# Patient Record
Sex: Female | Born: 1995 | Race: Black or African American | Hispanic: No | Marital: Single | Smoking: Former smoker
Health system: Southern US, Community
[De-identification: ages and names within clinical notes are randomized; demographics above are authoritative.]

## PROBLEM LIST (undated history)

## (undated) ENCOUNTER — Inpatient Hospital Stay (HOSPITAL_COMMUNITY): Payer: Self-pay

## (undated) DIAGNOSIS — F32A Depression, unspecified: Secondary | ICD-10-CM

## (undated) DIAGNOSIS — R87629 Unspecified abnormal cytological findings in specimens from vagina: Secondary | ICD-10-CM

## (undated) DIAGNOSIS — K5090 Crohn's disease, unspecified, without complications: Secondary | ICD-10-CM

## (undated) DIAGNOSIS — M0800 Unspecified juvenile rheumatoid arthritis of unspecified site: Secondary | ICD-10-CM

## (undated) DIAGNOSIS — O039 Complete or unspecified spontaneous abortion without complication: Secondary | ICD-10-CM

## (undated) DIAGNOSIS — K649 Unspecified hemorrhoids: Secondary | ICD-10-CM

## (undated) DIAGNOSIS — F419 Anxiety disorder, unspecified: Secondary | ICD-10-CM

## (undated) DIAGNOSIS — F329 Major depressive disorder, single episode, unspecified: Secondary | ICD-10-CM

## (undated) DIAGNOSIS — B999 Unspecified infectious disease: Secondary | ICD-10-CM

## (undated) HISTORY — DX: Depression, unspecified: F32.A

## (undated) HISTORY — DX: Anxiety disorder, unspecified: F41.9

## (undated) HISTORY — DX: Unspecified hemorrhoids: K64.9

## (undated) HISTORY — PX: TONSILLECTOMY: SUR1361

## (undated) HISTORY — DX: Major depressive disorder, single episode, unspecified: F32.9

## (undated) HISTORY — DX: Unspecified juvenile rheumatoid arthritis of unspecified site: M08.00

## (undated) HISTORY — DX: Complete or unspecified spontaneous abortion without complication: O03.9

---
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
## 2015-06-03 ENCOUNTER — Ambulatory Visit (INDEPENDENT_AMBULATORY_CARE_PROVIDER_SITE_OTHER): Payer: BC Managed Care – PPO | Admitting: Family Medicine

## 2015-06-03 ENCOUNTER — Encounter: Payer: Self-pay | Admitting: Family Medicine

## 2015-06-03 VITALS — BP 120/76 | HR 74 | Temp 98.5°F | Resp 16 | Ht 67.25 in | Wt 218.0 lb

## 2015-06-03 DIAGNOSIS — Z3201 Encounter for pregnancy test, result positive: Secondary | ICD-10-CM

## 2015-06-03 DIAGNOSIS — G4489 Other headache syndrome: Secondary | ICD-10-CM

## 2015-06-03 DIAGNOSIS — K2970 Gastritis, unspecified, without bleeding: Secondary | ICD-10-CM | POA: Diagnosis not present

## 2015-06-03 LAB — PREGNANCY, URINE: Preg Test, Ur: POSITIVE

## 2015-06-03 MED ORDER — PROMETHAZINE HCL 12.5 MG PO TABS: 12.5000 mg | ORAL_TABLET | Freq: Three times a day (TID) | ORAL | Status: DC | PRN

## 2015-06-03 MED ORDER — NORGESTIMATE-ETH ESTRADIOL 0.25-35 MG-MCG PO TABS: 1.0000 | ORAL_TABLET | Freq: Every day | ORAL | Status: DC

## 2015-06-03 MED ORDER — ESOMEPRAZOLE MAGNESIUM 20 MG PO CPDR: 20.0000 mg | DELAYED_RELEASE_CAPSULE | Freq: Every day | ORAL | Status: DC

## 2015-06-03 MED ORDER — PROMETHAZINE HCL 25 MG/ML IJ SOLN
25.0000 mg | Freq: Once | INTRAMUSCULAR | Status: AC
  Administered 2015-06-03: 25 mg via INTRAMUSCULAR

## 2015-06-03 NOTE — Patient Instructions (Addendum)
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

## 2015-06-03 NOTE — Progress Notes (Signed)
Patient ID: Deanna [name], female   DOB: [birth year], 19 y.o.   MRN: [id number]      Subjective:    Patient ID: Deanna [name], female    DOB: May 18, 1996, 19 y.o.   MRN: [id number]  Patient presents for Illness  she here with epigastric discomfort and nausea vomiting this light she is mostly vomiting stomach acid for the past 3-4 days. She denies any fever. Has had problems with acid reflux in the past. She does have a history of Crohn's disease but has not had any change in her weight no change in her bowels she denies any dysuria. She is no longer taking her Crohn's medication or her juvenile arthritis medications. She did take one tablet of ibuprofen a week ago but for some knee pain but is not taking any anti-inflammatory since then. She had a headache one day but this was after she was vomiting.  LMP- August - early   At the end of the visit patient and mother asked if she could be put on birth control she states that she is not sexually active   Review Of Systems:  GEN- denies fatigue, fever, weight loss,weakness, recent illness HEENT- denies eye drainage, change in vision, nasal discharge, CVS- denies chest pain, palpitations RESP- denies SOB, cough, wheeze ABD- + N/V, change in stools, +abd pain GU- denies dysuria, hematuria, dribbling, incontinence MSK- denies joint pain, muscle aches, injury Neuro- denies headache, dizziness, syncope, seizure activity       Objective:    BP 120/76 mmHg  Pulse 74  Temp(Src) 98.5 F (36.9 C) (Oral)  Resp 16  Ht 5' 7.25" (1.708 m)  Wt 218 lb (98.884 kg)  BMI 33.90 kg/m2  LMP 04/29/2015 (Approximate) GEN- NAD, alert and oriented x3 HEENT- PERRL, EOMI, non injected sclera, pink conjunctiva, MMM, oropharynx clear Neck- Supple, no LAD CVS- RRR, no murmur RESP-CTAB ABD-NABS,soft,mild TTP epigastric region, ND, no CVA tenderness  EXT- No edema Pulses- Radial,  2+        Assessment & Plan:      Problem List Items Addressed  This Visit    None    Visit Diagnoses    Gastritis    -  Primary    Initially concerned for gastritis, she has had acid reflux on and off in the past, given nexium but then pregnancy test returned positive, therefore advised not to take this      Relevant Medications    promethazine (PHENERGAN) injection 25 mg (Completed)    Other Relevant Orders    CBC with Differential/Platelet    Comprehensive metabolic panel    Other headache syndrome        Positive pregnancy test        After visit, pt able to leave urine sample, positive pregnancy test, obtain US can not give accurate LMP, mother advised not to give phenergan or Sprintec OCP,     Relevant Orders    Pregnancy, urine (Completed)       Note: This dictation was prepared with Dragon dictation along with smaller phrase technology. Any transcriptional errors that result from this process are unintentional.

## 2015-06-04 ENCOUNTER — Telehealth: Payer: Self-pay | Admitting: *Deleted

## 2015-06-04 ENCOUNTER — Other Ambulatory Visit: Payer: Self-pay | Admitting: *Deleted

## 2015-06-04 LAB — COMPREHENSIVE METABOLIC PANEL
ALT: 4 U/L — ABNORMAL LOW (ref 5–32)
AST: 10 U/L — ABNORMAL LOW (ref 12–32)
Albumin: 4 g/dL (ref 3.6–5.1)
Alkaline Phosphatase: 61 U/L (ref 47–176)
BUN: 9 mg/dL (ref 7–20)
CHLORIDE: 102 mmol/L (ref 98–110)
CO2: 23 mmol/L (ref 20–31)
CREATININE: 0.59 mg/dL (ref 0.50–1.00)
Calcium: 9.6 mg/dL (ref 8.9–10.4)
Glucose, Bld: 83 mg/dL (ref 70–99)
Potassium: 4.1 mmol/L (ref 3.8–5.1)
SODIUM: 136 mmol/L (ref 135–146)
TOTAL PROTEIN: 7.4 g/dL (ref 6.3–8.2)
Total Bilirubin: 0.4 mg/dL (ref 0.2–1.1)

## 2015-06-04 LAB — CBC WITH DIFFERENTIAL/PLATELET
BASOS PCT: 0 % (ref 0–1)
Basophils Absolute: 0 10*3/uL (ref 0.0–0.1)
EOS ABS: 0.1 10*3/uL (ref 0.0–0.7)
Eosinophils Relative: 1 % (ref 0–5)
HCT: 40.4 % (ref 36.0–46.0)
Hemoglobin: 13 g/dL (ref 12.0–15.0)
Lymphocytes Relative: 29 % (ref 12–46)
Lymphs Abs: 2 10*3/uL (ref 0.7–4.0)
MCH: 27.7 pg (ref 26.0–34.0)
MCHC: 32.2 g/dL (ref 30.0–36.0)
MCV: 86 fL (ref 78.0–100.0)
MPV: 10 fL (ref 8.6–12.4)
Monocytes Absolute: 0.7 10*3/uL (ref 0.1–1.0)
Monocytes Relative: 10 % (ref 3–12)
NEUTROS PCT: 60 % (ref 43–77)
Neutro Abs: 4.2 10*3/uL (ref 1.7–7.7)
PLATELETS: 328 10*3/uL (ref 150–400)
RBC: 4.7 MIL/uL (ref 3.87–5.11)
RDW: 13.7 % (ref 11.5–15.5)
WBC: 7 10*3/uL (ref 4.0–10.5)

## 2015-06-04 MED ORDER — CONCEPT DHA 53.5-38-1 MG PO CAPS: 1.0000 | ORAL_CAPSULE | Freq: Every day | ORAL | Status: DC

## 2015-06-04 NOTE — Telephone Encounter (Signed)
Pt has appt scheduled for Friday Sept 16 at [facility] [place] to pt for appt

## 2015-06-05 NOTE — Telephone Encounter (Signed)
LMTRC

## 2015-06-06 NOTE — Telephone Encounter (Signed)
Called both numbers listed lmtrc on both

## 2015-06-07 ENCOUNTER — Other Ambulatory Visit: Payer: Self-pay | Admitting: Family Medicine

## 2015-06-07 ENCOUNTER — Ambulatory Visit (HOSPITAL_COMMUNITY)
Admission: RE | Admit: 2015-06-07 | Discharge: 2015-06-07 | Disposition: A | Discharge status: Home / Self Care | Payer: BC Managed Care – PPO | Source: Ambulatory Visit | Attending: Family Medicine | Admitting: Family Medicine

## 2015-06-07 ENCOUNTER — Ambulatory Visit (HOSPITAL_COMMUNITY): Payer: BC Managed Care – PPO

## 2015-06-07 DIAGNOSIS — Z3A01 Less than 8 weeks gestation of pregnancy: Secondary | ICD-10-CM | POA: Diagnosis not present

## 2015-06-07 DIAGNOSIS — O30041 Twin pregnancy, dichorionic/diamniotic, first trimester: Secondary | ICD-10-CM | POA: Diagnosis present

## 2015-06-07 DIAGNOSIS — Z3201 Encounter for pregnancy test, result positive: Secondary | ICD-10-CM

## 2015-06-07 DIAGNOSIS — O208 Other hemorrhage in early pregnancy: Secondary | ICD-10-CM | POA: Insufficient documentation

## 2015-07-17 ENCOUNTER — Encounter: Payer: Self-pay | Admitting: Family Medicine

## 2015-07-17 ENCOUNTER — Other Ambulatory Visit: Payer: Self-pay | Admitting: Family Medicine

## 2015-07-17 ENCOUNTER — Ambulatory Visit (INDEPENDENT_AMBULATORY_CARE_PROVIDER_SITE_OTHER): Payer: BC Managed Care – PPO | Admitting: Family Medicine

## 2015-07-17 VITALS — BP 132/78 | HR 80 | Temp 98.7°F | Resp 14 | Ht 67.0 in | Wt 224.0 lb

## 2015-07-17 DIAGNOSIS — Z0000 Encounter for general adult medical examination without abnormal findings: Secondary | ICD-10-CM | POA: Diagnosis not present

## 2015-07-17 DIAGNOSIS — Z23 Encounter for immunization: Secondary | ICD-10-CM | POA: Diagnosis not present

## 2015-07-17 DIAGNOSIS — Z124 Encounter for screening for malignant neoplasm of cervix: Secondary | ICD-10-CM

## 2015-07-17 DIAGNOSIS — Z30011 Encounter for initial prescription of contraceptive pills: Secondary | ICD-10-CM

## 2015-07-17 DIAGNOSIS — Z113 Encounter for screening for infections with a predominantly sexual mode of transmission: Secondary | ICD-10-CM | POA: Diagnosis not present

## 2015-07-17 LAB — PREGNANCY, URINE: PREG TEST UR: NEGATIVE

## 2015-07-17 LAB — WET PREP FOR TRICH, YEAST, CLUE
TRICH WET PREP: NONE SEEN
Yeast Wet Prep HPF POC: NONE SEEN

## 2015-07-17 MED ORDER — NORGESTIMATE-ETH ESTRADIOL 0.25-35 MG-MCG PO TABS: 1.0000 | ORAL_TABLET | Freq: Every day | ORAL | Status: DC

## 2015-07-17 NOTE — Progress Notes (Signed)
Patient ID: Deanna [name], female   DOB: 1996/08/04, 19 y.o.   MRN: [id number]   Subjective:    Patient ID: Deanna [name], female    DOB: Oct 20, 1995, 19 y.o.   MRN: [id number]  Patient presents for CPE  patient here for complete physical exam. Her last visit she was found to be pregnant with twins. She had an abortion at a local clinic. She was given oral medication for this procedure. She did not have her follow-up examination she missed this. After she bled from the abortion treatment she did have a menstrual cycle which came on October 9. He would like to start birth control she has been sexually active in the past month with different partners. She also requests STD check. She has been using condoms.  She is currently finishing up her associates degree and will transfer to [employer] next fall. Her immunizations were reviewed she is due for hepatitis A her second HPV and flu shot.  No illicit drug use.No ETOH    Review Of Systems:  GEN- denies fatigue, fever, weight loss,weakness, recent illness HEENT- denies eye drainage, change in vision, nasal discharge, CVS- denies chest pain, palpitations RESP- denies SOB, cough, wheeze ABD- denies N/V, change in stools, abd pain GU- denies dysuria, hematuria, dribbling, incontinence MSK- denies joint pain, muscle aches, injury Neuro- denies headache, dizziness, syncope, seizure activity       Objective:    BP 132/78 mmHg  Pulse 80  Temp(Src) 98.7 F (37.1 C) (Oral)  Resp 14  Ht 5' 7"  (1.702 m)  Wt 224 lb (101.606 kg)  BMI 35.08 kg/m2 GEN- NAD, alert and oriented x3 HEENT- PERRL, EOMI, non injected sclera, pink conjunctiva, MMM, oropharynx clear Neck- Supple, no thyromegaly CVS- RRR, no murmur RESP-CTAB ABD-NABS,soft,NT,ND GU- normal external genitalia, vaginal mucosa pink and moist, cervix visualized no growth, no blood form os, minimal thin clear discharge, no CMT, no ovarian masses, uterus normal size EXT- No  edema Pulses- Radial, DP- 2+  Upreg- NEGATIVE      Assessment & Plan:      Problem List Items Addressed This Visit    None    Visit Diagnoses    Routine general medical examination at a health care facility    -  Primary    CPE done, PAP Smear done, s/p abortion, cultures taken, will start OCP , HPV #2, Hep A #2 given Discussed high risk sexual behavior, use of condoms    Relevant Medications    norgestimate-ethinyl estradiol (SPRINTEC 28) 0.25-35 MG-MCG tablet    Other Relevant Orders    GC/Chlamydia Probe Amp    WET PREP FOR TRICH, YEAST, CLUE (Completed)    PAP, ThinPrep ASCUS Rflx HPV Rflx Type    HIV antibody (with reflex)    RPR    Pregnancy, urine (Completed)    Flu Vaccine QUAD 36+ mos PF IM (Fluarix & Fluzone Quad PF) (Completed)    Hepatitis A vaccine pediatric / adolescent 2 dose IM (Completed)    HPV 9-valent vaccine,Recombinat (Gardasil 9) (Completed)    Cervical cancer screening        Relevant Medications    norgestimate-ethinyl estradiol (SPRINTEC 28) 0.25-35 MG-MCG tablet    Other Relevant Orders    GC/Chlamydia Probe Amp    WET PREP FOR TRICH, YEAST, CLUE (Completed)    PAP, ThinPrep ASCUS Rflx HPV Rflx Type    HIV antibody (with reflex)    RPR    Pregnancy, urine (Completed)  Flu Vaccine QUAD 36+ mos PF IM (Fluarix & Fluzone Quad PF) (Completed)    Hepatitis A vaccine pediatric / adolescent 2 dose IM (Completed)    HPV 9-valent vaccine,Recombinat (Gardasil 9) (Completed)    Screen for STD (sexually transmitted disease)        Relevant Medications    norgestimate-ethinyl estradiol (SPRINTEC 28) 0.25-35 MG-MCG tablet    Other Relevant Orders    GC/Chlamydia Probe Amp    WET PREP FOR TRICH, YEAST, CLUE (Completed)    PAP, ThinPrep ASCUS Rflx HPV Rflx Type    HIV antibody (with reflex)    RPR    Pregnancy, urine (Completed)    Flu Vaccine QUAD 36+ mos PF IM (Fluarix & Fluzone Quad PF) (Completed)    Hepatitis A vaccine pediatric / adolescent 2  dose IM (Completed)    HPV 9-valent vaccine,Recombinat (Gardasil 9) (Completed)    Encounter for initial prescription of contraceptive pills        Relevant Medications    norgestimate-ethinyl estradiol (SPRINTEC 28) 0.25-35 MG-MCG tablet    Other Relevant Orders    GC/Chlamydia Probe Amp    WET PREP FOR TRICH, YEAST, CLUE (Completed)    PAP, ThinPrep ASCUS Rflx HPV Rflx Type    HIV antibody (with reflex)    RPR    Pregnancy, urine (Completed)    Flu Vaccine QUAD 36+ mos PF IM (Fluarix & Fluzone Quad PF) (Completed)    Hepatitis A vaccine pediatric / adolescent 2 dose IM (Completed)    HPV 9-valent vaccine,Recombinat (Gardasil 9) (Completed)    Need for prophylactic vaccination and inoculation against influenza        Relevant Medications    norgestimate-ethinyl estradiol (SPRINTEC 28) 0.25-35 MG-MCG tablet    Other Relevant Orders    GC/Chlamydia Probe Amp    WET PREP FOR TRICH, YEAST, CLUE (Completed)    PAP, ThinPrep ASCUS Rflx HPV Rflx Type    HIV antibody (with reflex)    RPR    Pregnancy, urine (Completed)    Flu Vaccine QUAD 36+ mos PF IM (Fluarix & Fluzone Quad PF) (Completed)    Hepatitis A vaccine pediatric / adolescent 2 dose IM (Completed)    HPV 9-valent vaccine,Recombinat (Gardasil 9) (Completed)    Need for prophylactic vaccination and inoculation against viral hepatitis        Relevant Medications    norgestimate-ethinyl estradiol (SPRINTEC 28) 0.25-35 MG-MCG tablet    Other Relevant Orders    GC/Chlamydia Probe Amp    WET PREP FOR TRICH, YEAST, CLUE (Completed)    PAP, ThinPrep ASCUS Rflx HPV Rflx Type    HIV antibody (with reflex)    RPR    Pregnancy, urine (Completed)    Flu Vaccine QUAD 36+ mos PF IM (Fluarix & Fluzone Quad PF) (Completed)    Hepatitis A vaccine pediatric / adolescent 2 dose IM (Completed)    HPV 9-valent vaccine,Recombinat (Gardasil 9) (Completed)    Need for prophylactic vaccination and inoculation against single disease        Relevant  Medications    norgestimate-ethinyl estradiol (SPRINTEC 28) 0.25-35 MG-MCG tablet    Other Relevant Orders    GC/Chlamydia Probe Amp    WET PREP FOR TRICH, YEAST, CLUE (Completed)    PAP, ThinPrep ASCUS Rflx HPV Rflx Type    HIV antibody (with reflex)    RPR    Pregnancy, urine (Completed)    Flu Vaccine QUAD 36+ mos PF IM (Fluarix & Fluzone Quad PF) (Completed)  Hepatitis A vaccine pediatric / adolescent 2 dose IM (Completed)    HPV 9-valent vaccine,Recombinat (Gardasil 9) (Completed)       Note: This dictation was prepared with Dragon dictation along with smaller phrase technology. Any transcriptional errors that result from this process are unintentional.

## 2015-07-17 NOTE — Patient Instructions (Signed)
We will call with lab results Start birth control Hepatitis shot, HPV and flu shot given F/U as needed or in 1 year

## 2015-07-18 LAB — GC/CHLAMYDIA PROBE AMP
CT PROBE, AMP APTIMA: NEGATIVE
GC PROBE AMP APTIMA: NEGATIVE

## 2015-07-18 LAB — HIV ANTIBODY (ROUTINE TESTING W REFLEX): HIV: NONREACTIVE

## 2015-07-18 LAB — RPR

## 2015-07-19 LAB — PAP THINPREP ASCUS RFLX HPV RFLX TYPE

## 2015-07-24 LAB — HUMAN PAPILLOMAVIRUS, HIGH RISK: HPV DNA High Risk: DETECTED — AB

## 2016-01-08 ENCOUNTER — Ambulatory Visit
Admission: RE | Admit: 2016-01-08 | Discharge: 2016-01-08 | Disposition: A | Discharge status: Home / Self Care | Payer: BC Managed Care – PPO | Source: Ambulatory Visit | Attending: Family Medicine | Admitting: Family Medicine

## 2016-01-08 ENCOUNTER — Encounter: Payer: Self-pay | Admitting: Family Medicine

## 2016-01-08 ENCOUNTER — Ambulatory Visit (INDEPENDENT_AMBULATORY_CARE_PROVIDER_SITE_OTHER): Payer: BC Managed Care – PPO | Admitting: Family Medicine

## 2016-01-08 VITALS — BP 118/62 | HR 78 | Temp 99.3°F | Resp 14 | Ht 67.0 in | Wt 224.0 lb

## 2016-01-08 DIAGNOSIS — M25461 Effusion, right knee: Secondary | ICD-10-CM | POA: Diagnosis not present

## 2016-01-08 DIAGNOSIS — M25561 Pain in right knee: Secondary | ICD-10-CM | POA: Diagnosis not present

## 2016-01-08 MED ORDER — DICLOFENAC SODIUM 75 MG PO TBEC: 75.0000 mg | DELAYED_RELEASE_TABLET | Freq: Two times a day (BID) | ORAL | Status: DC

## 2016-01-08 NOTE — Progress Notes (Signed)
Patient ID: AIREANNA LUELLEN, female   DOB: [birth year], 20 y.o.   MRN: [id number]   Subjective:    Patient ID: Florentina Addison, female    DOB: Sep 13, 1996, 20 y.o.   MRN: [id number]  Patient presents for R Knee Pain   RIght knee pain x 1 day, working at [employer] , does a lot of standing and walking, no injury Noticed swelling when she got off work Now pain at back of knee And on the top of her knee. He does recall having some knee pain a few weeks ago but at that time did not have any specific injury either. She denies any fever no recent symptoms. Took TYlenol  for pain.      Review Of Systems:  GEN- denies fatigue, fever, weight loss,weakness, recent illness HEENT- denies eye drainage, change in vision, nasal discharge, CVS- denies chest pain, palpitations RESP- denies SOB, cough, wheeze ABD- denies N/V, change in stools, abd pain GU- denies dysuria, hematuria, dribbling, incontinence MSK- + joint pain, muscle aches, injury Neuro- denies headache, dizziness, syncope, seizure activity       Objective:    BP 118/62 mmHg  Pulse 78  Temp(Src) 99.3 F (37.4 C) (Oral)  Resp 14  Ht 5' 7"  (1.702 m)  Wt 224 lb (101.606 kg)  BMI 35.08 kg/m2 GEN- NAD, alert and oriented x3 MSK- Right knee- + effusion, no erythenma, no warmth, TTP Ant knee and inferior patella, NT in popliteal region, neg lachmans, ligaments in tact, pain with flexion of knee, unable to completely extend.  Ankle FROM no effusion Left knee- normal inspection, FROM         Assessment & Plan:      Problem List Items Addressed This Visit    None    Visit Diagnoses    Knee pain, acute, right    -  Primary    knee pain , no specific injury, xray shows effusion otherwise normal joint. Treat with diclofenac once a day, as she has history of inflammatory bowel disorder.ICE and elevate  If swelling does not go down send to Ortho ? Sprain of knee    Relevant Orders    DG Knee Complete 4 Views Right (Completed)     Knee effusion, right        Relevant Orders    DG Knee Complete 4 Views Right (Completed)       Note: This dictation was prepared with Dragon dictation along with smaller phrase technology. Any transcriptional errors that result from this process are unintentional.

## 2016-01-08 NOTE — Patient Instructions (Addendum)
Ice knee and elevate  You can wear a brace with knee cut out  Take diclofenac twice a day with food for 2 weeks Get xray done  [address]  [facility]) F/U pending results

## 2016-01-20 ENCOUNTER — Ambulatory Visit: Payer: Self-pay | Admitting: Family Medicine

## 2016-01-22 ENCOUNTER — Encounter: Payer: Self-pay | Admitting: Family Medicine

## 2016-01-22 ENCOUNTER — Ambulatory Visit (INDEPENDENT_AMBULATORY_CARE_PROVIDER_SITE_OTHER): Payer: BC Managed Care – PPO | Admitting: Family Medicine

## 2016-01-22 VITALS — BP 128/68 | HR 72 | Temp 99.1°F | Resp 18 | Ht 67.0 in | Wt 228.0 lb

## 2016-01-22 DIAGNOSIS — Z309 Encounter for contraceptive management, unspecified: Secondary | ICD-10-CM | POA: Insufficient documentation

## 2016-01-22 DIAGNOSIS — Z3041 Encounter for surveillance of contraceptive pills: Secondary | ICD-10-CM

## 2016-01-22 DIAGNOSIS — Z3200 Encounter for pregnancy test, result unknown: Secondary | ICD-10-CM | POA: Diagnosis not present

## 2016-01-22 NOTE — Patient Instructions (Signed)
Try Pamprin or Midol  Continue birth control F/U as needed

## 2016-01-22 NOTE — Progress Notes (Signed)
Patient ID: LORELEE [name], female   DOB: 07/08/1996, 20 y.o.   MRN: [id number]    Subjective:    Patient ID: Deanna [name], female    DOB: 19-Feb-1996, 20 y.o.   MRN: [id number]  Patient presents for Screening Pregnancy Test Patient here for pregnancy test. She is currently on oral contraceptives  she missed some days of her birth control and has been sexually active. Her cycle did come on 4/31, initially it was light now heavy,s he also has cramping and she doesn't usually have cramping.  Note she did have a positive pregnancy test back in September 2016 with twin gestation she had an abortion at that time. Denies vaginal discharge, UTI symptoms    Review Of Systems:  GEN- denies fatigue, fever, weight loss,weakness, recent illness HEENT- denies eye drainage, change in vision, nasal discharge, CVS- denies chest pain, palpitations RESP- denies SOB, cough, wheeze ABD- denies N/V, change in stools, abd pain GU- denies dysuria, hematuria, dribbling, incontinence MSK- denies joint pain, muscle aches, injury Neuro- denies headache, dizziness, syncope, seizure activity       Objective:    BP 128/68 mmHg  Pulse 72  Temp(Src) 99.1 F (37.3 C) (Oral)  Resp 18  Ht 5' 7"  (1.702 m)  Wt 228 lb (103.42 kg)  BMI 35.70 kg/m2  LMP 01/19/2016 GEN- NAD, alert and oriented x3 ABD-NABS,soft,NT,ND         Assessment & Plan:      Problem List Items Addressed This Visit    Contraceptive management    Other Visit Diagnoses    Encounter for pregnancy test    -  Primary    Upreg neg, pt on menses, discussed proper use of BP, she can double up on missed days. Okay to use Pamprin as needed    Relevant Orders    Pregnancy, urine       Note: This dictation was prepared with Dragon dictation along with smaller phrase technology. Any transcriptional errors that result from this process are unintentional.

## 2016-03-23 ENCOUNTER — Ambulatory Visit: Payer: BC Managed Care – PPO | Admitting: Family Medicine

## 2016-03-25 ENCOUNTER — Encounter: Payer: Self-pay | Admitting: Family Medicine

## 2016-03-25 ENCOUNTER — Ambulatory Visit (INDEPENDENT_AMBULATORY_CARE_PROVIDER_SITE_OTHER): Payer: BC Managed Care – PPO | Admitting: Family Medicine

## 2016-03-25 VITALS — BP 130/72 | HR 88 | Temp 98.1°F | Resp 14 | Ht 67.0 in | Wt 226.0 lb

## 2016-03-25 DIAGNOSIS — Z3201 Encounter for pregnancy test, result positive: Secondary | ICD-10-CM

## 2016-03-25 LAB — PREGNANCY, URINE: Preg Test, Ur: POSITIVE — AB

## 2016-03-25 MED ORDER — CONCEPT DHA 53.5-38-1 MG PO CAPS: ORAL_CAPSULE | ORAL | Status: DC

## 2016-03-25 NOTE — Patient Instructions (Signed)
Referral to OB/GYN  B6 for nausea Plenty of water  Avoid- Spicey, avoid fried foods, no hotdogs, avoid deli meats, only whitefish , cooked foods only  F/U as needed

## 2016-03-25 NOTE — Progress Notes (Signed)
Patient ID: Deanna [name], female   DOB: [birth year], 20 y.o.   MRN: [id number]   Subjective:    Patient ID: Deanna [name], female    DOB: 28-Jul-1996, 20 y.o.   MRN: [id number]  Patient presents for Pregnancy Test and GI Issues  Patient here for positive pregnancy test. She has history of Crohn's disease which she's not had any difficulties with the past 3 years. At her last visit we had started her on birth control but she did not take it. She states that this pregnancy was planned between her and her boyfriend she is 61 he is 49. She's been taking over-the-counter prenatal vitamin she has been very nauseous and had a few episodes of emesis. Her last menstrual period was around May 20 or 21st she did have some spotting at the end of June but it was not a true period   Review Of Systems:  GEN- denies fatigue, fever, weight loss,weakness, recent illness HEENT- denies eye drainage, change in vision, nasal discharge, CVS- denies chest pain, palpitations RESP- denies SOB, cough, wheeze ABD-+N/V, change in stools, abd pain GU- denies dysuria, hematuria, dribbling, incontinence MSK- denies joint pain, muscle aches, injury Neuro- denies headache, dizziness, syncope, seizure activity       Objective:    BP 130/72 mmHg  Pulse 88  Temp(Src) 98.1 F (36.7 C) (Oral)  Resp 14  Ht 5' 7"  (1.702 m)  Wt 226 lb (102.513 kg)  BMI 35.39 kg/m2 GEN- NAD, alert and oriented x3 HEENT- PERRL, EOMI, non injected sclera, pink conjunctiva, MMM, oropharynx clear Neck- Supple, no thyromegaly CVS- RRR, no murmur RESP-CTAB EXT-trace edema Pulses- Radial  2+        Assessment & Plan:      Problem List Items Addressed This Visit    None    Visit Diagnoses    Positive pregnancy test    -  Primary    confrimed pregnancy, discussed PNV start Concept, refer to OB, B6 for nausea, water Around 6-[redacted] weeks pregnant    Relevant Orders    Pregnancy, urine (Completed)       Note: This dictation  was prepared with Dragon dictation along with smaller phrase technology. Any transcriptional errors that result from this process are unintentional.

## 2016-03-27 LAB — PREGNANCY, URINE: Preg Test, Ur: NEGATIVE

## 2016-05-05 ENCOUNTER — Encounter: Payer: Self-pay | Admitting: Medical

## 2016-05-05 ENCOUNTER — Ambulatory Visit (INDEPENDENT_AMBULATORY_CARE_PROVIDER_SITE_OTHER): Payer: BC Managed Care – PPO | Admitting: Medical

## 2016-05-05 VITALS — BP 113/75 | HR 112 | Wt 217.2 lb

## 2016-05-05 DIAGNOSIS — Z3491 Encounter for supervision of normal pregnancy, unspecified, first trimester: Secondary | ICD-10-CM

## 2016-05-05 DIAGNOSIS — Z3490 Encounter for supervision of normal pregnancy, unspecified, unspecified trimester: Secondary | ICD-10-CM | POA: Insufficient documentation

## 2016-05-05 DIAGNOSIS — O219 Vomiting of pregnancy, unspecified: Secondary | ICD-10-CM

## 2016-05-05 DIAGNOSIS — Z3481 Encounter for supervision of other normal pregnancy, first trimester: Secondary | ICD-10-CM

## 2016-05-05 LAB — POCT URINALYSIS DIP (DEVICE)
GLUCOSE, UA: NEGATIVE mg/dL
HGB URINE DIPSTICK: NEGATIVE
Nitrite: NEGATIVE
PROTEIN: 30 mg/dL — AB
SPECIFIC GRAVITY, URINE: 1.025 (ref 1.005–1.030)
UROBILINOGEN UA: 1 mg/dL (ref 0.0–1.0)
pH: 6 (ref 5.0–8.0)

## 2016-05-05 MED ORDER — DOXYLAMINE-PYRIDOXINE 10-10 MG PO TBEC: DELAYED_RELEASE_TABLET | ORAL | 1 refills | Status: DC

## 2016-05-05 NOTE — Progress Notes (Signed)
Subjective:  Deanna [name] is a 20 y.o. G2P0010 at 81w2dbeing seen today for initial prenatal visit.  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  Patient reports no complaints.   . Vag. Bleeding: None.  Movement: Absent. Denies leaking of fluid.   The following portions of the patient's history were reviewed and updated as appropriate: allergies, current medications, past family history, past medical history, past social history, past surgical history and problem list. Problem list updated.  Objective:   Vitals:   05/05/16 1439  BP: 113/75  Pulse: (!) 112  Weight: 217 lb 3.2 oz (98.5 kg)    Fetal Status: Fetal Heart Rate (bpm): 153   Movement: Absent     General:  Alert, oriented and cooperative. Patient is in no acute distress.  Skin: Skin is warm and dry. No rash noted.   Cardiovascular: Normal heart rate noted  Respiratory: Normal respiratory effort, no problems with respiration noted  Abdomen: Soft, gravid, appropriate for gestational age. Pain/Pressure: Absent     Pelvic:  Cervical exam deferred        Extremities: Normal range of motion.  Edema: None  Mental Status: Normal mood and affect. Normal behavior. Normal judgment and thought content.   Urinalysis: Urine Protein: 1+ Urine Glucose: Negative  Assessment and Plan:  Pregnancy: G2P0010 at 165w2d1. Supervision of normal pregnancy in first trimester - Culture, OB Urine - Prenatal Profile - Hemoglobinopathy Evaluation - [place] Fetal Nuchal Translucency; scheduled  2. Nausea and vomiting in pregnancy prior to [redacted] weeks gestation - Rx for Diclegis sent to patient's pharmacy  3. Crohn's disease - Patient denies symptoms of flare at this time, not currently on any medication for management, will have her  discuss with MD at next visit  4. JRA - patient states intermittent mild joint pain, denies any current JRA specific medications, will have her discuss this with MD at next visit as well  first trimester warning signs/ symptoms and general obstetric precautions including but not limited to vaginal bleeding, contractions, leaking of fluid and fetal movement were reviewed in detail with the patient. Please refer to After Visit Summary for other counseling recommendations.  Return in about 4 weeks (around 06/02/2016) for HROB with MD.   JuLuvenia ReddenPA-C

## 2016-05-05 NOTE — Patient Instructions (Signed)

## 2016-05-06 LAB — PRENATAL PROFILE (SOLSTAS)
Antibody Screen: NEGATIVE
BASOS ABS: 0 {cells}/uL (ref 0–200)
Basophils Relative: 0 %
EOS ABS: 65 {cells}/uL (ref 15–500)
EOS PCT: 1 %
HCT: 39.2 % (ref 35.0–45.0)
HEMOGLOBIN: 12.7 g/dL (ref 11.7–15.5)
HEP B S AG: NEGATIVE
HIV: NONREACTIVE
LYMPHS ABS: 1885 {cells}/uL (ref 850–3900)
Lymphocytes Relative: 29 %
MCH: 27.9 pg (ref 27.0–33.0)
MCHC: 32.4 g/dL (ref 32.0–36.0)
MCV: 86 fL (ref 80.0–100.0)
MONO ABS: 585 {cells}/uL (ref 200–950)
MPV: 10.7 fL (ref 7.5–12.5)
Monocytes Relative: 9 %
NEUTROS ABS: 3965 {cells}/uL (ref 1500–7800)
Neutrophils Relative %: 61 %
PLATELETS: 287 10*3/uL (ref 140–400)
RBC: 4.56 MIL/uL (ref 3.80–5.10)
RDW: 14.4 % (ref 11.0–15.0)
RUBELLA: 1.59 {index} — AB (ref <0.90)
Rh Type: POSITIVE
WBC: 6.5 10*3/uL (ref 3.8–10.8)

## 2016-05-07 LAB — HEMOGLOBINOPATHY EVALUATION
HEMATOCRIT: 39.2 % (ref 35.0–45.0)
HEMOGLOBIN: 12.7 g/dL (ref 11.7–15.5)
HGB A: 96.5 % (ref >96.0)
Hgb A2 Quant: 2.5 % (ref 1.8–3.5)
Hgb F Quant: <1 % (ref <2.0)
MCH: 27.9 pg (ref 27.0–33.0)
MCV: 86 fL (ref 80.0–100.0)
RBC: 4.56 MIL/uL (ref 3.80–5.10)
RDW: 14.4 % (ref 11.0–15.0)

## 2016-05-12 ENCOUNTER — Encounter (HOSPITAL_COMMUNITY): Payer: Self-pay | Admitting: Medical

## 2016-05-14 ENCOUNTER — Telehealth: Payer: Self-pay | Admitting: *Deleted

## 2016-05-14 NOTE — Telephone Encounter (Signed)
Received call from patient.   Reports that she thinks that she has UTI and would like ABTx to be called to pharmacy.   Call placed to patient to advise that UA will be required for treatment, and since she is currently pregnant, she should go to OB since they are more specialized with what ABTx will be safe for her pregnancy.   Call placed to patient. [name].

## 2016-05-15 NOTE — Telephone Encounter (Signed)
Multiple calls placed to patient with no answer and no return call.   Message to be closed.  

## 2016-05-18 ENCOUNTER — Emergency Department (HOSPITAL_COMMUNITY)
Admission: EM | Admit: 2016-05-18 | Discharge: 2016-05-19 | Disposition: A | Discharge status: Home / Self Care | Payer: BC Managed Care – PPO | Attending: Emergency Medicine | Admitting: Emergency Medicine

## 2016-05-18 ENCOUNTER — Encounter (HOSPITAL_COMMUNITY): Payer: Self-pay | Admitting: Emergency Medicine

## 2016-05-18 DIAGNOSIS — O2342 Unspecified infection of urinary tract in pregnancy, second trimester: Secondary | ICD-10-CM | POA: Insufficient documentation

## 2016-05-18 DIAGNOSIS — N898 Other specified noninflammatory disorders of vagina: Secondary | ICD-10-CM | POA: Insufficient documentation

## 2016-05-18 DIAGNOSIS — O4412 Placenta previa with hemorrhage, second trimester: Secondary | ICD-10-CM | POA: Insufficient documentation

## 2016-05-18 DIAGNOSIS — O208 Other hemorrhage in early pregnancy: Secondary | ICD-10-CM | POA: Diagnosis not present

## 2016-05-18 DIAGNOSIS — Z3A14 14 weeks gestation of pregnancy: Secondary | ICD-10-CM | POA: Insufficient documentation

## 2016-05-18 DIAGNOSIS — N920 Excessive and frequent menstruation with regular cycle: Secondary | ICD-10-CM

## 2016-05-18 DIAGNOSIS — O4402 Placenta previa specified as without hemorrhage, second trimester: Secondary | ICD-10-CM

## 2016-05-18 LAB — URINE MICROSCOPIC-ADD ON

## 2016-05-18 LAB — URINALYSIS, ROUTINE W REFLEX MICROSCOPIC
GLUCOSE, UA: NEGATIVE mg/dL
Ketones, ur: 15 mg/dL — AB
Nitrite: POSITIVE — AB
PH: 6 (ref 5.0–8.0)
PROTEIN: 30 mg/dL — AB
Specific Gravity, Urine: 1.026 (ref 1.005–1.030)

## 2016-05-18 LAB — HCG, QUANTITATIVE, PREGNANCY: HCG, BETA CHAIN, QUANT, S: 47690 m[IU]/mL — AB (ref <5)

## 2016-05-18 MED ORDER — CEFTRIAXONE SODIUM 1 G IJ SOLR
1.0000 g | Freq: Once | INTRAMUSCULAR | Status: AC
  Administered 2016-05-19: 1 g via INTRAVENOUS
  Filled 2016-05-18: qty 10

## 2016-05-18 MED ORDER — SODIUM CHLORIDE 0.9 % IV BOLUS (SEPSIS)
1000.0000 mL | Freq: Once | INTRAVENOUS | Status: AC
Start: 2016-05-19 — End: 2016-05-19
  Administered 2016-05-19: 1000 mL via INTRAVENOUS

## 2016-05-18 NOTE — ED Provider Notes (Signed)
[facility] DEPT Provider Note   CSN: [id number] Arrival date & time: 05/18/16  1728    History   Chief Complaint Chief Complaint  Patient presents with  . Recurrent UTI  . Vaginal Bleeding    HPI Deanna [name] is a 20 y.o. female.  20 year old female presents to the emergency department for evaluation of dysuria. She reports that symptoms began last week. She was drinking cranberry juice often which improved her symptoms. She reports noticing onset of dysuria again today. This has been accompanied by urinary frequency and urgency. She reports some burning discomfort in her suprapubic abdomen. She has taken Tylenol for symptoms with mild improvement. She also reports noticing some vaginal spotting today. This was concerning to the patient because she is pregnant. She believes that she is approximately [redacted] weeks pregnant. She is followed by [facility] outpatient clinic. Patient denies any fever, nausea, vomiting. No history of abdominal surgeries.     Past Medical History:  Diagnosis Date  . 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 Inflammatory bowel disease    chrohn's disease    Patient Active Problem List   Diagnosis Date Noted  . Supervision of normal pregnancy in first trimester 05/05/2016  . Keloid 09/12/2013  . Acne vulgaris 09/12/2013  . 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 Inflammatory bowel disease     History reviewed. No pertinent surgical history.  OB History    Gravida Para Term Preterm AB Living   2 0 0 0 1 0   SAB TAB Ectopic Multiple Live Births   0 1 0 0 0       Home Medications    Prior to Admission medications   Medication Sig Start Date End Date Taking? Authorizing Provider  cephALEXin (KEFLEX) 500 MG capsule Take 1 capsule (500 mg total) by mouth 2 (two) times daily. 05/19/16   Antonietta Breach, PA-C  Doxylamine-Pyridoxine (DICLEGIS) 10-10 MG TBEC Take 2 tabs q hs, if symptoms persist after 3 days add 1 tab q am, if symptoms persist after  3 days add 1 tab q pm 05/05/16   Luvenia Redden, PA-C  Prenat-FeFum-FePo-FA-Omega 3 (CONCEPT DHA) 53.5-38-1 MG CAPS tAKE 1 DAILY 03/25/16   [name], MD  Prenatal Vit-Fe Fumarate-FA (MULTIVITAMIN-PRENATAL) 27-0.8 MG TABS tablet Take 1 tablet by mouth daily at 12 noon.    Historical Provider, MD    Family History Family History  Problem Relation Age of Onset  . Diabetes Maternal Grandmother     Social History Social History  Substance Use Topics  . Smoking status: Never Smoker  . Smokeless tobacco: Never Used  . Alcohol use No     Allergies   Review of patient's allergies indicates no known allergies.   Review of Systems Review of Systems  Constitutional: Negative for fever.  Gastrointestinal: Positive for abdominal pain. Negative for nausea and vomiting.  Genitourinary: Positive for dysuria, frequency, urgency and vaginal bleeding.  Ten systems reviewed and are negative for acute change, except as noted in the HPI.     Physical Exam Updated Vital Signs BP 122/64   Pulse 89   Temp 98.7 F (37.1 C)   Resp 20   Ht 5' 7"  (1.702 m)   Wt 98.4 kg Comment: patient states she is 4 months pregnant  LMP 02/16/2016 Comment: irregular  SpO2 100%   BMI 33.99 kg/m   Physical Exam  Constitutional: She is oriented to person, place, and time. She appears well-developed and well-nourished. No distress.  Nontoxic  appearing and in no distress.  HENT:  Head: Normocephalic and atraumatic.  Eyes: Conjunctivae and EOM are normal. No scleral icterus.  Neck: Normal range of motion.  Cardiovascular: Regular rhythm and intact distal pulses.   Tachycardic  Pulmonary/Chest: Effort normal. No respiratory distress.  Respirations even and unlabored  Abdominal: Soft. She exhibits no distension. There is tenderness. There is no rebound and no guarding.  Soft abdomen with mild suprapubic TTP. No masses. No peritoneal signs.  Genitourinary: There is no rash, tenderness or lesion on the  right labia. There is no rash, tenderness or lesion on the left labia. Uterus is not tender. Cervix exhibits no motion tenderness and no friability. Right adnexum displays tenderness (mild). Left adnexum displays no tenderness. Vaginal discharge (moderate white, thin) found.  Musculoskeletal: Normal range of motion.  Neurological: She is alert and oriented to person, place, and time.  Skin: Skin is warm and dry. No rash noted. She is not diaphoretic. No erythema. No pallor.  Psychiatric: She has a normal mood and affect. Her behavior is normal.  Nursing note and vitals reviewed.    ED Treatments / Results  Labs (all labs ordered are listed, but only abnormal results are displayed) Labs Reviewed  WET PREP, GENITAL - Abnormal; Notable for the following:       Result Value   Clue Cells Wet Prep HPF POC PRESENT (*)    WBC, Wet Prep HPF POC MODERATE (*)    All other components within normal limits  HCG, QUANTITATIVE, PREGNANCY - Abnormal; Notable for the following:    hCG, Beta Chain, Quant, S 47,690 (*)    All other components within normal limits  URINALYSIS, ROUTINE W REFLEX MICROSCOPIC (NOT AT [facility]) - Abnormal; Notable for the following:    Color, Urine AMBER (*)    APPearance TURBID (*)    Hgb urine dipstick LARGE (*)    Bilirubin Urine SMALL (*)    Ketones, ur 15 (*)    Protein, ur 30 (*)    Nitrite POSITIVE (*)    Leukocytes, UA LARGE (*)    All other components within normal limits  URINE MICROSCOPIC-ADD ON - Abnormal; Notable for the following:    Squamous Epithelial / LPF 6-30 (*)    Bacteria, UA MANY (*)    Crystals CA OXALATE CRYSTALS (*)    All other components within normal limits  URINE CULTURE  ABO/RH  GC/CHLAMYDIA PROBE AMP ([place]) NOT AT [facility]    EKG  EKG Interpretation None       Radiology US Ob Limited  Result Date: 05/19/2016 CLINICAL DATA:  Spotting for 2 days. EXAM: LIMITED OBSTETRIC ULTRASOUND FINDINGS: Number of Fetuses:  1 Heart Rate:  150  bpm Movement:  Yes Presentation: Variable Placental Location: Anterior Previa: Complete previa, with blood around the inferior margin of the placenta. Amniotic Fluid (Subjective):  Within normal limits. BPD:  2.65cm 14w  5d MATERNAL FINDINGS: Cervix:  Appears closed. Uterus/Adnexae:  No abnormality visualized. IMPRESSION: The anterior placenta extends across the internal cervical os, consistent with complete previa. There is hemorrhage at the lower edge of the placenta. These results were called by telephone at the time of interpretation on 05/19/2016 at 12:53 am to the ED physician. This exam is performed on an emergent basis and does not comprehensively evaluate fetal size, dating, or anatomy; follow-up complete OB US should be considered if further fetal assessment is warranted. Electronically Signed   By: Andreas Newport M.D.   On: 05/19/2016 00:54  Procedures Procedures (including critical care time)  Medications Ordered in ED Medications  sodium chloride 0.9 % bolus 1,000 mL (1,000 mLs Intravenous New Bag/Given 05/19/16 0009)  cefTRIAXone (ROCEPHIN) 1 g in dextrose 5 % 50 mL IVPB (1 g Intravenous New Bag/Given 05/19/16 0009)     Initial Impression / Assessment and Plan / ED Course  I have reviewed the triage vital signs and the nursing notes.  Pertinent labs & imaging results that were available during my care of the patient were reviewed by me and considered in my medical decision making (see chart for details).  Clinical Course    20 year old pregnant female presents to the emergency department today for symptoms consistent with a urinary tract infection. Urinalysis today suggestive of UTI. Patient with mild tachycardia which has improved with IV fluids. Patient also given an initial dose of Rocephin. She is afebrile and normotensive. No back pain, nausea, vomiting to raise concern for pyelonephritis.  Patient also concerned about bleeding in the setting of pregnancy. She has no blood  noted on GU exam today. Ultrasound imaging was obtained which shows findings consistent with placenta previa. Fetus is approximately [redacted]w[redacted]d heart rate 150bpm.   Given that pregnancy is of a nonviable gestation, I do not believe emergent consultation to OB/GYN is indicated. It is also likely for placenta previa to resolve spontaneously later in pregnancy. Patient does have follow-up with her OB/GYN later today. I have discussed these findings with the patient and recommended that she discuss them with her obstetrician. She verbalizes understanding of results and instructions for pelvic rest. Patient discharged in satisfactory condition with no unaddressed concerns.   Final Clinical Impressions(s) / ED Diagnoses   Final diagnoses:  UTI in pregnancy, second trimester  Placenta previa antepartum in second trimester    Vitals:   05/18/16 2100 05/18/16 2345 05/19/16 0000 05/19/16 0015  BP: 118/78 133/77 124/74 122/64  Pulse: 83 109 97 89  Resp: 18 (!) 30 20 20   Temp: 98.7 F (37.1 C)     TempSrc:      SpO2: 100% 100% 100% 100%  Weight:      Height:        New Prescriptions New Prescriptions   CEPHALEXIN (KEFLEX) 500 MG CAPSULE    Take 1 capsule (500 mg total) by mouth 2 (two) times daily.     KAntonietta Breach PA-C 05/19/16 0[place] MD 05/19/16 [phone]

## 2016-05-18 NOTE — ED Triage Notes (Signed)
Pt had a uti and states it went away and came back. Pt also states shes spotting. Pt is 14-[redacted] weeks pregnant, pt has not had ultrasound to determine age. Pt has not had antibiotics for UTI

## 2016-05-19 ENCOUNTER — Ambulatory Visit (HOSPITAL_COMMUNITY)
Admission: RE | Admit: 2016-05-19 | Discharge: 2016-05-19 | Disposition: A | Discharge status: Home / Self Care | Payer: BC Managed Care – PPO | Source: Ambulatory Visit | Attending: Medical | Admitting: Medical

## 2016-05-19 ENCOUNTER — Encounter (HOSPITAL_COMMUNITY): Payer: Self-pay

## 2016-05-19 ENCOUNTER — Emergency Department (HOSPITAL_COMMUNITY): Payer: BC Managed Care – PPO

## 2016-05-19 ENCOUNTER — Other Ambulatory Visit: Payer: Self-pay | Admitting: Medical

## 2016-05-19 DIAGNOSIS — Z3491 Encounter for supervision of normal pregnancy, unspecified, first trimester: Secondary | ICD-10-CM

## 2016-05-19 DIAGNOSIS — Z3A14 14 weeks gestation of pregnancy: Secondary | ICD-10-CM

## 2016-05-19 DIAGNOSIS — Z36 Encounter for antenatal screening of mother: Secondary | ICD-10-CM

## 2016-05-19 DIAGNOSIS — Z3682 Encounter for antenatal screening for nuchal translucency: Secondary | ICD-10-CM

## 2016-05-19 LAB — GC/CHLAMYDIA PROBE AMP (~~LOC~~) NOT AT ARMC
CHLAMYDIA, DNA PROBE: NEGATIVE
Neisseria Gonorrhea: NEGATIVE

## 2016-05-19 LAB — WET PREP, GENITAL
Sperm: NONE SEEN
Trich, Wet Prep: NONE SEEN
Yeast Wet Prep HPF POC: NONE SEEN

## 2016-05-19 LAB — ABO/RH: ABO/RH(D): O POS

## 2016-05-19 MED ORDER — CEPHALEXIN 500 MG PO CAPS: 500.0000 mg | ORAL_CAPSULE | Freq: Two times a day (BID) | ORAL | 0 refills | Status: DC

## 2016-05-19 NOTE — Discharge Instructions (Signed)
Your ultrasound today showed that you have findings consistent with placenta previa. This can be a normal finding in early pregnancy and will often resolve when your pregnancy progresses. You have been placed on pelvic rest for this reason. Avoid strenuous activity and heavy lifting. Do not engage in sexual intercourse. Discussed these findings further with your OB/GYN in your appointment today. Take Keflex as prescribed for urinary tract infection. Follow-up with [facility] for any new or concerning symptoms.

## 2016-05-19 NOTE — ED Notes (Signed)
Patient transported to Ultrasound 

## 2016-05-23 LAB — URINE CULTURE: Culture: >=100000 — AB

## 2016-05-28 ENCOUNTER — Other Ambulatory Visit (HOSPITAL_COMMUNITY): Payer: Self-pay

## 2016-06-11 ENCOUNTER — Ambulatory Visit (INDEPENDENT_AMBULATORY_CARE_PROVIDER_SITE_OTHER): Payer: BC Managed Care – PPO | Admitting: Obstetrics & Gynecology

## 2016-06-11 VITALS — BP 125/73 | HR 103 | Wt 224.0 lb

## 2016-06-11 DIAGNOSIS — Z23 Encounter for immunization: Secondary | ICD-10-CM

## 2016-06-11 DIAGNOSIS — O26892 Other specified pregnancy related conditions, second trimester: Secondary | ICD-10-CM

## 2016-06-11 DIAGNOSIS — Z3492 Encounter for supervision of normal pregnancy, unspecified, second trimester: Secondary | ICD-10-CM

## 2016-06-11 DIAGNOSIS — N898 Other specified noninflammatory disorders of vagina: Secondary | ICD-10-CM

## 2016-06-11 DIAGNOSIS — Z1389 Encounter for screening for other disorder: Secondary | ICD-10-CM

## 2016-06-11 LAB — POCT URINALYSIS DIP (DEVICE)
Bilirubin Urine: NEGATIVE
Glucose, UA: NEGATIVE mg/dL
KETONES UR: NEGATIVE mg/dL
Nitrite: NEGATIVE
PH: 5.5 (ref 5.0–8.0)
PROTEIN: NEGATIVE mg/dL
UROBILINOGEN UA: 0.2 mg/dL (ref 0.0–1.0)

## 2016-06-11 NOTE — Progress Notes (Signed)
   PRENATAL VISIT NOTE  Subjective:  Deanna [name] is a 20 y.o. G2P0010 at 87w0dbeing seen today for ongoing prenatal care.  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  Patient reports vaginal irritation for one week   . Vag. Bleeding: None.  Movement: Present. Denies leaking of fluid.   The following portions of the patient's history were reviewed and updated as appropriate: allergies, current medications, past family history, past medical history, past social history, past surgical history and problem list. Problem list updated.  Objective:   Vitals:   06/11/16 1251  BP: 125/73  Pulse: (!) 103  Weight: 224 lb (101.6 kg)    Fetal Status: Fetal Heart Rate (bpm): 148   Movement: Present     General:  Alert, oriented and cooperative. Patient is in no acute distress.  Skin: Skin is warm and dry. No rash noted.   Cardiovascular: Normal heart rate noted  Respiratory: Normal respiratory effort, no problems with respiration noted  Abdomen: Soft, gravid, appropriate for gestational age. Pain/Pressure: Absent     Pelvic:  Cervical exam deferred   White discharge seen, wet prep sample obtained.  Extremities: Normal range of motion.  Edema: None  Mental Status: Normal mood and affect. Normal behavior. Normal judgment and thought content.   Urinalysis: Urine Protein: Negative Urine Glucose: Negative  Assessment and Plan:  Pregnancy: G2P0010 at 161w0d1. Vaginal discharge during pregnancy in second trimester - Wet prep, genital, will follow up results and manage accordingly.  2. Needs flu shot - Flu Vaccine QUAD 36+ mos IM (Fluarix, Quad PF)  3. Encounter for routine screening for malformation using ultrasonics - [place]FM OB COMP + 14 WK; Future  4. Supervision of normal pregnancy, second  trimester Normal first screen. - Alpha fetoprotein, maternal  No other complaints or concerns.  Routine obstetric precautions reviewed.  Please refer to After Visit Summary for other counseling recommendations.  Return in about 4 weeks (around 07/09/2016) for OB Visit.  UgOsborne OmanMD

## 2016-06-11 NOTE — Progress Notes (Signed)
Thinks she has a yeast infection, recent abx use.  Pt reports her breast on the left is leaking fluid.

## 2016-06-12 LAB — ALPHA FETOPROTEIN, MATERNAL
AFP: 78.2 ng/mL
Curr Gest Age: 17 weeks
MOM FOR AFP: 2.36
OPEN SPINA BIFIDA: NEGATIVE

## 2016-06-12 LAB — WET PREP, GENITAL: Trich, Wet Prep: NONE SEEN

## 2016-06-12 MED ORDER — METRONIDAZOLE 500 MG PO TABS: 500.0000 mg | ORAL_TABLET | Freq: Two times a day (BID) | ORAL | 2 refills | Status: DC

## 2016-06-12 MED ORDER — TERCONAZOLE 0.4 % VA CREA: 1.0000 | TOPICAL_CREAM | Freq: Every day | VAGINAL | 2 refills | Status: DC

## 2016-06-12 NOTE — Addendum Note (Signed)
Addended by: Verita Schneiders A on: 06/12/2016 10:00 AM   Modules accepted: Orders

## 2016-06-26 ENCOUNTER — Ambulatory Visit (HOSPITAL_COMMUNITY)
Admission: RE | Admit: 2016-06-26 | Discharge: 2016-06-26 | Disposition: A | Discharge status: Home / Self Care | Payer: BC Managed Care – PPO | Source: Ambulatory Visit | Attending: Obstetrics & Gynecology | Admitting: Obstetrics & Gynecology

## 2016-06-26 DIAGNOSIS — Z3A19 19 weeks gestation of pregnancy: Secondary | ICD-10-CM | POA: Diagnosis not present

## 2016-06-26 DIAGNOSIS — Z1389 Encounter for screening for other disorder: Secondary | ICD-10-CM

## 2016-06-26 DIAGNOSIS — Z363 Encounter for antenatal screening for malformations: Secondary | ICD-10-CM | POA: Diagnosis present

## 2016-07-09 ENCOUNTER — Ambulatory Visit (INDEPENDENT_AMBULATORY_CARE_PROVIDER_SITE_OTHER): Payer: BC Managed Care – PPO | Admitting: Family Medicine

## 2016-07-09 VITALS — BP 129/81 | HR 97 | Wt 231.0 lb

## 2016-07-09 DIAGNOSIS — Z3402 Encounter for supervision of normal first pregnancy, second trimester: Secondary | ICD-10-CM

## 2016-07-09 DIAGNOSIS — M0800 Unspecified juvenile rheumatoid arthritis of unspecified site: Secondary | ICD-10-CM

## 2016-07-09 DIAGNOSIS — K529 Noninfective gastroenteritis and colitis, unspecified: Secondary | ICD-10-CM

## 2016-07-09 NOTE — Patient Instructions (Signed)
Breastfeeding Deciding to breastfeed is one of the best choices you can make for you and your baby. A change in hormones during pregnancy causes your breast tissue to grow and increases the number and size of your milk ducts. These hormones also allow proteins, sugars, and fats from your blood supply to make breast milk in your milk-producing glands. Hormones prevent breast milk from being released before your baby is born as well as prompt milk flow after birth. Once breastfeeding has begun, thoughts of your baby, as well as his or her sucking or crying, can stimulate the release of milk from your milk-producing glands.  BENEFITS OF BREASTFEEDING For Your Baby  Your first milk (colostrum) helps your baby's digestive system function better.  There are antibodies in your milk that help your baby fight off infections.  Your baby has a lower incidence of asthma, allergies, and sudden infant death syndrome.  The nutrients in breast milk are better for your baby than infant formulas and are designed uniquely for your baby's needs.  Breast milk improves your baby's brain development.  Your baby is less likely to develop other conditions, such as childhood obesity, asthma, or type 2 diabetes mellitus. For You  Breastfeeding helps to create a very special bond between you and your baby.  Breastfeeding is convenient. Breast milk is always available at the correct temperature and costs nothing.  Breastfeeding helps to burn calories and helps you lose the weight gained during pregnancy.  Breastfeeding makes your uterus contract to its prepregnancy size faster and slows bleeding (lochia) after you give birth.   Breastfeeding helps to lower your risk of developing type 2 diabetes mellitus, osteoporosis, and breast or ovarian cancer later in life. SIGNS THAT YOUR BABY IS HUNGRY Early Signs of Hunger  Increased alertness or activity.  Stretching.  Movement of the head from side to  side.  Movement of the head and opening of the mouth when the corner of the mouth or cheek is stroked (rooting).  Increased sucking sounds, smacking lips, cooing, sighing, or squeaking.  Hand-to-mouth movements.  Increased sucking of fingers or hands. Late Signs of Hunger  Fussing.  Intermittent crying. Extreme Signs of Hunger Signs of extreme hunger will require calming and consoling before your baby will be able to breastfeed successfully. Do not wait for the following signs of extreme hunger to occur before you initiate breastfeeding:  Restlessness.  A loud, strong cry.  Screaming. BREASTFEEDING BASICS Breastfeeding Initiation  Find a comfortable place to sit or lie down, with your neck and back well supported.  Place a pillow or rolled up blanket under your baby to bring him or her to the level of your breast (if you are seated). Nursing pillows are specially designed to help support your arms and your baby while you breastfeed.  Make sure that your baby's abdomen is facing your abdomen.  Gently massage your breast. With your fingertips, massage from your chest wall toward your nipple in a circular motion. This encourages milk flow. You may need to continue this action during the feeding if your milk flows slowly.  Support your breast with 4 fingers underneath and your thumb above your nipple. Make sure your fingers are well away from your nipple and your baby's mouth.  Stroke your baby's lips gently with your finger or nipple.  When your baby's mouth is open wide enough, quickly bring your baby to your breast, placing your entire nipple and as much of the colored area around your nipple (  areola) as possible into your baby's mouth.  More areola should be visible above your baby's upper lip than below the lower lip.  Your baby's tongue should be between his or her lower gum and your breast.  Ensure that your baby's mouth is correctly positioned around your nipple  (latched). Your baby's lips should create a seal on your breast and be turned out (everted).  It is common for your baby to suck about 2-3 minutes in order to start the flow of breast milk. Latching Teaching your baby how to latch on to your breast properly is very important. An improper latch can cause nipple pain and decreased milk supply for you and poor weight gain in your baby. Also, if your baby is not latched onto your nipple properly, he or she may swallow some air during feeding. This can make your baby fussy. Burping your baby when you switch breasts during the feeding can help to get rid of the air. However, teaching your baby to latch on properly is still the best way to prevent fussiness from swallowing air while breastfeeding. Signs that your baby has successfully latched on to your nipple:  Silent tugging or silent sucking, without causing you pain.  Swallowing heard between every 3-4 sucks.  Muscle movement above and in front of his or her ears while sucking. Signs that your baby has not successfully latched on to nipple:  Sucking sounds or smacking sounds from your baby while breastfeeding.  Nipple pain. If you think your baby has not latched on correctly, slip your finger into the corner of your baby's mouth to break the suction and place it between your baby's gums. Attempt breastfeeding initiation again. Signs of Successful Breastfeeding Signs from your baby:  A gradual decrease in the number of sucks or complete cessation of sucking.  Falling asleep.  Relaxation of his or her body.  Retention of a small amount of milk in his or her mouth.  Letting go of your breast by himself or herself. Signs from you:  Breasts that have increased in firmness, weight, and size 1-3 hours after feeding.  Breasts that are softer immediately after breastfeeding.  Increased milk volume, as well as a change in milk consistency and color by the fifth day of breastfeeding.  Nipples  that are not sore, cracked, or bleeding. Signs That Your [employer] is Getting Enough Milk  Wetting at least 3 diapers in a 24-hour period. The urine should be clear and pale yellow by age 13 days.  At least 3 stools in a 24-hour period by age 13 days. The stool should be soft and yellow.  At least 3 stools in a 24-hour period by age 56 days. The stool should be seedy and yellow.  No loss of weight greater than 10% of birth weight during the first 74 days of age.  Average weight gain of 4-7 ounces (113-198 g) per week after age 2 days.  Consistent daily weight gain by age 132 days, without weight loss after the age of 2 weeks. After a feeding, your baby may spit up a small amount. This is common. BREASTFEEDING FREQUENCY AND DURATION Frequent feeding will help you make more milk and can prevent sore nipples and breast engorgement. Breastfeed when you feel the need to reduce the fullness of your breasts or when your baby shows signs of hunger. This is called "breastfeeding on demand." Avoid introducing a pacifier to your baby while you are working to establish breastfeeding (the first 4-6 weeks  after your baby is born). After this time you may choose to use a pacifier. Research has shown that pacifier use during the first year of a baby's life decreases the risk of sudden infant death syndrome (SIDS). Allow your baby to feed on each breast as long as he or she wants. Breastfeed until your baby is finished feeding. When your baby unlatches or falls asleep while feeding from the first breast, offer the second breast. Because newborns are often sleepy in the first few weeks of life, you may need to awaken your baby to get him or her to feed. Breastfeeding times will vary from baby to baby. However, the following rules can serve as a guide to help you ensure that your baby is properly fed:  Newborns (babies 55 weeks of age or younger) may breastfeed every 1-3 hours.  Newborns should not go longer than 3 hours  during the day or 5 hours during the night without breastfeeding.  You should breastfeed your baby a minimum of 8 times in a 24-hour period until you begin to introduce solid foods to your baby at around 43 months of age. BREAST MILK PUMPING Pumping and storing breast milk allows you to ensure that your baby is exclusively fed your breast milk, even at times when you are unable to breastfeed. This is especially important if you are going back to work while you are still breastfeeding or when you are not able to be present during feedings. Your lactation consultant can give you guidelines on how long it is safe to store breast milk. A breast pump is a machine that allows you to pump milk from your breast into a sterile bottle. The pumped breast milk can then be stored in a refrigerator or freezer. Some breast pumps are operated by hand, while others use electricity. Ask your lactation consultant which type will work best for you. Breast pumps can be purchased, but some hospitals and breastfeeding support groups lease breast pumps on a monthly basis. A lactation consultant can teach you how to hand express breast milk, if you prefer not to use a pump. CARING FOR YOUR BREASTS WHILE YOU BREASTFEED Nipples can become dry, cracked, and sore while breastfeeding. The following recommendations can help keep your breasts moisturized and healthy:  Avoid using soap on your nipples.  Wear a supportive bra. Although not required, special nursing bras and tank tops are designed to allow access to your breasts for breastfeeding without taking off your entire bra or top. Avoid wearing underwire-style bras or extremely tight bras.  Air dry your nipples for 3-64mnutes after each feeding.  Use only cotton bra pads to absorb leaked breast milk. Leaking of breast milk between feedings is normal.  Use lanolin on your nipples after breastfeeding. Lanolin helps to maintain your skin's normal moisture barrier. If you use  pure lanolin, you do not need to wash it off before feeding your baby again. Pure lanolin is not toxic to your baby. You may also hand express a few drops of breast milk and gently massage that milk into your nipples and allow the milk to air dry. In the first few weeks after giving birth, some women experience extremely full breasts (engorgement). Engorgement can make your breasts feel heavy, warm, and tender to the touch. Engorgement peaks within 3-5 days after you give birth. The following recommendations can help ease engorgement:  Completely empty your breasts while breastfeeding or pumping. You may want to start by applying warm, moist heat (in  the shower or with warm water-soaked hand towels) just before feeding or pumping. This increases circulation and helps the milk flow. If your baby does not completely empty your breasts while breastfeeding, pump any extra milk after he or she is finished.  Wear a snug bra (nursing or regular) or tank top for 1-2 days to signal your body to slightly decrease milk production.  Apply ice packs to your breasts, unless this is too uncomfortable for you.  Make sure that your baby is latched on and positioned properly while breastfeeding. If engorgement persists after 48 hours of following these recommendations, contact your health care provider or a Science writer. OVERALL HEALTH CARE RECOMMENDATIONS WHILE BREASTFEEDING  Eat healthy foods. Alternate between meals and snacks, eating 3 of each per day. Because what you eat affects your breast milk, some of the foods may make your baby more irritable than usual. Avoid eating these foods if you are sure that they are negatively affecting your baby.  Drink milk, fruit juice, and water to satisfy your thirst (about 10 glasses a day).  Rest often, relax, and continue to take your prenatal vitamins to prevent fatigue, stress, and anemia.  Continue breast self-awareness checks.  Avoid chewing and smoking  tobacco. Chemicals from cigarettes that pass into breast milk and exposure to secondhand smoke may harm your baby.  Avoid alcohol and drug use, including marijuana. Some medicines that may be harmful to your baby can pass through breast milk. It is important to ask your health care provider before taking any medicine, including all over-the-counter and prescription medicine as well as vitamin and herbal supplements. It is possible to become pregnant while breastfeeding. If birth control is desired, ask your health care provider about options that will be safe for your baby. SEEK MEDICAL CARE IF:  You feel like you want to stop breastfeeding or have become frustrated with breastfeeding.  You have painful breasts or nipples.  Your nipples are cracked or bleeding.  Your breasts are red, tender, or warm.  You have a swollen area on either breast.  You have a fever or chills.  You have nausea or vomiting.  You have drainage other than breast milk from your nipples.  Your breasts do not become full before feedings by the fifth day after you give birth.  You feel sad and depressed.  Your baby is too sleepy to eat well.  Your baby is having trouble sleeping.   Your baby is wetting less than 3 diapers in a 24-hour period.  Your baby has less than 3 stools in a 24-hour period.  Your baby's skin or the white part of his or her eyes becomes yellow.   Your baby is not gaining weight by 25 days of age. SEEK IMMEDIATE MEDICAL CARE IF:  Your baby is overly tired (lethargic) and does not want to wake up and feed.  Your baby develops an unexplained fever.   This information is not intended to replace advice given to you by your health care provider. Make sure you discuss any questions you have with your health care provider.   Document Released: 09/07/2005 Document Revised: 05/29/2015 Document Reviewed: 03/01/2013 Elsevier Interactive Patient Education 2016 [place] of Pregnancy The second trimester is from week 13 through week 28, months 4 through 6. The second trimester is often a time when you feel your best. Your body has also adjusted to being pregnant, and you begin to feel better physically. Usually, morning sickness has lessened  or quit completely, you may have more energy, and you may have an increase in appetite. The second trimester is also a time when the fetus is growing rapidly. At the end of the sixth month, the fetus is about 9 inches long and weighs about 1 pounds. You will likely begin to feel the baby move (quickening) between 18 and 20 weeks of the pregnancy. BODY CHANGES Your body goes through many changes during pregnancy. The changes vary from woman to woman.   Your weight will continue to increase. You will notice your lower abdomen bulging out.  You may begin to get stretch marks on your hips, abdomen, and breasts.  You may develop headaches that can be relieved by medicines approved by your health care provider.  You may urinate more often because the fetus is pressing on your bladder.  You may develop or continue to have heartburn as a result of your pregnancy.  You may develop constipation because certain hormones are causing the muscles that push waste through your intestines to slow down.  You may develop hemorrhoids or swollen, bulging veins (varicose veins).  You may have back pain because of the weight gain and pregnancy hormones relaxing your joints between the bones in your pelvis and as a result of a shift in weight and the muscles that support your balance.  Your breasts will continue to grow and be tender.  Your gums may bleed and may be sensitive to brushing and flossing.  Dark spots or blotches (chloasma, mask of pregnancy) may develop on your face. This will likely fade after the baby is born.  A dark line from your belly button to the pubic area (linea nigra) may appear. This will likely fade after  the baby is born.  You may have changes in your hair. These can include thickening of your hair, rapid growth, and changes in texture. Some women also have hair loss during or after pregnancy, or hair that feels dry or thin. Your hair will most likely return to normal after your baby is born. WHAT TO EXPECT AT YOUR PRENATAL VISITS During a routine prenatal visit:  You will be weighed to make sure you and the fetus are growing normally.  Your blood pressure will be taken.  Your abdomen will be measured to track your baby's growth.  The fetal heartbeat will be listened to.  Any test results from the previous visit will be discussed. Your health care provider may ask you:  How you are feeling.  If you are feeling the baby move.  If you have had any abnormal symptoms, such as leaking fluid, bleeding, severe headaches, or abdominal cramping.  If you are using any tobacco products, including cigarettes, chewing tobacco, and electronic cigarettes.  If you have any questions. Other tests that may be performed during your second trimester include:  Blood tests that check for:  Low iron levels (anemia).  Gestational diabetes (between 24 and 28 weeks).  Rh antibodies.  Urine tests to check for infections, diabetes, or protein in the urine.  An ultrasound to confirm the proper growth and development of the baby.  An amniocentesis to check for possible genetic problems.  Fetal screens for spina bifida and Down syndrome.  HIV (human immunodeficiency virus) testing. Routine prenatal testing includes screening for HIV, unless you choose not to have this test. HOME CARE INSTRUCTIONS   Avoid all smoking, herbs, alcohol, and unprescribed drugs. These chemicals affect the formation and growth of the baby.  Do not  use any tobacco products, including cigarettes, chewing tobacco, and electronic cigarettes. If you need help quitting, ask your health care provider. You may receive counseling  support and other resources to help you quit.  Follow your health care provider's instructions regarding medicine use. There are medicines that are either safe or unsafe to take during pregnancy.  Exercise only as directed by your health care provider. Experiencing uterine cramps is a good sign to stop exercising.  Continue to eat regular, healthy meals.  Wear a good support bra for breast tenderness.  Do not use hot tubs, steam rooms, or saunas.  Wear your seat belt at all times when driving.  Avoid raw meat, uncooked cheese, cat litter boxes, and soil used by cats. These carry germs that can cause birth defects in the baby.  Take your prenatal vitamins.  Take 1500-2000 mg of calcium daily starting at the 20th week of pregnancy until you deliver your baby.  Try taking a stool softener (if your health care provider approves) if you develop constipation. Eat more high-fiber foods, such as fresh vegetables or fruit and whole grains. Drink plenty of fluids to keep your urine clear or pale yellow.  Take warm sitz baths to soothe any pain or discomfort caused by hemorrhoids. Use hemorrhoid cream if your health care provider approves.  If you develop varicose veins, wear support hose. Elevate your feet for 15 minutes, 3-4 times a day. Limit salt in your diet.  Avoid heavy lifting, wear low heel shoes, and practice good posture.  Rest with your legs elevated if you have leg cramps or low back pain.  Visit your dentist if you have not gone yet during your pregnancy. Use a soft toothbrush to brush your teeth and be gentle when you floss.  A sexual relationship may be continued unless your health care provider directs you otherwise.  Continue to go to all your prenatal visits as directed by your health care provider. SEEK MEDICAL CARE IF:   You have dizziness.  You have mild pelvic cramps, pelvic pressure, or nagging pain in the abdominal area.  You have persistent nausea, vomiting, or  diarrhea.  You have a bad smelling vaginal discharge.  You have pain with urination. SEEK IMMEDIATE MEDICAL CARE IF:   You have a fever.  You are leaking fluid from your vagina.  You have spotting or bleeding from your vagina.  You have severe abdominal cramping or pain.  You have rapid weight gain or loss.  You have shortness of breath with chest pain.  You notice sudden or extreme swelling of your face, hands, ankles, feet, or legs.  You have not felt your baby move in over an hour.  You have severe headaches that do not go away with medicine.  You have vision changes.   This information is not intended to replace advice given to you by your health care provider. Make sure you discuss any questions you have with your health care provider.   Document Released: 09/01/2001 Document Revised: 09/28/2014 Document Reviewed: 11/08/2012 Elsevier Interactive Patient Education [employer].

## 2016-07-09 NOTE — Assessment & Plan Note (Signed)
Has Crohn's--no meds and no issues

## 2016-07-09 NOTE — Progress Notes (Signed)
   PRENATAL VISIT NOTE  Subjective:  Deanna [name] is a 20 y.o. G2P0010 at 48w0dbeing seen today for ongoing prenatal care.  She is currently monitored for the following issues for this low-risk pregnancy and has Arthritis, 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; Inflammatory bowel disease; Keloid; Acne vulgaris; and Supervision of normal pregnancy on her problem list.  Patient reports no complaints.   .  .  Movement: Present. Denies leaking of fluid.   The following portions of the patient's history were reviewed and updated as appropriate: allergies, current medications, past family history, past medical history, past social history, past surgical history and problem list. Problem list updated.  Objective:   Vitals:   07/09/16 1256  BP: 129/81  Pulse: 97  Weight: 231 lb (104.8 kg)    Fetal Status: Fetal Heart Rate (bpm): 137 Fundal Height: 22 cm Movement: Present     General:  Alert, oriented and cooperative. Patient is in no acute distress.  Skin: Skin is warm and dry. No rash noted.   Cardiovascular: Normal heart rate noted  Respiratory: Normal respiratory effort, no problems with respiration noted  Abdomen: Soft, gravid, appropriate for gestational age. Pain/Pressure: Absent     Pelvic:  Cervical exam deferred        Extremities: Normal range of motion.  Edema: None  Mental Status: Normal mood and affect. Normal behavior. Normal judgment and thought content.   Assessment and Plan:  Pregnancy: G2P0010 at 215w0d1. Encounter for supervision of normal first pregnancy in second trimester Continue routine prenatal care. F/u u/s to complete anatomy   - [place] FOLLOW [place]; Future  General obstetric precautions including but not limited to vaginal bleeding, contractions, leaking of fluid and fetal movement were reviewed in detail with the patient. Please refer to After Visit Summary for other counseling recommendations.  [facility] 4 [facility] (on 08/06/2016[facility]

## 2016-07-13 ENCOUNTER — Inpatient Hospital Stay (HOSPITAL_COMMUNITY)
Admission: AD | Admit: 2016-07-13 | Discharge: 2016-07-13 | Disposition: A | Discharge status: Home / Self Care | Payer: BC Managed Care – PPO | Source: Ambulatory Visit | Attending: Family Medicine | Admitting: Family Medicine

## 2016-07-13 DIAGNOSIS — R109 Unspecified abdominal pain: Secondary | ICD-10-CM | POA: Diagnosis not present

## 2016-07-13 DIAGNOSIS — O26892 Other specified pregnancy related conditions, second trimester: Secondary | ICD-10-CM | POA: Diagnosis not present

## 2016-07-13 DIAGNOSIS — Z3A21 21 weeks gestation of pregnancy: Secondary | ICD-10-CM | POA: Insufficient documentation

## 2016-07-13 DIAGNOSIS — M545 Low back pain: Secondary | ICD-10-CM | POA: Diagnosis present

## 2016-07-13 DIAGNOSIS — O9989 Other specified diseases and conditions complicating pregnancy, childbirth and the puerperium: Secondary | ICD-10-CM

## 2016-07-13 DIAGNOSIS — M549 Dorsalgia, unspecified: Secondary | ICD-10-CM

## 2016-07-13 LAB — BASIC METABOLIC PANEL
Anion gap: 6 (ref 5–15)
BUN: 7 mg/dL (ref 6–20)
CO2: 24 mmol/L (ref 22–32)
CREATININE: 0.59 mg/dL (ref 0.44–1.00)
Calcium: 8.9 mg/dL (ref 8.9–10.3)
Chloride: 105 mmol/L (ref 101–111)
GFR calc Af Amer: >60 mL/min (ref >60)
Glucose, Bld: 80 mg/dL (ref 65–99)
Potassium: 4.4 mmol/L (ref 3.5–5.1)
SODIUM: 135 mmol/L (ref 135–145)

## 2016-07-13 LAB — URINALYSIS, ROUTINE W REFLEX MICROSCOPIC
BILIRUBIN URINE: NEGATIVE
Glucose, UA: NEGATIVE mg/dL
HGB URINE DIPSTICK: NEGATIVE
Ketones, ur: NEGATIVE mg/dL
Leukocytes, UA: NEGATIVE
Nitrite: NEGATIVE
PH: 7 (ref 5.0–8.0)
Protein, ur: NEGATIVE mg/dL
SPECIFIC GRAVITY, URINE: 1.02 (ref 1.005–1.030)

## 2016-07-13 LAB — CBC WITH DIFFERENTIAL/PLATELET
Basophils Absolute: 0 10*3/uL (ref 0.0–0.1)
Basophils Relative: 0 %
EOS ABS: 0.1 10*3/uL (ref 0.0–0.7)
EOS PCT: 1 %
HCT: 34 % — ABNORMAL LOW (ref 36.0–46.0)
Hemoglobin: 11.6 g/dL — ABNORMAL LOW (ref 12.0–15.0)
LYMPHS ABS: 2.1 10*3/uL (ref 0.7–4.0)
Lymphocytes Relative: 26 %
MCH: 29.2 pg (ref 26.0–34.0)
MCHC: 34.1 g/dL (ref 30.0–36.0)
MCV: 85.6 fL (ref 78.0–100.0)
MONOS PCT: 4 %
Monocytes Absolute: 0.3 10*3/uL (ref 0.1–1.0)
Neutro Abs: 5.4 10*3/uL (ref 1.7–7.7)
Neutrophils Relative %: 69 %
PLATELETS: 225 10*3/uL (ref 150–400)
RBC: 3.97 MIL/uL (ref 3.87–5.11)
RDW: 14.8 % (ref 11.5–15.5)
WBC: 7.9 10*3/uL (ref 4.0–10.5)

## 2016-07-13 MED ORDER — CYCLOBENZAPRINE HCL 10 MG PO TABS: 10.0000 mg | ORAL_TABLET | Freq: Three times a day (TID) | ORAL | 2 refills | Status: DC | PRN

## 2016-07-13 MED ORDER — DICYCLOMINE HCL 10 MG PO CAPS
10.0000 mg | ORAL_CAPSULE | Freq: Once | ORAL | Status: AC
  Administered 2016-07-13: 10 mg via ORAL
  Filled 2016-07-13: qty 1

## 2016-07-13 NOTE — Discharge Instructions (Signed)

## 2016-07-13 NOTE — MAU Note (Signed)
Sharp pains in low back, pain in butt, started Sat night.  At first thought it was from constipation.  Having cramping in lower abd down into legs, like a muscle spasm.  Thought she should get checked.

## 2016-07-13 NOTE — MAU Provider Note (Signed)
Chief Complaint:  Back Pain   First Provider Initiated Contact with Patient 07/13/16 1508     HPI: Deanna [name] is a 20 y.o. G2P0010 at 44w4dho presents to maternity admissions reporting Back pain since Saturday.  Starts in midback and goes down to buttocks with pelvic cramping and thighs aching.  Nothing makes it better or worse, except eating seems to make it hurt more..Bonne Doloresmiralax for possible constipation with no relief.  Tylenol did not help. She reports good fetal movement, denies LOF, vaginal bleeding, vaginal itching/burning, urinary symptoms, h/a, dizziness, n/v, diarrhea, constipation or fever/chills.   Back Pain  This is a new problem. The current episode started in the past 7 days. The problem occurs constantly. The problem has been waxing and waning since onset. The pain is present in the lumbar spine, sacro-iliac and gluteal (Starts mid-back, radiates to buttock and makes legs ache). The quality of the pain is described as aching. The pain is moderate. The pain is the same all the time. Stiffness is present all day. Associated symptoms include abdominal pain (pelvic cramping), leg pain and pelvic pain. Pertinent negatives include no dysuria, fever, numbness, paresis, paresthesias, tingling or weakness. Risk factors include pregnancy. She has tried analgesics for the symptoms. The treatment provided no relief.   RN Note: Sharp pains in low back, pain in butt, started Sat night.  At first thought it was from constipation.  Having cramping in lower abd down into legs, like a muscle spasm.  Thought she should get checked  Past Medical History: Past Medical History:  Diagnosis Date  . Arthritis, 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[place]   . Inflammatory bowel disease    chrohn's disease    Past obstetric history: OB History  Gravida Para Term Preterm AB Living  2 0 0 0 1 0  SAB TAB Ectopic Multiple Live Births  0 1 0 0 0    # Outcome Date GA Lbr Len/2nd Weight Sex Delivery Anes PTL Lv   2 Current           1 TAB 04/2015              Past Surgical History: No past surgical history on file.  Family History: Family History  Problem Relation Age of Onset  . Diabetes Maternal Grandmother     Social History: Social History  Substance Use Topics  . Smoking status: Never Smoker  . Smokeless tobacco: Never Used  . Alcohol use No    Allergies: No Known Allergies  Meds:  Prescriptions Prior to Admission  Medication Sig Dispense Refill Last Dose  . acetaminophen (TYLENOL) 500 MG tablet Take 1,000 mg by mouth every 6 (six) hours as needed for mild pain, moderate pain or headache.   07/13/2016 at 0800  . Prenat-FeFum-FePo-FA-Omega 3 (CONCEPT DHA) 53.5-38-1 MG CAPS Take 1 capsule by mouth daily.   07/13/2016 at Unknown time    I have reviewed patient's Past Medical Hx, Surgical Hx, Family Hx, Social Hx, medications and allergies.   ROS:  Review of Systems  Constitutional: Negative for fever.  Gastrointestinal: Positive for abdominal pain (pelvic cramping).  Genitourinary: Positive for pelvic pain. Negative for dysuria.  Musculoskeletal: Positive for back pain.  Neurological: Negative for tingling, weakness, numbness and paresthesias.   Other systems negative  Physical Exam  Patient Vitals for the past 24 hrs:  BP Temp Temp src Pulse Resp SpO2 Height Weight  07/13/16 1446 128/62 98 F (36.7 C) Oral 107 20 100 % 5' 7"  (1.702  m) 234 lb (106.1 kg)  07/13/16 1432 124/66 98.1 F (36.7 C) - 93 20 - - 231 lb 6.4 oz (105 kg)   Constitutional: Well-developed, well-nourished female in no acute distress.  Cardiovascular: normal rate and rhythm Respiratory: normal effort, clear to auscultation bilaterally GI: Abd soft, non-tender, gravid appropriate for gestational age.   No rebound or guarding.       Back nontender.  No muscle tenderness.  MS: Extremities nontender, no edema, normal ROM Neurologic: Alert and oriented x 4.  GU: Neg CVAT.  PELVIC EXAM: Cervix closed  and long   Fetal heart rate 148 No contractions per [employer]: Results for orders placed or performed during the hospital encounter of 07/13/16 (from the past 72 hour(s))  Urinalysis, Routine w reflex microscopic (not at [facility])     Status: Abnormal   Collection Time: 07/13/16  2:35 PM  Result Value Ref Range   Color, Urine YELLOW YELLOW   APPearance HAZY (A) CLEAR   Specific Gravity, Urine 1.020 1.005 - 1.030   pH 7.0 5.0 - 8.0   Glucose, UA NEGATIVE NEGATIVE mg/dL   Hgb urine dipstick NEGATIVE NEGATIVE   Bilirubin Urine NEGATIVE NEGATIVE   Ketones, ur NEGATIVE NEGATIVE mg/dL   Protein, ur NEGATIVE NEGATIVE mg/dL   Nitrite NEGATIVE NEGATIVE   Leukocytes, UA NEGATIVE NEGATIVE    Comment: MICROSCOPIC NOT DONE ON URINES WITH NEGATIVE PROTEIN, BLOOD, LEUKOCYTES, NITRITE, OR GLUCOSE <1000 mg/dL.  CBC with Differential/Platelet     Status: Abnormal   Collection Time: 07/13/16  3:50 PM  Result Value Ref Range   WBC 7.9 4.0 - 10.5 K/uL   RBC 3.97 3.87 - 5.11 MIL/uL   Hemoglobin 11.6 (L) 12.0 - 15.0 g/dL   HCT 34.0 (L) 36.0 - 46.0 %   MCV 85.6 78.0 - 100.0 fL   MCH 29.2 26.0 - 34.0 pg   MCHC 34.1 30.0 - 36.0 g/dL   RDW 14.8 11.5 - 15.5 %   Platelets 225 150 - 400 K/uL   Neutrophils Relative % 69 %   Neutro Abs 5.4 1.7 - 7.7 K/uL   Lymphocytes Relative 26 %   Lymphs Abs 2.1 0.7 - 4.0 K/uL   Monocytes Relative 4 %   Monocytes Absolute 0.3 0.1 - 1.0 K/uL   Eosinophils Relative 1 %   Eosinophils Absolute 0.1 0.0 - 0.7 K/uL   Basophils Relative 0 %   Basophils Absolute 0.0 0.0 - 0.1 K/uL  Basic metabolic panel     Status: None   Collection Time: 07/13/16  3:50 PM  Result Value Ref Range   Sodium 135 135 - 145 mmol/L   Potassium 4.4 3.5 - 5.1 mmol/L   Chloride 105 101 - 111 mmol/L   CO2 24 22 - 32 mmol/L   Glucose, Bld 80 65 - 99 mg/dL   BUN 7 6 - 20 mg/dL   Creatinine, Ser 0.59 0.44 - 1.00 mg/dL   Calcium 8.9 8.9 - 10.3 mg/dL   GFR calc non Af Amer >60 >60 mL/min   GFR calc  Af Amer >60 >60 mL/min    Comment: (NOTE) The eGFR has been calculated using the CKD EPI equation. This calculation has not been validated in all clinical situations. eGFR's persistently <60 mL/min signify possible Chronic Kidney Disease.    Anion gap 6 5 - 15    Imaging:  [place] Mfm Ob Comp + 14 Wk  Result Date: 06/26/2016 OBSTETRICAL ULTRASOUND: This exam was performed within a [facility] Ultrasound Department. The OB US report was generated in the AS system, and faxed to the ordering physician.  This report is available in the [employer]. See the AS Obstetric US report via the Image Link.   MAU Course/MDM: I have ordered labs and reviewed results. UA is negative. Will check CBC and BMET  Consult Dr Nehemiah Settle with presentation, exam findings and test results.  He thinks it may be a Crohns exacerbation. Treatments in MAU included Bentyl  >>.  No change in pain  Will send home with precautions to return if worse or new symptoms Will Rx Flexeril in case this is back spasm Advised needs followup with GI re: Crohns evaluation  Assessment: SIUP at 67w4dBack and abdominal pain No evidence of acute abdominal process/infection No evidence of back spasm, but will treat as such, given other etiologies ruled out Urine clear  Plan: Discharge home Rx Flexeril for back spasms, advised not to drive while taking Preterm Labor precautions and fetal kick counts Follow up in Office for prenatal visits and recheck of status  Encouraged to return here or to other Urgent Care/ED if she develops worsening of symptoms, increase in pain, fever, or other concerning symptoms.   Pt stable at time of discharge.  MHansel FeinsteinCNM, MSN Certified Nurse-Midwife 07/13/2016 3:16 PM

## 2016-07-29 ENCOUNTER — Ambulatory Visit (HOSPITAL_COMMUNITY)
Admission: RE | Admit: 2016-07-29 | Discharge: 2016-07-29 | Disposition: A | Discharge status: Home / Self Care | Payer: BC Managed Care – PPO | Source: Ambulatory Visit | Attending: Family Medicine | Admitting: Family Medicine

## 2016-07-29 DIAGNOSIS — O99212 Obesity complicating pregnancy, second trimester: Secondary | ICD-10-CM | POA: Insufficient documentation

## 2016-07-29 DIAGNOSIS — Z3402 Encounter for supervision of normal first pregnancy, second trimester: Secondary | ICD-10-CM

## 2016-07-29 DIAGNOSIS — Z362 Encounter for other antenatal screening follow-up: Secondary | ICD-10-CM | POA: Diagnosis present

## 2016-07-29 DIAGNOSIS — Z3A33 33 weeks gestation of pregnancy: Secondary | ICD-10-CM | POA: Insufficient documentation

## 2016-08-06 ENCOUNTER — Encounter: Payer: Self-pay | Admitting: Obstetrics and Gynecology

## 2016-08-06 ENCOUNTER — Encounter: Payer: BC Managed Care – PPO | Admitting: Family Medicine

## 2016-08-06 ENCOUNTER — Ambulatory Visit (INDEPENDENT_AMBULATORY_CARE_PROVIDER_SITE_OTHER): Payer: BC Managed Care – PPO | Admitting: Obstetrics and Gynecology

## 2016-08-06 VITALS — BP 93/74 | HR 89 | Wt 235.3 lb

## 2016-08-06 DIAGNOSIS — M0800 Unspecified juvenile rheumatoid arthritis of unspecified site: Secondary | ICD-10-CM

## 2016-08-06 DIAGNOSIS — Z3402 Encounter for supervision of normal first pregnancy, second trimester: Secondary | ICD-10-CM

## 2016-08-06 MED ORDER — ZOLPIDEM TARTRATE 10 MG PO TABS: 10.0000 mg | ORAL_TABLET | Freq: Every evening | ORAL | 3 refills | Status: DC | PRN

## 2016-08-06 NOTE — Progress Notes (Signed)
   PRENATAL VISIT NOTE  Subjective:  Deanna [name] is a 20 y.o. G2P0010 at 20w0dbeing seen today for ongoing prenatal care.  She is currently monitored for the following issues for this low-risk pregnancy and has Arthritis, 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[place]; Inflammatory bowel disease; Keloid; Acne vulgaris; and Supervision of normal pregnancy on her problem list.  Patient reports some insomnia and joint pain which is still manageable a this point.  Contractions: Not present. Vag. Bleeding: None.  Movement: Present. Denies leaking of fluid.   The following portions of the patient's history were reviewed and updated as appropriate: allergies, current medications, past family history, past medical history, past social history, past surgical history and problem list. Problem list updated.  Objective:   Vitals:   08/06/16 1254  BP: 93/74  Pulse: 89  Weight: 235 lb 4.8 oz (106.7 kg)    Fetal Status: Fetal Heart Rate (bpm): 131   Movement: Present     General:  Alert, oriented and cooperative. Patient is in no acute distress.  Skin: Skin is warm and dry. No rash noted.   Cardiovascular: Normal heart rate noted  Respiratory: Normal respiratory effort, no problems with respiration noted  Abdomen: Soft, gravid, appropriate for gestational age. Pain/Pressure: Present     Pelvic:  Cervical exam deferred        Extremities: Normal range of motion.  Edema: Trace  Mental Status: Normal mood and affect. Normal behavior. Normal judgment and thought content.   Assessment and Plan:  Pregnancy: G2P0010 at 206w0d1. Encounter for supervision of normal first pregnancy in second trimester Advised patient to follow up with rheumatologist. She does not recall which medication she was prescribed and was told to bring it at her next appointment Rx ambien provided - third trimester labs, tdap and 2 hr glucola at next visit. Informed patient to come in fasting  2. Arthritis, 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HCSlidellFollow  up with rheumatologist  Preterm labor symptoms and general obstetric precautions including but not limited to vaginal bleeding, contractions, leaking of fluid and fetal movement were reviewed in detail with the patient. Please refer to After Visit Summary for other counseling recommendations.  Return in about 3 weeks (around 08/27/2016) for ROB and 2 hr glucola.   PeMora BellmanMD

## 2016-08-27 ENCOUNTER — Encounter: Payer: BC Managed Care – PPO | Admitting: Obstetrics & Gynecology

## 2016-09-01 ENCOUNTER — Encounter: Payer: Self-pay | Admitting: Advanced Practice Midwife

## 2016-09-01 ENCOUNTER — Ambulatory Visit (INDEPENDENT_AMBULATORY_CARE_PROVIDER_SITE_OTHER): Payer: BC Managed Care – PPO | Admitting: Advanced Practice Midwife

## 2016-09-01 VITALS — BP 128/77 | HR 102 | Wt 244.5 lb

## 2016-09-01 DIAGNOSIS — M0880 Other juvenile arthritis, unspecified site: Secondary | ICD-10-CM

## 2016-09-01 DIAGNOSIS — Z23 Encounter for immunization: Secondary | ICD-10-CM

## 2016-09-01 DIAGNOSIS — Z3403 Encounter for supervision of normal first pregnancy, third trimester: Secondary | ICD-10-CM

## 2016-09-01 DIAGNOSIS — M0890 Juvenile arthritis, unspecified, unspecified site: Secondary | ICD-10-CM

## 2016-09-01 LAB — CBC
HEMATOCRIT: 36.5 % (ref 35.0–45.0)
Hemoglobin: 11.7 g/dL (ref 11.7–15.5)
MCH: 28.4 pg (ref 27.0–33.0)
MCHC: 32.1 g/dL (ref 32.0–36.0)
MCV: 88.6 fL (ref 80.0–100.0)
MPV: 10.4 fL (ref 7.5–12.5)
PLATELETS: 238 10*3/uL (ref 140–400)
RBC: 4.12 MIL/uL (ref 3.80–5.10)
RDW: 13.7 % (ref 11.0–15.0)
WBC: 8.5 10*3/uL (ref 3.8–10.8)

## 2016-09-01 MED ORDER — TETANUS-DIPHTH-ACELL PERTUSSIS 5-2.5-18.5 LF-MCG/0.5 IM SUSP
0.5000 mL | Freq: Once | INTRAMUSCULAR | Status: AC
  Administered 2016-09-01: 0.5 mL via INTRAMUSCULAR

## 2016-09-01 NOTE — Patient Instructions (Signed)

## 2016-09-01 NOTE — Progress Notes (Signed)
28 wk packet given  28 wk labs today Tdap vaccine given

## 2016-09-01 NOTE — Progress Notes (Signed)
   PRENATAL VISIT NOTE  Subjective:  Deanna [name] is a 20 y.o. G2P0010 at 13w5dbeing seen today for ongoing prenatal care.  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  Patient reports no complaints.  Contractions: Not present. Vag. Bleeding: None.  Movement: Present. Denies leaking of fluid.   The following portions of the patient's history were reviewed and updated as appropriate: allergies, current medications, past family history, past medical history, past social history, past surgical history and problem list. Problem list updated.  Objective:   Vitals:   09/01/16 0806  BP: 128/77  Pulse: (!) 102  Weight: 244 lb 8 oz (110.9 kg)    Fetal Status: Fetal Heart Rate (bpm): 137   Movement: Present     General:  Alert, oriented and cooperative. Patient is in no acute distress.  Skin: Skin is warm and dry. No rash noted.   Cardiovascular: Normal heart rate noted  Respiratory: Normal respiratory effort, no problems with respiration noted  Abdomen: Soft, gravid, appropriate for gestational age. Pain/Pressure: Present     Pelvic:  Cervical exam deferred        Extremities: Normal range of motion.  Edema: Trace  Mental Status: Normal mood and affect. Normal behavior. Normal judgment and thought content.   Assessment and Plan:  Pregnancy: G2P0010 at 257w5d1. Supervision of low-risk first pregnancy, third trimester       - RPR - HIV antibody (with reflex) - CBC - 2Hr GTT w/ 1 Hr Carpenter 75 g  Preterm labor symptoms and general obstetric precautions including but not limited to vaginal bleeding, contractions, leaking of fluid and fetal movement were reviewed in detail with the  patient. Please refer to After Visit Summary for other counseling recommendations.   RTC 2 weeks  MaSeabron SpatesCNM

## 2016-09-02 LAB — 2HR GTT W 1 HR, CARPENTER, 75 G
GLUCOSE, 1 HR, GEST: 82 mg/dL (ref <180)
Glucose, 2 Hr, Gest: 67 mg/dL (ref <153)
Glucose, Fasting, Gest: 71 mg/dL (ref 65–91)

## 2016-09-02 LAB — HIV ANTIBODY (ROUTINE TESTING W REFLEX): HIV 1&2 Ab, 4th Generation: NONREACTIVE

## 2016-09-02 LAB — RPR

## 2016-09-15 ENCOUNTER — Ambulatory Visit (INDEPENDENT_AMBULATORY_CARE_PROVIDER_SITE_OTHER): Payer: BC Managed Care – PPO | Admitting: Family Medicine

## 2016-09-15 ENCOUNTER — Ambulatory Visit (INDEPENDENT_AMBULATORY_CARE_PROVIDER_SITE_OTHER): Payer: Self-pay | Admitting: Clinical

## 2016-09-15 VITALS — BP 123/72 | HR 90 | Wt 246.0 lb

## 2016-09-15 DIAGNOSIS — F4323 Adjustment disorder with mixed anxiety and depressed mood: Secondary | ICD-10-CM

## 2016-09-15 DIAGNOSIS — M0890 Juvenile arthritis, unspecified, unspecified site: Secondary | ICD-10-CM

## 2016-09-15 DIAGNOSIS — M0880 Other juvenile arthritis, unspecified site: Secondary | ICD-10-CM

## 2016-09-15 DIAGNOSIS — O99343 Other mental disorders complicating pregnancy, third trimester: Secondary | ICD-10-CM

## 2016-09-15 DIAGNOSIS — Z3403 Encounter for supervision of normal first pregnancy, third trimester: Secondary | ICD-10-CM

## 2016-09-15 DIAGNOSIS — O99340 Other mental disorders complicating pregnancy, unspecified trimester: Secondary | ICD-10-CM

## 2016-09-15 DIAGNOSIS — F419 Anxiety disorder, unspecified: Secondary | ICD-10-CM

## 2016-09-15 DIAGNOSIS — K50813 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: Secondary | ICD-10-CM

## 2016-09-15 NOTE — Patient Instructions (Signed)

## 2016-09-15 NOTE — Progress Notes (Signed)
   Subjective:  Deanna [name] is a 20 y.o. G2P0010 at 68w5dbeing seen today for ongoing prenatal care.  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  Patient reports no complaints.  Contractions: Irritability. Vag. Bleeding: None.  Movement: Present. Denies leaking of fluid.   The following portions of the patient's history were reviewed and updated as appropriate: allergies, current medications, past family history, past medical history, past social history, past surgical history and problem list. Problem list updated.  Objective:   Vitals:   09/15/16 1644  BP: 123/72  Pulse: 90  Weight: 246 lb (111.6 kg)    Fetal Status: Fetal Heart Rate (bpm): 138   Movement: Present     General:  Alert, oriented and cooperative. Patient is in no acute distress.  Skin: Skin is warm and dry. No rash noted.   Cardiovascular: Normal heart rate noted  Respiratory: Normal respiratory effort, no problems with respiration noted  Abdomen: Soft, gravid, appropriate for gestational age. Pain/Pressure: Present     Pelvic:  Cervical exam deferred        Extremities: Normal range of motion.  Edema: Trace  Mental Status: Normal mood and affect. Normal behavior. Normal judgment and thought content.       Urinalysis    Component Value Date/Time   COLORURINE YELLOW 07/13/2016 1435   APPEARANCEUR HAZY (A) 07/13/2016 1435   LABSPEC 1.020 07/13/2016 1435   PHURINE 7.0 07/13/2016 1435   GLUCOSEU NEGATIVE 07/13/2016 1435   HGBUR NEGATIVE 07/13/2016 1435   BILIRUBINUR NEGATIVE 07/13/2016 1435   KETONESUR NEGATIVE 07/13/2016 1435   PROTEINUR NEGATIVE 07/13/2016 1435   UROBILINOGEN 0.2 06/11/2016 1336   NITRITE NEGATIVE 07/13/2016 1435   LEUKOCYTESUR NEGATIVE 07/13/2016 1435       Assessment and Plan:  Pregnancy: G2P0010 at 39w5d1. Encounter for supervision of normal first pregnancy in third trimester - 2 hr GTT NORMAL - Labs UTD  2. 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[name]) -stable  4. Anxiety disorder affecting pregnancy, antepartum - [facility].    Preterm labor symptoms and general obstetric precautions including but not limited to vaginal bleeding, contractions, leaking of fluid and fetal movement were reviewed in detail with the patient. Please refer to After Visit Summary for other counseling recommendations.  Return in about 2 weeks (around 09/29/2016) for Routine OB visit.   ElIsaias SakaiDO OB Fellow [facility]

## 2016-09-16 NOTE — BH Specialist Note (Signed)
Session Start time: 5:00   End Time: 5:25 Total Time:  25 minutes Type of Service: [place] Interpreter: No.   Interpreter Name & Language: n/a # [facility] Visits July 2017-June 2018: 1st  SUBJECTIVE: Deanna [name] is a 20 y.o. female  Pt. was referred by Dr Vanetta Shawl for:  anxiety and depression. Pt. reports the following symptoms/concerns: Pt states that she is experiencing an increase in feeling anxious and tired, and would like to cope without medication. Duration of problem:  Increase in over one month (anxiety for years) Severity: moderate Previous treatment: Medication in past  OBJECTIVE: Mood: Appropriate & Affect: Appropriate Risk of harm to self or others: No known risk of harm to self or others Assessments administered: PHQ9: 9/ GAD7: 12  LIFE CONTEXT:  Family & Social: Lives with husband  School/ Work: -- Self-Care: Some sleep difficulty Life changes: Current pregnancy What is important to pt/family (values): Healthy baby  GOALS ADDRESSED:  Reduce symptoms of anxiety and depression  INTERVENTIONS: Meditation: CALM relaxation breathing exercise   ASSESSMENT:  Pt currently experiencing Adjustment disorder with mixed anxious and depressed mood.  Pt may benefit from psychoeducation and brief therapeutic intervention regarding coping with anxious and depressed mood.   PLAN: 1. F/U with behavioral health clinician: One month  2. Behavioral Health meds: none 3. Behavioral recommendations:  -Practice CALM relaxation breathing exercise daily, with prenatal vitamin -Read educational material regarding coping with anxiety and depression -Use calming apps for additional self-care strategy 4. Referral: Brief Counseling/Psychotherapy and Psychoeducation 5. From scale of 1-10, how likely are you to follow plan: [place]:   Warm Hand Off Completed.        Depression screen [facility] 2/9  09/15/2016 08/06/2016 05/05/2016 07/17/2015  Decreased Interest 0 0 1 0  Down, Depressed, Hopeless 1 1 1  0  PHQ - 2 Score 1 1 2  0  Altered sleeping 2 3 2  0  Tired, decreased energy 3 2 2  0  Change in appetite 1 1 3  0  Feeling bad or failure about yourself  0 2 0 0  Trouble concentrating 2 0 0 0  Moving slowly or fidgety/restless 0 0 0 0  Suicidal thoughts 0 0 0 0  PHQ-9 Score 9 9 9  0   GAD 7 : Generalized Anxiety Score 09/15/2016 08/06/2016 05/05/2016  Nervous, Anxious, on Edge 2 2 2   Control/stop worrying 2 2 0  Worry too much - different things 2 2 1   Trouble relaxing 2 2 1   Restless 2 0 0  Easily annoyed or irritable 2 3 1   Afraid - awful might happen 0 0 2  Total GAD 7 Score 12 11 7

## 2016-09-19 ENCOUNTER — Inpatient Hospital Stay (HOSPITAL_COMMUNITY)
Admission: AD | Admit: 2016-09-19 | Discharge: 2016-09-19 | Disposition: A | Discharge status: Home / Self Care | Payer: BC Managed Care – PPO | Source: Ambulatory Visit | Attending: Obstetrics and Gynecology | Admitting: Obstetrics and Gynecology

## 2016-09-19 ENCOUNTER — Encounter (HOSPITAL_COMMUNITY): Payer: Self-pay | Admitting: *Deleted

## 2016-09-19 DIAGNOSIS — M7918 Myalgia, other site: Secondary | ICD-10-CM

## 2016-09-19 DIAGNOSIS — O26893 Other specified pregnancy related conditions, third trimester: Secondary | ICD-10-CM | POA: Diagnosis not present

## 2016-09-19 DIAGNOSIS — R1030 Lower abdominal pain, unspecified: Secondary | ICD-10-CM | POA: Diagnosis present

## 2016-09-19 DIAGNOSIS — Z3689 Encounter for other specified antenatal screening: Secondary | ICD-10-CM

## 2016-09-19 DIAGNOSIS — O9989 Other specified diseases and conditions complicating pregnancy, childbirth and the puerperium: Secondary | ICD-10-CM

## 2016-09-19 DIAGNOSIS — Z3A31 31 weeks gestation of pregnancy: Secondary | ICD-10-CM | POA: Diagnosis not present

## 2016-09-19 DIAGNOSIS — M791 Myalgia: Secondary | ICD-10-CM

## 2016-09-19 LAB — URINALYSIS, ROUTINE W REFLEX MICROSCOPIC
Bilirubin Urine: NEGATIVE
GLUCOSE, UA: NEGATIVE mg/dL
Hgb urine dipstick: NEGATIVE
KETONES UR: NEGATIVE mg/dL
Nitrite: NEGATIVE
PROTEIN: NEGATIVE mg/dL
Specific Gravity, Urine: 1.02 (ref 1.005–1.030)
pH: 6 (ref 5.0–8.0)

## 2016-09-19 NOTE — MAU Note (Addendum)
Pt vomited twice today at work and had some soreness throughout her abd. Then started having sharp pins in her LLQ and cramping. Not nauseated now,

## 2016-09-19 NOTE — MAU Provider Note (Signed)
History     CSN: [id number]  Arrival date and time: 09/19/16 1620   None     No chief complaint on file.  G2P0010 @31 .2 weeks here with lower abdominal pain. She reports having several episodes of emesis earlier this am then pain started after. She has had intermittent emesis throughout pregnancy. She denies sick contacts. No fevers. She describes the pain as bilateral cramping, intermittent and occurred while at work. She did not take anything for the pain but she did leave work, went home and lied down. Pain greatly improved after that but has not completely subsided. She reports good FM. No VB or LOF. She is unsure if having ctx.    OB History    Gravida Para Term Preterm AB Living   2 0 0 0 1 0   SAB TAB Ectopic Multiple Live Births   0 1 0 0 0      Past Medical History:  Diagnosis Date  . 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 Inflammatory bowel disease    chrohn's disease    Past Surgical History:  Procedure Laterality Date  . NO PAST SURGERIES      Family History  Problem Relation Age of Onset  . Diabetes Maternal Grandmother     Social History  Substance Use Topics  . Smoking status: Never Smoker  . Smokeless tobacco: Never Used  . Alcohol use No    Allergies: No Known Allergies  Prescriptions Prior to Admission  Medication Sig Dispense Refill Last Dose  . acetaminophen (TYLENOL) 500 MG tablet Take 1,000 mg by mouth every 6 (six) hours as needed for mild pain, moderate pain or headache.   Taking  . Prenat-FeFum-FePo-FA-Omega 3 (CONCEPT DHA) 53.5-38-1 MG CAPS Take 1 capsule by mouth daily.   Taking    Review of Systems  Constitutional: Negative.   Gastrointestinal: Positive for abdominal pain, nausea and vomiting. Negative for constipation and diarrhea.  Genitourinary: Negative.    Physical Exam   Blood pressure 123/70, pulse 116, temperature 98.2 F (36.8 C), resp. rate 18, last menstrual period 02/16/2016.  Physical Exam  Constitutional:  She is oriented to person, place, and time. She appears well-developed and well-nourished. No distress.  HENT:  Head: Normocephalic and atraumatic.  Neck: Normal range of motion.  Cardiovascular: Normal rate.   Respiratory: Effort normal.  GI: Soft. She exhibits no distension and no mass. There is no tenderness. There is no rebound and no guarding.  gravid  Genitourinary:  Genitourinary Comments: SVE: closed/thick  Musculoskeletal: Normal range of motion.  Neurological: She is alert and oriented to person, place, and time.  Skin: Skin is warm and dry.  Psychiatric: She has a normal mood and affect.   EFM: 135 bpm, mod variability, + accels, no decels Toco: none  Results for orders placed or performed during the hospital encounter of 09/19/16 (from the past 24 hour(s))  Urinalysis, Routine w reflex microscopic     Status: Abnormal   Collection Time: 09/19/16  4:56 PM  Result Value Ref Range   Color, Urine YELLOW YELLOW   APPearance HAZY (A) CLEAR   Specific Gravity, Urine 1.020 1.005 - 1.030   pH 6.0 5.0 - 8.0   Glucose, UA NEGATIVE NEGATIVE mg/dL   Hgb urine dipstick NEGATIVE NEGATIVE   Bilirubin Urine NEGATIVE NEGATIVE   Ketones, ur NEGATIVE NEGATIVE mg/dL   Protein, ur NEGATIVE NEGATIVE mg/dL   Nitrite NEGATIVE NEGATIVE   Leukocytes, UA SMALL (A) NEGATIVE   RBC / HPF  0-5 0 - 5 RBC/hpf   WBC, UA 6-30 0 - 5 WBC/hpf   Bacteria, UA RARE (A) NONE SEEN   Squamous Epithelial / LPF 6-30 (A) NONE SEEN   Mucous PRESENT     MAU Course  Procedures  MDM Labs ordered and reviewed. No evidence of acute abdominal or pelvic process. No evidence of UTI or PTL. Pain likely MSK d/t vomiting. Stable for discharge home.   Assessment and Plan   1. [redacted] weeks gestation of pregnancy   2. NST (non-stress test) reactive   3. Abdominal muscle pain    Discharge home Follow up as scheduled in office Rest Hydrate PTL precautions  Allergies as of 09/19/2016   No Known Allergies      Medication List    TAKE these medications   acetaminophen 500 MG tablet Commonly known as:  TYLENOL Take 1,000 mg by mouth every 6 (six) hours as needed for mild pain, moderate pain or headache.   CONCEPT DHA 53.5-38-1 MG Caps Take 1 capsule by mouth daily.      Julianne Handler, CNM 09/19/2016, 5:30 PM

## 2016-09-19 NOTE — Discharge Instructions (Signed)
Braxton Hicks Contractions Contractions of the uterus can occur throughout pregnancy. Contractions are not always a sign that you are in labor.  WHAT ARE BRAXTON HICKS CONTRACTIONS?  Contractions that occur before labor are called Braxton Hicks contractions, or false labor. Toward the end of pregnancy (32-34 weeks), these contractions can develop more often and may become more forceful. This is not true labor because these contractions do not result in opening (dilatation) and thinning of the cervix. They are sometimes difficult to tell apart from true labor because these contractions can be forceful and people have different pain tolerances. You should not feel embarrassed if you go to the hospital with false labor. Sometimes, the only way to tell if you are in true labor is for your health care provider to look for changes in the cervix. If there are no prenatal problems or other health problems associated with the pregnancy, it is completely safe to be sent home with false labor and await the onset of true labor. HOW CAN YOU TELL THE DIFFERENCE BETWEEN TRUE AND FALSE LABOR? False Labor   The contractions of false labor are usually shorter and not as hard as those of true labor.   The contractions are usually irregular.   The contractions are often felt in the front of the lower abdomen and in the groin.   The contractions may go away when you walk around or change positions while lying down.   The contractions get weaker and are shorter lasting as time goes on.   The contractions do not usually become progressively stronger, regular, and closer together as with true labor.  True Labor   Contractions in true labor last 30-70 seconds, become very regular, usually become more intense, and increase in frequency.   The contractions do not go away with walking.   The discomfort is usually felt in the top of the uterus and spreads to the lower abdomen and low back.   True labor can be  determined by your health care provider with an exam. This will show that the cervix is dilating and getting thinner.  WHAT TO REMEMBER  Keep up with your usual exercises and follow other instructions given by your health care provider.   Take medicines as directed by your health care provider.   Keep your regular prenatal appointments.   Eat and drink lightly if you think you are going into labor.   If Braxton Hicks contractions are making you uncomfortable:   Change your position from lying down or resting to walking, or from walking to resting.   Sit and rest in a tub of warm water.   Drink 2-3 glasses of water. Dehydration may cause these contractions.   Do slow and deep breathing several times an hour.  WHEN SHOULD I SEEK IMMEDIATE MEDICAL CARE? Seek immediate medical care if:  Your contractions become stronger, more regular, and closer together.   You have fluid leaking or gushing from your vagina.   You have a fever.   You pass blood-tinged mucus.   You have vaginal bleeding.   You have continuous abdominal pain.   You have low back pain that you never had before.   You feel your baby's head pushing down and causing pelvic pressure.   Your baby is not moving as much as it used to.  This information is not intended to replace advice given to you by your health care provider. Make sure you discuss any questions you have with your health care  provider. Document Released: 09/07/2005 Document Revised: 12/30/2015 Document Reviewed: 06/19/2013 Elsevier Interactive Patient Education  2017 [employer].

## 2016-09-21 LAB — CULTURE, OB URINE

## 2016-09-21 NOTE — L&D Delivery Note (Signed)
Delivery Note Pt labored precipitously and became complete at 0700; at 7:10 AM a viable female was delivered via Vaginal, Spontaneous Delivery (Presentation: ROA ).  APGAR: 8, 9; weight: pending .  Cord clamped and cut by FOB; hospital cord blood sample collected. Placenta status: spont ,intact .  Cord: 3 vessel  Anesthesia:  None Episiotomy: None Lacerations: None Est. Blood Loss (mL): 100  Mom to postpartum.  Baby to Couplet care / Skin to Skin.  Serita Grammes CNM 11/19/2016, 7:28 AM

## 2016-09-29 ENCOUNTER — Encounter: Payer: BC Managed Care – PPO | Admitting: Obstetrics and Gynecology

## 2016-10-19 ENCOUNTER — Encounter: Payer: Self-pay | Admitting: Obstetrics & Gynecology

## 2016-10-19 ENCOUNTER — Ambulatory Visit (INDEPENDENT_AMBULATORY_CARE_PROVIDER_SITE_OTHER): Payer: BC Managed Care – PPO | Admitting: Obstetrics & Gynecology

## 2016-10-19 VITALS — BP 127/87 | HR 103 | Wt 249.2 lb

## 2016-10-19 DIAGNOSIS — Z3403 Encounter for supervision of normal first pregnancy, third trimester: Secondary | ICD-10-CM

## 2016-10-19 DIAGNOSIS — K50813 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: Secondary | ICD-10-CM

## 2016-10-19 NOTE — Progress Notes (Signed)
   PRENATAL VISIT NOTE  Subjective:  Deanna [name] is a 21 y.o. G2P0010 at 57w4dbeing seen today for ongoing prenatal care.  She is currently monitored for the following issues for this high-risk pregnancy and has Keloid; Acne vulgaris; Supervision of normal pregnancy; 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[place]; 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[place]; and Anxiety disorder affecting pregnancy, antepartum on her problem list.  Patient reports no complaints.  Contractions: Irritability. Vag. Bleeding: None.  Movement: Present. Denies leaking of fluid.   The following portions of the patient's history were reviewed and updated as appropriate: allergies, current medications, past family history, past medical history, past social history, past surgical history and problem list. Problem list updated.  Objective:   Vitals:   10/19/16 1005  BP: 127/87  Pulse: (!) 103  Weight: 249 lb 3.2 oz (113 kg)    Fetal Status: Fetal Heart Rate (bpm): 140 Fundal Height: 35 cm Movement: Present     General:  Alert, oriented and cooperative. Patient is in no acute distress.  Skin: Skin is warm and dry. No rash noted.   Cardiovascular: Normal heart rate noted  Respiratory: Normal respiratory effort, no problems with respiration noted  Abdomen: Soft, gravid, appropriate for gestational age. Pain/Pressure: Present     Pelvic:  Cervical exam deferred        Extremities: Normal range of motion.  Edema: Trace  Mental Status: Normal mood and affect. Normal behavior. Normal judgment and thought content.   Assessment and Plan:  Pregnancy: G2P0010 at 339w4d1.  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  No diarrhea.  2.  Cultures next week.  Encouraged to take birthing class and tour.    There are no diagnoses linked to this encounter. Preterm labor symptoms and general obstetric precautions including but not limited to vaginal bleeding, contractions, leaking of fluid and fetal movement were  reviewed in detail with the patient. Please refer to After Visit Summary for other counseling recommendations.  Return in about 2 weeks (around 11/02/2016).   KeGuss BundeMD

## 2016-10-19 NOTE — Patient Instructions (Signed)
Vaginal Delivery Vaginal delivery means that you will give birth by pushing your baby out of your birth canal (vagina). A team of health care providers will help you before, during, and after vaginal delivery. Birth experiences are unique for every woman and every pregnancy, and birth experiences vary depending on where you choose to give birth. What should I do to prepare for my baby's birth? Before your baby is born, it is important to talk with your health care provider about:  Your labor and delivery preferences. These may include:  Medicines that you may be given.  How you will manage your pain. This might include non-medical pain relief techniques or injectable pain relief such as epidural analgesia.  How you and your baby will be monitored during labor and delivery.  Who may be in the labor and delivery room with you.  Your feelings about surgical delivery of your baby (cesarean delivery, or C-section) if this becomes necessary.  Your feelings about receiving donated blood through an IV tube (blood transfusion) if this becomes necessary.  Whether you are able:  To take pictures or videos of the birth.  To eat during labor and delivery.  To move around, walk, or change positions during labor and delivery.  What to expect after your baby is born, such as:  Whether delayed umbilical cord clamping and cutting is offered.  Who will care for your baby right after birth.  Medicines or tests that may be recommended for your baby.  Whether breastfeeding is supported in your hospital or birth center.  How long you will be in the hospital or birth center.  How any medical conditions you have may affect your baby or your labor and delivery experience. To prepare for your baby's birth, you should also:  Attend all of your health care visits before delivery (prenatal visits) as recommended by your health care provider. This is important.  Prepare your home for your baby's  arrival. Make sure that you have:  Diapers.  Baby clothing.  Feeding equipment.  Safe sleeping arrangements for you and your baby.  Install a car seat in your vehicle. Have your car seat checked by a certified car seat installer to make sure that it is installed safely.  Think about who will help you with your new baby at home for at least the first several weeks after delivery. What can I expect when I arrive at the birth center or hospital? Once you are in labor and have been admitted into the hospital or birth center, your health care provider may:  Review your pregnancy history and any concerns you have.  Insert an IV tube into one of your veins. This is used to give you fluids and medicines.  Check your blood pressure, pulse, temperature, and heart rate (vital signs).  Check whether your bag of water (amniotic sac) has broken (ruptured).  Talk with you about your birth plan and discuss pain control options. Monitoring Your health care provider may monitor your contractions (uterine monitoring) and your baby's heart rate (fetal monitoring). You may need to be monitored:  Often, but not continuously (intermittently).  All the time or for long periods at a time (continuously). Continuous monitoring may be needed if:  You are taking certain medicines, such as medicine to relieve pain or make your contractions stronger.  You have pregnancy or labor complications. Monitoring may be done by:  Placing a special stethoscope or a handheld monitoring device on your abdomen to check your baby's heartbeat,  and feeling your abdomen for contractions. This method of monitoring does not continuously record your baby's heartbeat or your contractions.  Placing monitors on your abdomen (external monitors) to record your baby's heartbeat and the frequency and length of contractions. You may not have to wear external monitors all the time.  Placing monitors inside of your uterus (internal  monitors) to record your baby's heartbeat and the frequency, length, and strength of your contractions.  Your health care provider may use internal monitors if he or she needs more information about the strength of your contractions or your baby's heart rate.  Internal monitors are put in place by passing a thin, flexible wire through your vagina and into your uterus. Depending on the type of monitor, it may remain in your uterus or on your baby's head until birth.  Your health care provider will discuss the benefits and risks of internal monitoring with you and will ask for your permission before inserting the monitors.  Telemetry. This is a type of continuous monitoring that can be done with external or internal monitors. Instead of having to stay in bed, you are able to move around during telemetry. Ask your health care provider if telemetry is an option for you. Physical exam Your health care provider may perform a physical exam. This may include:  Checking whether your baby is positioned:  With the head toward your vagina (head-down). This is most common.  With the head toward the top of your uterus (head-up or breech). If your baby is in a breech position, your health care provider may try to turn your baby to a head-down position so you can deliver vaginally. If it does not seem that your baby can be born vaginally, your provider may recommend surgery to deliver your baby. In rare cases, you may be able to deliver vaginally if your baby is head-up (breech delivery).  Lying sideways (transverse). Babies that are lying sideways cannot be delivered vaginally.  Checking your cervix to determine:  Whether it is thinning out (effacing).  Whether it is opening up (dilating).  How low your baby has moved into your birth canal. What are the three stages of labor and delivery?   Normal labor and delivery is divided into the following three stages: Stage 1  Stage 1 is the longest stage of  labor, and it can last for hours or days. Stage 1 includes:  Early labor. This is when contractions may be irregular, or regular and mild. Generally, early labor contractions are more than 10 minutes apart.  Active labor. This is when contractions get longer, more regular, more frequent, and more intense.  The transition phase. This is when contractions happen very close together, are very intense, and may last longer than during any other part of labor.  Contractions generally feel mild, infrequent, and irregular at first. They get stronger, more frequent (about every 2-3 minutes), and more regular as you progress from early labor through active labor and transition.  Many women progress through stage 1 naturally, but you may need help to continue making progress. If this happens, your health care provider may talk with you about:  Rupturing your amniotic sac if it has not ruptured yet.  Giving you medicine to help make your contractions stronger and more frequent.  Stage 1 ends when your cervix is completely dilated to 4 inches (10 cm) and completely effaced. This happens at the end of the transition phase. Stage 2  Once your cervix is completely effaced  and dilated to 4 inches (10 cm), you may start to feel an urge to push. It is common for the body to naturally take a rest before feeling the urge to push, especially if you received an epidural or certain other pain medicines. This rest period may last for up to 1-2 hours, depending on your unique labor experience.  During stage 2, contractions are generally less painful, because pushing helps relieve contraction pain. Instead of contraction pain, you may feel stretching and burning pain, especially when the widest part of your baby's head passes through the vaginal opening (crowning).  Your health care provider will closely monitor your pushing progress and your baby's progress through the vagina during stage 2.  Your health care  provider may massage the area of skin between your vaginal opening and anus (perineum) or apply warm compresses to your perineum. This helps it stretch as the baby's head starts to crown, which can help prevent perineal tearing.  In some cases, an incision may be made in your perineum (episiotomy) to allow the baby to pass through the vaginal opening. An episiotomy helps to make the opening of the vagina larger to allow more room for the baby to fit through.  It is very important to breathe and focus so your health care provider can control the delivery of your baby's head. Your health care provider may have you decrease the intensity of your pushing, to help prevent perineal tearing.  After delivery of your baby's head, the shoulders and the rest of the body generally deliver very quickly and without difficulty.  Once your baby is delivered, the umbilical cord may be cut right away, or this may be delayed for 1-2 minutes, depending on your baby's health. This may vary among health care providers, hospitals, and birth centers.  If you and your baby are healthy enough, your baby may be placed on your chest or abdomen to help maintain the baby's temperature and to help you bond with each other. Some mothers and babies start breastfeeding at this time. Your health care team will dry your baby and help keep your baby warm during this time.  Your baby may need immediate care if he or she:  Showed signs of distress during labor.  Has a medical condition.  Was born too early (prematurely).  Had a bowel movement before birth (meconium).  Shows signs of difficulty transitioning from being inside the uterus to being outside of the uterus. If you are planning to breastfeed, your health care team will help you begin a feeding. Stage 3  The third stage of labor starts immediately after the birth of your baby and ends after you deliver the placenta. The placenta is an organ that develops during pregnancy  to provide oxygen and nutrients to your baby in the womb.  Delivering the placenta may require some pushing, and you may have mild contractions. Breastfeeding can stimulate contractions to help you deliver the placenta.  After the placenta is delivered, your uterus should tighten (contract) and become firm. This helps to stop bleeding in your uterus. To help your uterus contract and to control bleeding, your health care provider may:  Give you medicine by injection, through an IV tube, by mouth, or through your rectum (rectally).  Massage your abdomen or perform a vaginal exam to remove any blood clots that are left in your uterus.  Empty your bladder by placing a thin, flexible tube (catheter) into your bladder.  Encourage you to breastfeed your baby.  After labor is over, you and your baby will be monitored closely to ensure that you are both healthy until you are ready to go home. Your health care team will teach you how to care for yourself and your baby. This information is not intended to replace advice given to you by your health care provider. Make sure you discuss any questions you have with your health care provider. Document Released: 06/16/2008 Document Revised: 03/27/2016 Document Reviewed: 09/22/2015 Elsevier Interactive Patient Education  2017 [employer] of the uterus can occur throughout pregnancy. Contractions are not always a sign that you are in labor.  WHAT ARE BRAXTON HICKS CONTRACTIONS?  Contractions that occur before labor are called Braxton Hicks contractions, or false labor. Toward the end of pregnancy (32-34 weeks), these contractions can develop more often and may become more forceful. This is not true labor because these contractions do not result in opening (dilatation) and thinning of the cervix. They are sometimes difficult to tell apart from true labor because these contractions can be forceful and people have different  pain tolerances. You should not feel embarrassed if you go to the hospital with false labor. Sometimes, the only way to tell if you are in true labor is for your health care provider to look for changes in the cervix. If there are no prenatal problems or other health problems associated with the pregnancy, it is completely safe to be sent home with false labor and await the onset of true labor. HOW CAN YOU TELL THE DIFFERENCE BETWEEN TRUE AND FALSE LABOR? False Labor   The contractions of false labor are usually shorter and not as hard as those of true labor.   The contractions are usually irregular.   The contractions are often felt in the front of the lower abdomen and in the groin.   The contractions may go away when you walk around or change positions while lying down.   The contractions get weaker and are shorter lasting as time goes on.   The contractions do not usually become progressively stronger, regular, and closer together as with true labor.  True Labor   Contractions in true labor last 30-70 seconds, become very regular, usually become more intense, and increase in frequency.   The contractions do not go away with walking.   The discomfort is usually felt in the top of the uterus and spreads to the lower abdomen and low back.   True labor can be determined by your health care provider with an exam. This will show that the cervix is dilating and getting thinner.  WHAT TO REMEMBER  Keep up with your usual exercises and follow other instructions given by your health care provider.   Take medicines as directed by your health care provider.   Keep your regular prenatal appointments.   Eat and drink lightly if you think you are going into labor.   If Braxton Hicks contractions are making you uncomfortable:   Change your position from lying down or resting to walking, or from walking to resting.   Sit and rest in a tub of warm water.   Drink 2-3  glasses of water. Dehydration may cause these contractions.   Do slow and deep breathing several times an hour.  WHEN SHOULD I SEEK IMMEDIATE MEDICAL CARE? Seek immediate medical care if:  Your contractions become stronger, more regular, and closer together.   You have fluid leaking or gushing from your vagina.   You have  a fever.   You pass blood-tinged mucus.   You have vaginal bleeding.   You have continuous abdominal pain.   You have low back pain that you never had before.   You feel your baby's head pushing down and causing pelvic pressure.   Your baby is not moving as much as it used to.  This information is not intended to replace advice given to you by your health care provider. Make sure you discuss any questions you have with your health care provider. Document Released: 09/07/2005 Document Revised: 12/30/2015 Document Reviewed: 06/19/2013 Elsevier Interactive Patient Education  2017 [employer].

## 2016-10-21 ENCOUNTER — Encounter (HOSPITAL_COMMUNITY): Payer: Self-pay | Admitting: *Deleted

## 2016-10-21 ENCOUNTER — Inpatient Hospital Stay (HOSPITAL_COMMUNITY)
Admission: AD | Admit: 2016-10-21 | Discharge: 2016-10-21 | Disposition: A | Discharge status: Home / Self Care | Payer: BC Managed Care – PPO | Source: Ambulatory Visit | Attending: Obstetrics & Gynecology | Admitting: Obstetrics & Gynecology

## 2016-10-21 DIAGNOSIS — O219 Vomiting of pregnancy, unspecified: Secondary | ICD-10-CM

## 2016-10-21 DIAGNOSIS — O99513 Diseases of the respiratory system complicating pregnancy, third trimester: Secondary | ICD-10-CM | POA: Insufficient documentation

## 2016-10-21 DIAGNOSIS — J209 Acute bronchitis, unspecified: Secondary | ICD-10-CM | POA: Insufficient documentation

## 2016-10-21 DIAGNOSIS — Z3A35 35 weeks gestation of pregnancy: Secondary | ICD-10-CM | POA: Diagnosis not present

## 2016-10-21 DIAGNOSIS — O210 Mild hyperemesis gravidarum: Secondary | ICD-10-CM | POA: Diagnosis not present

## 2016-10-21 DIAGNOSIS — R05 Cough: Secondary | ICD-10-CM | POA: Diagnosis present

## 2016-10-21 HISTORY — DX: Crohn's disease, unspecified, without complications: K50.90

## 2016-10-21 LAB — URINALYSIS, ROUTINE W REFLEX MICROSCOPIC
BILIRUBIN URINE: NEGATIVE
GLUCOSE, UA: NEGATIVE mg/dL
Hgb urine dipstick: NEGATIVE
KETONES UR: NEGATIVE mg/dL
Nitrite: NEGATIVE
PH: 6 (ref 5.0–8.0)
PROTEIN: NEGATIVE mg/dL
Specific Gravity, Urine: 1.024 (ref 1.005–1.030)

## 2016-10-21 LAB — RAPID STREP SCREEN (MED CTR MEBANE ONLY): STREPTOCOCCUS, GROUP A SCREEN (DIRECT): NEGATIVE

## 2016-10-21 LAB — INFLUENZA PANEL BY PCR (TYPE A & B)
Influenza A By PCR: NEGATIVE
Influenza B By PCR: NEGATIVE

## 2016-10-21 MED ORDER — PROMETHAZINE HCL 25 MG PO TABS: 25.0000 mg | ORAL_TABLET | Freq: Four times a day (QID) | ORAL | 0 refills | Status: DC | PRN

## 2016-10-21 MED ORDER — BENZONATATE 100 MG PO CAPS
200.0000 mg | ORAL_CAPSULE | Freq: Once | ORAL | Status: AC
  Administered 2016-10-21: 200 mg via ORAL
  Filled 2016-10-21: qty 2

## 2016-10-21 MED ORDER — ALBUTEROL SULFATE (2.5 MG/3ML) 0.083% IN NEBU: 2.5000 mg | INHALATION_SOLUTION | Freq: Once | RESPIRATORY_TRACT | Status: DC

## 2016-10-21 MED ORDER — IPRATROPIUM-ALBUTEROL 0.5-2.5 (3) MG/3ML IN SOLN
3.0000 mL | Freq: Once | RESPIRATORY_TRACT | Status: AC
  Administered 2016-10-21: 3 mL via RESPIRATORY_TRACT
  Filled 2016-10-21: qty 3

## 2016-10-21 MED ORDER — IPRATROPIUM BROMIDE 0.02 % IN SOLN: 0.5000 mg | Freq: Once | RESPIRATORY_TRACT | Status: DC

## 2016-10-21 NOTE — MAU Note (Signed)
Has been taking Tylenol and Robitussin DM at home;

## 2016-10-21 NOTE — MAU Note (Signed)
C/o symptoms for last 5 days; ? Flu;

## 2016-10-21 NOTE — MAU Note (Signed)
This morning she threw up, has been having a lot of drainage- gags her.  Cough, sneeze, sore throat, body aches - symptoms started last Sat, getting worse. Upper chest pain and pain in ribs- esp with cough. headache

## 2016-10-21 NOTE — [facility] Provider Note (Signed)
History     CSN: [id number]  Arrival date and time: 10/21/16 1554   First Provider Initiated Contact with Patient 10/21/16 1637      Chief Complaint  Patient presents with  . flu symptoms   HPI  PRISEIS CRATTY is a 21 y.o. G2P0010 at 75w6dwho presents with cough, sore throat, & n/v. Symptoms started 5 days ago. Significant other has been sick with same symptoms. Reports non productive cough. Has been taking robitussin with moderate relief. Reports chest pain associated with coughing that is worse in right lower ribs. Denies SOB or hx of asthma.  Nausea & vomiting daily with symptoms. Does not have antiemetic at home. Vomited twice today. Denies abdominal pain, diarrhea, heartburn, or constipation. Currently not nauseated.  Throat pain that is worse with swallowing and coughing. Rates pain 8/10. Has taken tylenol with mild relief. Denies fever/chills, sinus pain, or ear pain. Positive fetal movement.    OB History    Gravida Para Term Preterm AB Living   2 0 0 0 1 0   SAB TAB Ectopic Multiple Live Births   0 1 0 0 0      Past Medical History:  Diagnosis Date  . 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 Crohn disease ([facility]   . Inflammatory bowel disease    chrohn's disease    Past Surgical History:  Procedure Laterality Date  . NO PAST SURGERIES      Family History  Problem Relation Age of Onset  . Diabetes Maternal Grandmother     Social History  Substance Use Topics  . Smoking status: Never Smoker  . Smokeless tobacco: Never Used  . Alcohol use No    Allergies: No Known Allergies  Prescriptions Prior to Admission  Medication Sig Dispense Refill Last Dose  . acetaminophen (TYLENOL) 500 MG tablet Take 1,000 mg by mouth every 6 (six) hours as needed for mild pain, moderate pain or headache.   Taking  . Prenat-FeFum-FePo-FA-Omega 3 (CONCEPT DHA) 53.5-38-1 MG CAPS Take 1 capsule by mouth daily.   Taking  . zolpidem (AMBIEN) 10 MG tablet Take 10 mg by mouth at  bedtime as needed. for sleep  3     Review of Systems  Constitutional: Negative.   HENT: Positive for congestion, postnasal drip, rhinorrhea, sneezing and sore throat. Negative for drooling, ear pain, sinus pain and trouble swallowing.   Respiratory: Positive for cough, chest tightness and wheezing. Negative for shortness of breath.   Cardiovascular: Negative.   Gastrointestinal: Positive for nausea and vomiting. Negative for abdominal pain, constipation and diarrhea.  Genitourinary: Negative.    Physical Exam   Blood pressure 120/61, pulse 105, temperature 98.6 F (37 C), temperature source Oral, resp. rate 20, height 5' 7"  (1.702 m), weight 248 lb 9.6 oz (112.8 kg), last menstrual period 02/16/2016, SpO2 100 %.  Physical Exam  Nursing note and vitals reviewed. Constitutional: She is oriented to person, place, and time. She appears well-developed and well-nourished. No distress.  HENT:  Head: Normocephalic and atraumatic.  Right Ear: Tympanic membrane normal.  Left Ear: Tympanic membrane normal.  Nose: Nose normal.  Mouth/Throat: Posterior oropharyngeal erythema present. No oropharyngeal exudate or posterior oropharyngeal edema.  Eyes: Conjunctivae are normal. Right eye exhibits no discharge. Left eye exhibits no discharge. No scleral icterus.  Neck: Normal range of motion.  Cardiovascular: Normal rate, regular rhythm and normal heart sounds.   No murmur heard. Respiratory: Effort normal and breath sounds normal. No respiratory distress. She has  no wheezes.  GI: Soft.  Lymphadenopathy:       Head (right side): No submental and no submandibular adenopathy present.       Head (left side): No submental and no submandibular adenopathy present.  Neurological: She is alert and oriented to person, place, and time.  Skin: Skin is warm and dry. She is not diaphoretic.  Psychiatric: She has a normal mood and affect. Her behavior is normal. Judgment and thought content normal.   Fetal  Tracing:  Baseline: 150 Variability: moderate Accelerations: 15x15 Decelerations: none  Toco: none MAU Course  Procedures Results for orders placed or performed during the hospital encounter of 10/21/16 (from the past 24 hour(s))  Urinalysis, Routine w reflex microscopic     Status: Abnormal   Collection Time: 10/21/16  4:10 PM  Result Value Ref Range   Color, Urine YELLOW YELLOW   APPearance HAZY (A) CLEAR   Specific Gravity, Urine 1.024 1.005 - 1.030   pH 6.0 5.0 - 8.0   Glucose, UA NEGATIVE NEGATIVE mg/dL   Hgb urine dipstick NEGATIVE NEGATIVE   Bilirubin Urine NEGATIVE NEGATIVE   Ketones, ur NEGATIVE NEGATIVE mg/dL   Protein, ur NEGATIVE NEGATIVE mg/dL   Nitrite NEGATIVE NEGATIVE   Leukocytes, UA TRACE (A) NEGATIVE   RBC / HPF 0-5 0 - 5 RBC/hpf   WBC, UA 0-5 0 - 5 WBC/hpf   Bacteria, UA RARE (A) NONE SEEN   Squamous Epithelial / LPF 6-30 (A) NONE SEEN   Mucous PRESENT   Influenza panel by PCR (type A & B)     Status: None   Collection Time: 10/21/16  4:35 PM  Result Value Ref Range   Influenza A By PCR NEGATIVE NEGATIVE   Influenza B By PCR NEGATIVE NEGATIVE    MDM Reactive fetal tracing VSS Flu swab -- negative Neb tx for wheezing & chest tightness -- reports improvement in symptoms Tessalon 200 mg PO Strep swab pending Assessment and Plan  A: 1. Acute bronchitis, unspecified organism   2. Nausea and vomiting during pregnancy    P: Discharge home Rx promethazine Discussed OTC meds for symptomatic treatment Strep swab pending Discussed reasons to return to MAU Keep f/u with OB  Jorje Guild 10/21/2016, 4:37 PM

## 2016-10-21 NOTE — Discharge Instructions (Signed)
Safe Medications in Pregnancy   Acne: Benzoyl Peroxide Salicylic Acid  Backache/Headache: Tylenol: 2 regular strength every 4 hours OR              2 Extra strength every 6 hours  Colds/Coughs/Allergies: Benadryl (alcohol free) 25 mg every 6 hours as needed Breath right strips Claritin Cepacol throat lozenges Chloraseptic throat spray Cold-Eeze- up to three times per day Cough drops, alcohol free Flonase (by prescription only) Guaifenesin Mucinex Robitussin DM (plain only, alcohol free) Saline nasal spray/drops Sudafed (pseudoephedrine) & Actifed ** use only after [redacted] weeks gestation and if you do not have high blood pressure Tylenol Vicks Vaporub Zinc lozenges Zyrtec   Constipation: Colace Ducolax suppositories Fleet enema Glycerin suppositories Metamucil Milk of magnesia Miralax Senokot Smooth move tea  Diarrhea: Kaopectate Imodium A-D  *NO pepto Bismol  Hemorrhoids: Anusol Anusol HC Preparation H Tucks  Indigestion: Tums Maalox Mylanta Zantac  Pepcid  Insomnia: Benadryl (alcohol free) 70m every 6 hours as needed Tylenol PM Unisom, no Gelcaps  Leg Cramps: Tums MagGel  Nausea/Vomiting:  Bonine Dramamine Emetrol Ginger extract Sea bands Meclizine  Nausea medication to take during pregnancy:  Unisom (doxylamine succinate 25 mg tablets) Take one tablet daily at bedtime. If symptoms are not adequately controlled, the dose can be increased to a maximum recommended dose of two tablets daily (1/2 tablet in the morning, 1/2 tablet mid-afternoon and one at bedtime). Vitamin B6 1057mtablets. Take one tablet twice a day (up to 200 mg per day).  Skin Rashes: Aveeno products Benadryl cream or 2568mvery 6 hours as needed Calamine Lotion 1% cortisone cream  Yeast infection: Gyne-lotrimin 7 Monistat 7  Gum/tooth pain: Anbesol  **If taking multiple medications, please check labels to avoid duplicating the same active ingredients **take  medication as directed on the label ** Do not exceed 4000 mg of tylenol in 24 hours **Do not take medications that contain aspirin or ibuprofen       Acute Bronchitis, Adult Acute bronchitis is sudden (acute) swelling of the air tubes (bronchi) in the lungs. Acute bronchitis causes these tubes to fill with mucus, which can make it hard to breathe. It can also cause coughing or wheezing. In adults, acute bronchitis usually goes away within 2 weeks. A cough caused by bronchitis may last up to 3 weeks. Smoking, allergies, and asthma can make the condition worse. Repeated episodes of bronchitis may cause further lung problems, such as chronic obstructive pulmonary disease (COPD). What are the causes? This condition can be caused by germs and by substances that irritate the lungs, including:  Cold and flu viruses. This condition is most often caused by the same virus that causes a cold.  Bacteria.  Exposure to tobacco smoke, dust, fumes, and air pollution. What increases the risk? This condition is more likely to develop in people who:  Have close contact with someone with acute bronchitis.  Are exposed to lung irritants, such as tobacco smoke, dust, fumes, and vapors.  Have a weak immune system.  Have a respiratory condition such as asthma. What are the signs or symptoms? Symptoms of this condition include:  A cough.  Coughing up clear, yellow, or green mucus.  Wheezing.  Chest congestion.  Shortness of breath.  A fever.  Body aches.  Chills.  A sore throat. How is this diagnosed? This condition is usually diagnosed with a physical exam. During the exam, your health care provider may order tests, such as chest X-rays, to rule out other conditions. He  or she may also:  Test a sample of your mucus for bacterial infection.  Check the level of oxygen in your blood. This is done to check for pneumonia.  Do a chest X-ray or lung function testing to rule out pneumonia  and other conditions.  Perform blood tests. Your health care provider will also ask about your symptoms and medical history. How is this treated? Most cases of acute bronchitis clear up over time without treatment. Your health care provider may recommend:  Drinking more fluids. Drinking more makes your mucus thinner, which may make it easier to breathe.  Taking a medicine for a fever or cough.  Using a cool mist vaporizer or humidifier to make it easier to breathe. Follow these instructions at home: Medicines  Take over-the-counter and prescription medicines only as told by your health care provider.  If you were prescribed an antibiotic, take it as told by your health care provider. Do not stop taking the antibiotic even if you start to feel better. General instructions  Get plenty of rest.  Drink enough fluids to keep your urine clear or pale yellow.  Avoid smoking and secondhand smoke. Exposure to cigarette smoke or irritating chemicals will make bronchitis worse. If you smoke and you need help quitting, ask your health care provider. Quitting smoking will help your lungs heal faster.  Use an inhaler, cool mist vaporizer, or humidifier as told by your health care provider.  Keep all follow-up visits as told by your health care provider. This is important. How is this prevented? To lower your risk of getting this condition again:  Wash your hands often with soap and water. If soap and water are not available, use hand sanitizer.  Avoid contact with people who have cold symptoms.  Try not to touch your hands to your mouth, nose, or eyes.  Make sure to get the flu shot every year. Contact a health care provider if:  Your symptoms do not improve in 2 weeks of treatment. Get help right away if:  You cough up blood.  You have chest pain.  You have severe shortness of breath.  You become dehydrated.  You faint or keep feeling like you are going to faint.  You keep  vomiting.  You have a severe headache.  Your fever or chills gets worse. This information is not intended to replace advice given to you by your health care provider. Make sure you discuss any questions you have with your health care provider. Document Released: 10/15/2004 Document Revised: 04/01/2016 Document Reviewed: 02/26/2016 Elsevier Interactive Patient Education  2017 [employer].

## 2016-10-24 LAB — CULTURE, GROUP A STREP (THRC)

## 2016-11-05 ENCOUNTER — Ambulatory Visit (INDEPENDENT_AMBULATORY_CARE_PROVIDER_SITE_OTHER): Payer: BC Managed Care – PPO | Admitting: Family Medicine

## 2016-11-05 VITALS — BP 102/73 | HR 96 | Wt 247.9 lb

## 2016-11-05 DIAGNOSIS — K50813 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: Secondary | ICD-10-CM

## 2016-11-05 DIAGNOSIS — Z3403 Encounter for supervision of normal first pregnancy, third trimester: Secondary | ICD-10-CM

## 2016-11-05 NOTE — Progress Notes (Signed)
   PRENATAL VISIT NOTE  Subjective:  Deanna [name] is a 21 y.o. G2P0010 at 63w0dbeing seen today for ongoing prenatal care.  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  Patient reports no complaints.  Contractions: Irritability.  .  Movement: Present. Denies leaking of fluid.   The following portions of the patient's history were reviewed and updated as appropriate: allergies, current medications, past family history, past medical history, past social history, past surgical history and problem list. Problem list updated.  Objective:   Vitals:   11/05/16 1141  BP: 102/73  Pulse: 96  Weight: 247 lb 14.4 oz (112.4 kg)    Fetal Status: Fetal Heart Rate (bpm): 135 Fundal Height: 37 cm Movement: Present  Presentation: Vertex  General:  Alert, oriented and cooperative. Patient is in no acute distress.  Skin: Skin is warm and dry. No rash noted.   Cardiovascular: Normal heart rate noted  Respiratory: Normal respiratory effort, no problems with respiration noted  Abdomen: Soft, gravid, appropriate for gestational age. Pain/Pressure: Present     Pelvic:  Cervical exam performed Dilation: 1 Effacement (%): 50 Station: -2  Extremities: Normal range of motion.     Mental Status: Normal mood and affect. Normal behavior. Normal judgment and thought content.   Assessment and Plan:  Pregnancy: G2P0010 at 322w0d1. 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 Encounter for supervision of normal first pregnancy in third trimester Labor precautions reviewed  Term labor symptoms and general obstetric precautions including but not limited to vaginal bleeding,  contractions, leaking of fluid and fetal movement were reviewed in detail with the patient. Please refer to After Visit Summary for other counseling recommendations.  Return in 1 week (on 11/12/2016).   TaDonnamae JudeMD

## 2016-11-05 NOTE — Patient Instructions (Signed)
Third Trimester of Pregnancy The third trimester is from week 29 through week 40 (months 7 through 9). The third trimester is a time when the unborn baby (fetus) is growing rapidly. At the end of the ninth month, the fetus is about 20 inches in length and weighs 6-10 pounds. Body changes during your third trimester Your body goes through many changes during pregnancy. The changes vary from woman to woman. During the third trimester:  Your weight will continue to increase. You can expect to gain 25-35 pounds (11-16 kg) by the end of the pregnancy.  You may begin to get stretch marks on your hips, abdomen, and breasts.  You may urinate more often because the fetus is moving lower into your pelvis and pressing on your bladder.  You may develop or continue to have heartburn. This is caused by increased hormones that slow down muscles in the digestive tract.  You may develop or continue to have constipation because increased hormones slow digestion and cause the muscles that push waste through your intestines to relax.  You may develop hemorrhoids. These are swollen veins (varicose veins) in the rectum that can itch or be painful.  You may develop swollen, bulging veins (varicose veins) in your legs.  You may have increased body aches in the pelvis, back, or thighs. This is due to weight gain and increased hormones that are relaxing your joints.  You may have changes in your hair. These can include thickening of your hair, rapid growth, and changes in texture. Some women also have hair loss during or after pregnancy, or hair that feels dry or thin. Your hair will most likely return to normal after your baby is born.  Your breasts will continue to grow and they will continue to become tender. A yellow fluid (colostrum) may leak from your breasts. This is the first milk you are producing for your baby.  Your belly button may stick out.  You may notice more swelling in your hands, face, or  ankles.  You may have increased tingling or numbness in your hands, arms, and legs. The skin on your belly may also feel numb.  You may feel short of breath because of your expanding uterus.  You may have more problems sleeping. This can be caused by the size of your belly, increased need to urinate, and an increase in your body's metabolism.  You may notice the fetus "dropping," or moving lower in your abdomen.  You may have increased vaginal discharge.  Your cervix becomes thin and soft (effaced) near your due date. What to expect at prenatal visits You will have prenatal exams every 2 weeks until week 36. Then you will have weekly prenatal exams. During a routine prenatal visit:  You will be weighed to make sure you and the fetus are growing normally.  Your blood pressure will be taken.  Your abdomen will be measured to track your baby's growth.  The fetal heartbeat will be listened to.  Any test results from the previous visit will be discussed.  You may have a cervical check near your due date to see if you have effaced. At around 36 weeks, your health care provider will check your cervix. At the same time, your health care provider will also perform a test on the secretions of the vaginal tissue. This test is to determine if a type of bacteria, Group B streptococcus, is present. Your health care provider will explain this further. Your health care provider may ask you:  What your birth plan is.  How you are feeling.  If you are feeling the baby move.  If you have had any abnormal symptoms, such as leaking fluid, bleeding, severe headaches, or abdominal cramping.  If you are using any tobacco products, including cigarettes, chewing tobacco, and electronic cigarettes.  If you have any questions. Other tests or screenings that may be performed during your third trimester include:  Blood tests that check for low iron levels (anemia).  Fetal testing to check the health,  activity level, and growth of the fetus. Testing is done if you have certain medical conditions or if there are problems during the pregnancy.  Nonstress test (NST). This test checks the health of your baby to make sure there are no signs of problems, such as the baby not getting enough oxygen. During this test, a belt is placed around your belly. The baby is made to move, and its heart rate is monitored during movement. What is false labor? False labor is a condition in which you feel small, irregular tightenings of the muscles in the womb (contractions) that eventually go away. These are called Braxton Hicks contractions. Contractions may last for hours, days, or even weeks before true labor sets in. If contractions come at regular intervals, become more frequent, increase in intensity, or become painful, you should see your health care provider. What are the signs of labor?  Abdominal cramps.  Regular contractions that start at 10 minutes apart and become stronger and more frequent with time.  Contractions that start on the top of the uterus and spread down to the lower abdomen and back.  Increased pelvic pressure and dull back pain.  A watery or bloody mucus discharge that comes from the vagina.  Leaking of amniotic fluid. This is also known as your "water breaking." It could be a slow trickle or a gush. Let your doctor know if it has a color or strange odor. If you have any of these signs, call your health care provider right away, even if it is before your due date. Follow these instructions at home: Eating and drinking  Continue to eat regular, healthy meals.  Do not eat:  Raw meat or meat spreads.  Unpasteurized milk or cheese.  Unpasteurized juice.  Store-made salad.  Refrigerated smoked seafood.  Hot dogs or deli meat, unless they are piping hot.  More than 6 ounces of albacore tuna a week.  Shark, swordfish, king mackerel, or tile fish.  Store-made salads.  Raw  sprouts, such as mung bean or alfalfa sprouts.  Take prenatal vitamins as told by your health care provider.  Take 1000 mg of calcium daily as told by your health care provider.  If you develop constipation:  Take over-the-counter or prescription medicines.  Drink enough fluid to keep your urine clear or pale yellow.  Eat foods that are high in fiber, such as fresh fruits and vegetables, whole grains, and beans.  Limit foods that are high in fat and processed sugars, such as fried and sweet foods. Activity  Exercise only as directed by your health care provider. Healthy pregnant women should aim for 2 hours and 30 minutes of moderate exercise per week. If you experience any pain or discomfort while exercising, stop.  Avoid heavy lifting.  Do not exercise in extreme heat or humidity, or at high altitudes.  Wear low-heel, comfortable shoes.  Practice good posture.  Do not travel far distances unless it is absolutely necessary and only with the approval  of your health care provider.  Wear your seat belt at all times while in a car, on a bus, or on a plane.  Take frequent breaks and rest with your legs elevated if you have leg cramps or low back pain.  Do not use hot tubs, steam rooms, or saunas.  You may continue to have sex unless your health care provider tells you otherwise. Lifestyle  Do not use any products that contain nicotine or tobacco, such as cigarettes and e-cigarettes. If you need help quitting, ask your health care provider.  Do not drink alcohol.  Do not use any medicinal herbs or unprescribed drugs. These chemicals affect the formation and growth of the baby.  If you develop varicose veins:  Wear support pantyhose or compression stockings as told by your healthcare provider.  Elevate your feet for 15 minutes, 3-4 times a day.  Wear a supportive maternity bra to help with breast tenderness. General instructions  Take over-the-counter and prescription  medicines only as told by your health care provider. There are medicines that are either safe or unsafe to take during pregnancy.  Take warm sitz baths to soothe any pain or discomfort caused by hemorrhoids. Use hemorrhoid cream or witch hazel if your health care provider approves.  Avoid cat litter boxes and soil used by cats. These carry germs that can cause birth defects in the baby. If you have a cat, ask someone to clean the litter box for you.  To prepare for the arrival of your baby:  Take prenatal classes to understand, practice, and ask questions about the labor and delivery.  Make a trial run to the hospital.  Visit the hospital and tour the maternity area.  Arrange for maternity or paternity leave through employers.  Arrange for family and friends to take care of pets while you are in the hospital.  Purchase a rear-facing car seat and make sure you know how to install it in your car.  Pack your hospital bag.  Prepare the baby's nursery. Make sure to remove all pillows and stuffed animals from the baby's crib to prevent suffocation.  Visit your dentist if you have not gone during your pregnancy. Use a soft toothbrush to brush your teeth and be gentle when you floss.  Keep all prenatal follow-up visits as told by your health care provider. This is important. Contact a health care provider if:  You are unsure if you are in labor or if your water has broken.  You become dizzy.  You have mild pelvic cramps, pelvic pressure, or nagging pain in your abdominal area.  You have lower back pain.  You have persistent nausea, vomiting, or diarrhea.  You have an unusual or bad smelling vaginal discharge.  You have pain when you urinate. Get help right away if:  You have a fever.  You are leaking fluid from your vagina.  You have spotting or bleeding from your vagina.  You have severe abdominal pain or cramping.  You have rapid weight loss or weight gain.  You have  shortness of breath with chest pain.  You notice sudden or extreme swelling of your face, hands, ankles, feet, or legs.  Your baby makes fewer than 10 movements in 2 hours.  You have severe headaches that do not go away with medicine.  You have vision changes. Summary  The third trimester is from week 29 through week 40, months 7 through 9. The third trimester is a time when the unborn baby (fetus)  is growing rapidly.  During the third trimester, your discomfort may increase as you and your baby continue to gain weight. You may have abdominal, leg, and back pain, sleeping problems, and an increased need to urinate.  During the third trimester your breasts will keep growing and they will continue to become tender. A yellow fluid (colostrum) may leak from your breasts. This is the first milk you are producing for your baby.  False labor is a condition in which you feel small, irregular tightenings of the muscles in the womb (contractions) that eventually go away. These are called Braxton Hicks contractions. Contractions may last for hours, days, or even weeks before true labor sets in.  Signs of labor can include: abdominal cramps; regular contractions that start at 10 minutes apart and become stronger and more frequent with time; watery or bloody mucus discharge that comes from the vagina; increased pelvic pressure and dull back pain; and leaking of amniotic fluid. This information is not intended to replace advice given to you by your health care provider. Make sure you discuss any questions you have with your health care provider. Document Released: 09/01/2001 Document Revised: 02/13/2016 Document Reviewed: 11/08/2012 Elsevier Interactive Patient Education  2017 [employer].   Breastfeeding Deciding to breastfeed is one of the best choices you can make for you and your baby. A change in hormones during pregnancy causes your breast tissue to grow and increases the number and size of your  milk ducts. These hormones also allow proteins, sugars, and fats from your blood supply to make breast milk in your milk-producing glands. Hormones prevent breast milk from being released before your baby is born as well as prompt milk flow after birth. Once breastfeeding has begun, thoughts of your baby, as well as his or her sucking or crying, can stimulate the release of milk from your milk-producing glands. Benefits of breastfeeding For Your Baby  Your first milk (colostrum) helps your baby's digestive system function better.  There are antibodies in your milk that help your baby fight off infections.  Your baby has a lower incidence of asthma, allergies, and sudden infant death syndrome.  The nutrients in breast milk are better for your baby than infant formulas and are designed uniquely for your baby's needs.  Breast milk improves your baby's brain development.  Your baby is less likely to develop other conditions, such as childhood obesity, asthma, or type 2 diabetes mellitus. For You  Breastfeeding helps to create a very special bond between you and your baby.  Breastfeeding is convenient. Breast milk is always available at the correct temperature and costs nothing.  Breastfeeding helps to burn calories and helps you lose the weight gained during pregnancy.  Breastfeeding makes your uterus contract to its prepregnancy size faster and slows bleeding (lochia) after you give birth.  Breastfeeding helps to lower your risk of developing type 2 diabetes mellitus, osteoporosis, and breast or ovarian cancer later in life. Signs that your baby is hungry Early Signs of Hunger  Increased alertness or activity.  Stretching.  Movement of the head from side to side.  Movement of the head and opening of the mouth when the corner of the mouth or cheek is stroked (rooting).  Increased sucking sounds, smacking lips, cooing, sighing, or squeaking.  Hand-to-mouth movements.  Increased  sucking of fingers or hands. Late Signs of Hunger  Fussing.  Intermittent crying. Extreme Signs of Hunger  Signs of extreme hunger will require calming and consoling before your baby will  be able to breastfeed successfully. Do not wait for the following signs of extreme hunger to occur before you initiate breastfeeding:  Restlessness.  A loud, strong cry.  Screaming. Breastfeeding basics  Breastfeeding Initiation  Find a comfortable place to sit or lie down, with your neck and back well supported.  Place a pillow or rolled up blanket under your baby to bring him or her to the level of your breast (if you are seated). Nursing pillows are specially designed to help support your arms and your baby while you breastfeed.  Make sure that your baby's abdomen is facing your abdomen.  Gently massage your breast. With your fingertips, massage from your chest wall toward your nipple in a circular motion. This encourages milk flow. You may need to continue this action during the feeding if your milk flows slowly.  Support your breast with 4 fingers underneath and your thumb above your nipple. Make sure your fingers are well away from your nipple and your baby's mouth.  Stroke your baby's lips gently with your finger or nipple.  When your baby's mouth is open wide enough, quickly bring your baby to your breast, placing your entire nipple and as much of the colored area around your nipple (areola) as possible into your baby's mouth.  More areola should be visible above your baby's upper lip than below the lower lip.  Your baby's tongue should be between his or her lower gum and your breast.  Ensure that your baby's mouth is correctly positioned around your nipple (latched). Your baby's lips should create a seal on your breast and be turned out (everted).  It is common for your baby to suck about 2-3 minutes in order to start the flow of breast milk. Latching  Teaching your baby how to latch  on to your breast properly is very important. An improper latch can cause nipple pain and decreased milk supply for you and poor weight gain in your baby. Also, if your baby is not latched onto your nipple properly, he or she may swallow some air during feeding. This can make your baby fussy. Burping your baby when you switch breasts during the feeding can help to get rid of the air. However, teaching your baby to latch on properly is still the best way to prevent fussiness from swallowing air while breastfeeding. Signs that your baby has successfully latched on to your nipple:  Silent tugging or silent sucking, without causing you pain.  Swallowing heard between every 3-4 sucks.  Muscle movement above and in front of his or her ears while sucking. Signs that your baby has not successfully latched on to nipple:  Sucking sounds or smacking sounds from your baby while breastfeeding.  Nipple pain. If you think your baby has not latched on correctly, slip your finger into the corner of your baby's mouth to break the suction and place it between your baby's gums. Attempt breastfeeding initiation again. Signs of Successful Breastfeeding  Signs from your baby:  A gradual decrease in the number of sucks or complete cessation of sucking.  Falling asleep.  Relaxation of his or her body.  Retention of a small amount of milk in his or her mouth.  Letting go of your breast by himself or herself. Signs from you:  Breasts that have increased in firmness, weight, and size 1-3 hours after feeding.  Breasts that are softer immediately after breastfeeding.  Increased milk volume, as well as a change in milk consistency and color by  the fifth day of breastfeeding.  Nipples that are not sore, cracked, or bleeding. Signs That Your [employer] is Getting Enough Milk  Wetting at least 1-2 diapers during the first 24 hours after birth.  Wetting at least 5-6 diapers every 24 hours for the first week after  birth. The urine should be clear or pale yellow by 5 days after birth.  Wetting 6-8 diapers every 24 hours as your baby continues to grow and develop.  At least 3 stools in a 24-hour period by age 18 days. The stool should be soft and yellow.  At least 3 stools in a 24-hour period by age 71 days. The stool should be seedy and yellow.  No loss of weight greater than 10% of birth weight during the first 29 days of age.  Average weight gain of 4-7 ounces (113-198 g) per week after age 19 days.  Consistent daily weight gain by age 37 days, without weight loss after the age of 2 weeks. After a feeding, your baby may spit up a small amount. This is common. Breastfeeding frequency and duration Frequent feeding will help you make more milk and can prevent sore nipples and breast engorgement. Breastfeed when you feel the need to reduce the fullness of your breasts or when your baby shows signs of hunger. This is called "breastfeeding on demand." Avoid introducing a pacifier to your baby while you are working to establish breastfeeding (the first 4-6 weeks after your baby is born). After this time you may choose to use a pacifier. Research has shown that pacifier use during the first year of a baby's life decreases the risk of sudden infant death syndrome (SIDS). Allow your baby to feed on each breast as long as he or she wants. Breastfeed until your baby is finished feeding. When your baby unlatches or falls asleep while feeding from the first breast, offer the second breast. Because newborns are often sleepy in the first few weeks of life, you may need to awaken your baby to get him or her to feed. Breastfeeding times will vary from baby to baby. However, the following rules can serve as a guide to help you ensure that your baby is properly fed:  Newborns (babies 43 weeks of age or younger) may breastfeed every 1-3 hours.  Newborns should not go longer than 3 hours during the day or 5 hours during the night  without breastfeeding.  You should breastfeed your baby a minimum of 8 times in a 24-hour period until you begin to introduce solid foods to your baby at around 48 months of age. Breast milk pumping Pumping and storing breast milk allows you to ensure that your baby is exclusively fed your breast milk, even at times when you are unable to breastfeed. This is especially important if you are going back to work while you are still breastfeeding or when you are not able to be present during feedings. Your lactation consultant can give you guidelines on how long it is safe to store breast milk. A breast pump is a machine that allows you to pump milk from your breast into a sterile bottle. The pumped breast milk can then be stored in a refrigerator or freezer. Some breast pumps are operated by hand, while others use electricity. Ask your lactation consultant which type will work best for you. Breast pumps can be purchased, but some hospitals and breastfeeding support groups lease breast pumps on a monthly basis. A lactation consultant can teach you how to  hand express breast milk, if you prefer not to use a pump. Caring for your breasts while you breastfeed Nipples can become dry, cracked, and sore while breastfeeding. The following recommendations can help keep your breasts moisturized and healthy:  Avoid using soap on your nipples.  Wear a supportive bra. Although not required, special nursing bras and tank tops are designed to allow access to your breasts for breastfeeding without taking off your entire bra or top. Avoid wearing underwire-style bras or extremely tight bras.  Air dry your nipples for 3-71mnutes after each feeding.  Use only cotton bra pads to absorb leaked breast milk. Leaking of breast milk between feedings is normal.  Use lanolin on your nipples after breastfeeding. Lanolin helps to maintain your skin's normal moisture barrier. If you use pure lanolin, you do not need to wash it off  before feeding your baby again. Pure lanolin is not toxic to your baby. You may also hand express a few drops of breast milk and gently massage that milk into your nipples and allow the milk to air dry. In the first few weeks after giving birth, some women experience extremely full breasts (engorgement). Engorgement can make your breasts feel heavy, warm, and tender to the touch. Engorgement peaks within 3-5 days after you give birth. The following recommendations can help ease engorgement:  Completely empty your breasts while breastfeeding or pumping. You may want to start by applying warm, moist heat (in the shower or with warm water-soaked hand towels) just before feeding or pumping. This increases circulation and helps the milk flow. If your baby does not completely empty your breasts while breastfeeding, pump any extra milk after he or she is finished.  Wear a snug bra (nursing or regular) or tank top for 1-2 days to signal your body to slightly decrease milk production.  Apply ice packs to your breasts, unless this is too uncomfortable for you.  Make sure that your baby is latched on and positioned properly while breastfeeding. If engorgement persists after 48 hours of following these recommendations, contact your health care provider or a lScience writer Overall health care recommendations while breastfeeding  Eat healthy foods. Alternate between meals and snacks, eating 3 of each per day. Because what you eat affects your breast milk, some of the foods may make your baby more irritable than usual. Avoid eating these foods if you are sure that they are negatively affecting your baby.  Drink milk, fruit juice, and water to satisfy your thirst (about 10 glasses a day).  Rest often, relax, and continue to take your prenatal vitamins to prevent fatigue, stress, and anemia.  Continue breast self-awareness checks.  Avoid chewing and smoking tobacco. Chemicals from cigarettes that pass  into breast milk and exposure to secondhand smoke may harm your baby.  Avoid alcohol and drug use, including marijuana. Some medicines that may be harmful to your baby can pass through breast milk. It is important to ask your health care provider before taking any medicine, including all over-the-counter and prescription medicine as well as vitamin and herbal supplements. It is possible to become pregnant while breastfeeding. If birth control is desired, ask your health care provider about options that will be safe for your baby. Contact a health care provider if:  You feel like you want to stop breastfeeding or have become frustrated with breastfeeding.  You have painful breasts or nipples.  Your nipples are cracked or bleeding.  Your breasts are red, tender, or warm.  You have  a swollen area on either breast.  You have a fever or chills.  You have nausea or vomiting.  You have drainage other than breast milk from your nipples.  Your breasts do not become full before feedings by the fifth day after you give birth.  You feel sad and depressed.  Your baby is too sleepy to eat well.  Your baby is having trouble sleeping.  Your baby is wetting less than 3 diapers in a 24-hour period.  Your baby has less than 3 stools in a 24-hour period.  Your baby's skin or the white part of his or her eyes becomes yellow.  Your baby is not gaining weight by 27 days of age. Get help right away if:  Your baby is overly tired (lethargic) and does not want to wake up and feed.  Your baby develops an unexplained fever. This information is not intended to replace advice given to you by your health care provider. Make sure you discuss any questions you have with your health care provider. Document Released: 09/07/2005 Document Revised: 02/19/2016 Document Reviewed: 03/01/2013 Elsevier Interactive Patient Education  2017 [employer].

## 2016-11-06 ENCOUNTER — Encounter (HOSPITAL_COMMUNITY): Payer: Self-pay | Admitting: *Deleted

## 2016-11-06 ENCOUNTER — Inpatient Hospital Stay (HOSPITAL_COMMUNITY)
Admission: AD | Admit: 2016-11-06 | Discharge: 2016-11-06 | Disposition: A | Discharge status: Home / Self Care | Payer: BC Managed Care – PPO | Source: Ambulatory Visit | Attending: Family Medicine | Admitting: Family Medicine

## 2016-11-06 DIAGNOSIS — N898 Other specified noninflammatory disorders of vagina: Secondary | ICD-10-CM | POA: Insufficient documentation

## 2016-11-06 DIAGNOSIS — F17290 Nicotine dependence, other tobacco product, uncomplicated: Secondary | ICD-10-CM | POA: Insufficient documentation

## 2016-11-06 DIAGNOSIS — O99333 Smoking (tobacco) complicating pregnancy, third trimester: Secondary | ICD-10-CM | POA: Diagnosis not present

## 2016-11-06 DIAGNOSIS — O26893 Other specified pregnancy related conditions, third trimester: Secondary | ICD-10-CM | POA: Insufficient documentation

## 2016-11-06 DIAGNOSIS — Z3A38 38 weeks gestation of pregnancy: Secondary | ICD-10-CM | POA: Diagnosis not present

## 2016-11-06 DIAGNOSIS — O4202 Full-term premature rupture of membranes, onset of labor within 24 hours of rupture: Secondary | ICD-10-CM | POA: Diagnosis not present

## 2016-11-06 HISTORY — DX: Unspecified infectious disease: B99.9

## 2016-11-06 LAB — POCT FERN TEST: POCT FERN TEST: NEGATIVE

## 2016-11-06 NOTE — Discharge Instructions (Signed)
Braxton Hicks Contractions Contractions of the uterus can occur throughout pregnancy. Contractions are not always a sign that you are in labor.  WHAT ARE BRAXTON HICKS CONTRACTIONS?  Contractions that occur before labor are called Braxton Hicks contractions, or false labor. Toward the end of pregnancy (32-34 weeks), these contractions can develop more often and may become more forceful. This is not true labor because these contractions do not result in opening (dilatation) and thinning of the cervix. They are sometimes difficult to tell apart from true labor because these contractions can be forceful and people have different pain tolerances. You should not feel embarrassed if you go to the hospital with false labor. Sometimes, the only way to tell if you are in true labor is for your health care provider to look for changes in the cervix. If there are no prenatal problems or other health problems associated with the pregnancy, it is completely safe to be sent home with false labor and await the onset of true labor. HOW CAN YOU TELL THE DIFFERENCE BETWEEN TRUE AND FALSE LABOR? False Labor   The contractions of false labor are usually shorter and not as hard as those of true labor.   The contractions are usually irregular.   The contractions are often felt in the front of the lower abdomen and in the groin.   The contractions may go away when you walk around or change positions while lying down.   The contractions get weaker and are shorter lasting as time goes on.   The contractions do not usually become progressively stronger, regular, and closer together as with true labor.  True Labor   Contractions in true labor last 30-70 seconds, become very regular, usually become more intense, and increase in frequency.   The contractions do not go away with walking.   The discomfort is usually felt in the top of the uterus and spreads to the lower abdomen and low back.   True labor can be  determined by your health care provider with an exam. This will show that the cervix is dilating and getting thinner.  WHAT TO REMEMBER  Keep up with your usual exercises and follow other instructions given by your health care provider.   Take medicines as directed by your health care provider.   Keep your regular prenatal appointments.   Eat and drink lightly if you think you are going into labor.   If Braxton Hicks contractions are making you uncomfortable:   Change your position from lying down or resting to walking, or from walking to resting.   Sit and rest in a tub of warm water.   Drink 2-3 glasses of water. Dehydration may cause these contractions.   Do slow and deep breathing several times an hour.  WHEN SHOULD I SEEK IMMEDIATE MEDICAL CARE? Seek immediate medical care if:  Your contractions become stronger, more regular, and closer together.   You have fluid leaking or gushing from your vagina.   You have a fever.   You pass blood-tinged mucus.   You have vaginal bleeding.   You have continuous abdominal pain.   You have low back pain that you never had before.   You feel your baby's head pushing down and causing pelvic pressure.   Your baby is not moving as much as it used to.  This information is not intended to replace advice given to you by your health care provider. Make sure you discuss any questions you have with your health care  provider. Document Released: 09/07/2005 Document Revised: 12/30/2015 Document Reviewed: 06/19/2013 Elsevier Interactive Patient Education  2017 [employer].

## 2016-11-06 NOTE — MAU Provider Note (Signed)
  History     CSN: [id number]  Arrival date and time: 11/06/16 8366   First Provider Initiated Contact with Patient 11/06/16 1019      Chief Complaint  Patient presents with  . Rupture of Membranes   HPI Deanna [name] is a 21yo G2P0010 @ 38.1wks who presents for eval of leaking fluid x 1 last night. None this morning. Reports mild irreg cramping as well. No bldg. No N/V, fever or dysuria. Her preg has been followed by the [place] office and has been essentially unremarkable pregnancy-wise- med hx below.  OB History    Gravida Para Term Preterm AB Living   2 0 0 0 1 0   SAB TAB Ectopic Multiple Live Births   0 1 0 0 0      Past Medical History:  Diagnosis Date  . 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 Crohn disease ([place])   . Infection    UTI  . Inflammatory bowel disease    chrohn's disease    Past Surgical History:  Procedure Laterality Date  . NO PAST SURGERIES      Family History  Problem Relation Age of Onset  . Diabetes Maternal Grandmother   . Cancer Maternal Aunt     stomach  . Diabetes Maternal Grandfather   . Heart disease Maternal Grandfather   . Cancer Maternal Grandfather     colon  . Stroke Maternal Grandfather     Social History  Substance Use Topics  . Smoking status: Current Every Day Smoker    Types: E-cigarettes  . Smokeless tobacco: Never Used     Comment: vaping in high school  . Alcohol use No    Allergies: No Known Allergies  Prescriptions Prior to Admission  Medication Sig Dispense Refill Last Dose  . acetaminophen (TYLENOL) 500 MG tablet Take 1,000 mg by mouth every 6 (six) hours as needed for mild pain, moderate pain or headache.   Past Week at Unknown time  . cetirizine (ZYRTEC) 10 MG tablet Take 10 mg by mouth daily as needed for allergies.   Past Month at Unknown time  . promethazine (PHENERGAN) 25 MG tablet Take 1 tablet (25 mg total) by mouth every 6 (six) hours as needed for nausea or vomiting. 30 tablet 0 11/05/2016 at Unknown  time    Review of Systems No pertinents other than what is listed in HPI Physical Exam   Blood pressure 119/69, pulse 97, temperature 98 F (36.7 C), temperature source Oral, resp. rate 16, last menstrual period 02/16/2016.  Physical Exam  Constitutional: She is oriented to person, place, and time. She appears well-developed.  HENT:  Head: Normocephalic.  Neck: Normal range of motion.  Cardiovascular: Normal rate.   Respiratory: Effort normal.  Genitourinary:  Genitourinary Comments: SSE: white vag d/c, no pooling, neg fern Cx 1/thick/post  Musculoskeletal: Normal range of motion.  Neurological: She is alert and oriented to person, place, and time.  Skin: Skin is warm and dry.  Psychiatric: She has a normal mood and affect. Her behavior is normal. Thought content normal.    MAU Course  Procedures  MDM SSE  Assessment and Plan  IUP@38 .1wks Vag d/c in preg  D/C home with labor/ROM/bldg precautions F/U as scheduled at next OB visit  Serita Grammes CNM 11/06/2016, 10:28 AM

## 2016-11-06 NOTE — MAU Note (Signed)
Pt states she noted some fluid leaking last night while lying down, unsure if she is leaking this morning.  Fluid was clear.  Is having some cramping, denies bleeding.

## 2016-11-06 NOTE — MAU Note (Signed)
?   Cramping; ctx vs braxton hicks.  Yesterday after left dr, noted ? Leaking during the day and some last night. None today. No bleeding

## 2016-11-16 ENCOUNTER — Other Ambulatory Visit (HOSPITAL_COMMUNITY)
Admission: RE | Admit: 2016-11-16 | Discharge: 2016-11-16 | Disposition: A | Discharge status: Home / Self Care | Payer: BC Managed Care – PPO | Source: Ambulatory Visit | Attending: Obstetrics and Gynecology | Admitting: Obstetrics and Gynecology

## 2016-11-16 ENCOUNTER — Ambulatory Visit (INDEPENDENT_AMBULATORY_CARE_PROVIDER_SITE_OTHER): Payer: BC Managed Care – PPO | Admitting: Obstetrics and Gynecology

## 2016-11-16 VITALS — BP 120/60 | HR 103 | Wt 252.1 lb

## 2016-11-16 DIAGNOSIS — Z3483 Encounter for supervision of other normal pregnancy, third trimester: Secondary | ICD-10-CM | POA: Diagnosis not present

## 2016-11-16 DIAGNOSIS — Z113 Encounter for screening for infections with a predominantly sexual mode of transmission: Secondary | ICD-10-CM | POA: Diagnosis present

## 2016-11-16 LAB — POCT URINALYSIS DIP (DEVICE)
BILIRUBIN URINE: NEGATIVE
Glucose, UA: NEGATIVE mg/dL
Hgb urine dipstick: NEGATIVE
Ketones, ur: NEGATIVE mg/dL
NITRITE: NEGATIVE
PH: 6.5 (ref 5.0–8.0)
PROTEIN: NEGATIVE mg/dL
Specific Gravity, Urine: 1.015 (ref 1.005–1.030)
Urobilinogen, UA: 1 mg/dL (ref 0.0–1.0)

## 2016-11-16 NOTE — Progress Notes (Signed)
   PRENATAL VISIT NOTE  Subjective:  Deanna [name] is a 21 y.o. G2P0010 at 44w4dbeing seen today for ongoing prenatal care.  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  Patient reports no complaints.  Contractions: Irregular. Vag. Bleeding: None.  Movement: Present. Denies leaking of fluid.   The following portions of the patient's history were reviewed and updated as appropriate: allergies, current medications, past family history, past medical history, past social history, past surgical history and problem list. Problem list updated.  Objective:   Vitals:   11/16/16 1247  BP: 120/60  Pulse: (!) 103  Weight: 252 lb 1.6 oz (114.4 kg)    Fetal Status: Fetal Heart Rate (bpm): 136   Movement: Present     General:  Alert, oriented and cooperative. Patient is in no acute distress.  Skin: Skin is warm and dry. No rash noted.   Cardiovascular: Normal heart rate noted  Respiratory: Normal respiratory effort, no problems with respiration noted  Abdomen: Soft, gravid, appropriate for gestational age. Pain/Pressure: Present     Pelvic:  Cervical exam deferred        Extremities: Normal range of motion.  Edema: Mild pitting, slight indentation  Mental Status: Normal mood and affect. Normal behavior. Normal judgment and thought content.   Assessment and Plan:  Pregnancy: G2P0010 at 380w4d1. Encounter for supervision of other normal pregnancy in third trimester -labor precautions given - GC/Chlamydia probe amp ([place])not at [facility] Culture, beta strep (group b only) - induction set for 41 weeks.   Term labor symptoms and general obstetric precautions including but not limited to vaginal bleeding,  contractions, leaking of fluid and fetal movement were reviewed in detail with the patient. Please refer to After Visit Summary for other counseling recommendations.  Return in about 1 week (around 11/23/2016).   NiWaldemar DickensMD

## 2016-11-16 NOTE — Progress Notes (Signed)
Still has morning sickness sometimes.

## 2016-11-16 NOTE — Patient Instructions (Signed)
Labor Induction Labor induction is when steps are taken to cause a pregnant woman to begin the labor process. Most women go into labor on their own between 37 weeks and 42 weeks of the pregnancy. When this does not happen or when there is a medical need, methods may be used to induce labor. Labor induction causes a pregnant woman's uterus to contract. It also causes the cervix to soften (ripen), open (dilate), and thin out (efface). Usually, labor is not induced before 39 weeks of the pregnancy unless there is a problem with the baby or mother. Before inducing labor, your health care provider will consider a number of factors, including the following:  The medical condition of you and the baby.  How many weeks along you are.  The status of the baby's lung maturity.  The condition of the cervix.  The position of the baby. What are the reasons for labor induction? Labor may be induced for the following reasons:  The health of the baby or mother is at risk.  The pregnancy is overdue by 1 week or more.  The water breaks but labor does not start on its own.  The mother has a health condition or serious illness, such as high blood pressure, infection, placental abruption, or diabetes.  The amniotic fluid amounts are low around the baby.  The baby is distressed. Convenience or wanting the baby to be born on a certain date is not a reason for inducing labor. What methods are used for labor induction? Several methods of labor induction may be used, such as:  Prostaglandin medicine. This medicine causes the cervix to dilate and ripen. The medicine will also start contractions. It can be taken by mouth or by inserting a suppository into the vagina.  Inserting a thin tube (catheter) with a balloon on the end into the vagina to dilate the cervix. Once inserted, the balloon is expanded with water, which causes the cervix to open.  Stripping the membranes. Your health care provider separates  amniotic sac tissue from the cervix, causing the cervix to be stretched and causing the release of a hormone called progesterone. This may cause the uterus to contract. It is often done during an office visit. You will be sent home to wait for the contractions to begin. You will then come in for an induction.  Breaking the water. Your health care provider makes a hole in the amniotic sac using a small instrument. Once the amniotic sac breaks, contractions should begin. This may still take hours to see an effect.  Medicine to trigger or strengthen contractions. This medicine is given through an IV access tube inserted into a vein in your arm. All of the methods of induction, besides stripping the membranes, will be done in the hospital. Induction is done in the hospital so that you and the baby can be carefully monitored. How long does it take for labor to be induced? Some inductions can take up to 2-3 days. Depending on the cervix, it usually takes less time. It takes longer when you are induced early in the pregnancy or if this is your first pregnancy. If a mother is still pregnant and the induction has been going on for 2-3 days, either the mother will be sent home or a cesarean delivery will be needed. What are the risks associated with labor induction? Some of the risks of induction include:  Changes in fetal heart rate, such as too high, too low, or erratic.  Fetal distress.    Chance of infection for the mother and baby.  Increased chance of having a cesarean delivery.  Breaking off (abruption) of the placenta from the uterus (rare).  Uterine rupture (very rare). When induction is needed for medical reasons, the benefits of induction may outweigh the risks. What are some reasons for not inducing labor? Labor induction should not be done if:  It is shown that your baby does not tolerate labor.  You have had previous surgeries on your uterus, such as a myomectomy or the removal of  fibroids.  Your placenta lies very low in the uterus and blocks the opening of the cervix (placenta previa).  Your baby is not in a head-down position.  The umbilical cord drops down into the birth canal in front of the baby. This could cut off the baby's blood and oxygen supply.  You have had a previous cesarean delivery.  There are unusual circumstances, such as the baby being extremely premature. This information is not intended to replace advice given to you by your health care provider. Make sure you discuss any questions you have with your health care provider. Document Released: 01/27/2007 Document Revised: 02/13/2016 Document Reviewed: 04/06/2013 Elsevier Interactive Patient Education  2017 [employer] Inc[employer]  

## 2016-11-17 ENCOUNTER — Telehealth (HOSPITAL_COMMUNITY): Payer: Self-pay | Admitting: *Deleted

## 2016-11-17 LAB — GC/CHLAMYDIA PROBE AMP (~~LOC~~) NOT AT ARMC
Chlamydia: NEGATIVE
Neisseria Gonorrhea: NEGATIVE

## 2016-11-17 NOTE — Telephone Encounter (Signed)
Preadmission screen  

## 2016-11-18 ENCOUNTER — Inpatient Hospital Stay (HOSPITAL_COMMUNITY)
Admission: AD | Admit: 2016-11-18 | Discharge: 2016-11-18 | Disposition: A | Discharge status: Home / Self Care | Payer: BC Managed Care – PPO | Source: Ambulatory Visit | Attending: Obstetrics & Gynecology | Admitting: Obstetrics & Gynecology

## 2016-11-18 ENCOUNTER — Encounter (HOSPITAL_COMMUNITY): Payer: Self-pay

## 2016-11-18 ENCOUNTER — Inpatient Hospital Stay (HOSPITAL_COMMUNITY)
Admission: AD | Admit: 2016-11-18 | Discharge: 2016-11-18 | Disposition: A | Discharge status: Home / Self Care | Payer: BC Managed Care – PPO | Source: Ambulatory Visit | Attending: Family Medicine | Admitting: Family Medicine

## 2016-11-18 DIAGNOSIS — O471 False labor at or after 37 completed weeks of gestation: Secondary | ICD-10-CM

## 2016-11-18 DIAGNOSIS — O479 False labor, unspecified: Secondary | ICD-10-CM

## 2016-11-18 NOTE — Progress Notes (Signed)
I have communicated with Dr. Yisroel Ramming and reviewed vital signs:  Vitals:   11/18/16 1112  BP: 121/74  Pulse: 104  Resp: 16  Temp: 98.1 F (36.7 C)    Vaginal exam:  Dilation: 1.5 Effacement (%): 50 Cervical Position: Middle Station: -3 Presentation: Vertex Exam by:: Merlyn Albert RN ,   Also reviewed contraction pattern and that non-stress test is reactive.  It has been documented that patient is contracting every 3-5 minutes with no cervical change over 1 hours not indicating active labor.  Patient denies any other complaints.  Based on this report provider has given order for discharge.  A discharge diagnosis will be entered by a provider.  Labor discharge instructions reviewed with patient.

## 2016-11-18 NOTE — Discharge Instructions (Signed)
Fetal Movement Counts Patient Name: ________________________________________________ Patient Due Date: ____________________ What is a fetal movement count? A fetal movement count is the number of times that you feel your baby move during a certain amount of time. This may also be called a fetal kick count. A fetal movement count is recommended for every pregnant woman. You may be asked to start counting fetal movements as early as week 28 of your pregnancy. Pay attention to when your baby is most active. You may notice your baby's sleep and wake cycles. You may also notice things that make your baby move more. You should do a fetal movement count:  When your baby is normally most active.  At the same time each day. A good time to count movements is while you are resting, after having something to eat and drink. How do I count fetal movements? 1. Find a quiet, comfortable area. Sit, or lie down on your side. 2. Write down the date, the start time and stop time, and the number of movements that you felt between those two times. Take this information with you to your health care visits. 3. For 2 hours, count kicks, flutters, swishes, rolls, and jabs. You should feel at least 10 movements during 2 hours. 4. You may stop counting after you have felt 10 movements. 5. If you do not feel 10 movements in 2 hours, have something to eat and drink. Then, keep resting and counting for 1 hour. If you feel at least 4 movements during that hour, you may stop counting. Contact a health care provider if:  You feel fewer than 4 movements in 2 hours.  Your baby is not moving like he or she usually does. Date: ____________ Start time: ____________ Stop time: ____________ Movements: ____________ Date: ____________ Start time: ____________ Stop time: ____________ Movements: ____________ Date: ____________ Start time: ____________ Stop time: ____________ Movements: ____________ Date: ____________ Start time:  ____________ Stop time: ____________ Movements: ____________ Date: ____________ Start time: ____________ Stop time: ____________ Movements: ____________ Date: ____________ Start time: ____________ Stop time: ____________ Movements: ____________ Date: ____________ Start time: ____________ Stop time: ____________ Movements: ____________ Date: ____________ Start time: ____________ Stop time: ____________ Movements: ____________ Date: ____________ Start time: ____________ Stop time: ____________ Movements: ____________ This information is not intended to replace advice given to you by your health care provider. Make sure you discuss any questions you have with your health care provider. Document Released: 10/07/2006 Document Revised: 05/06/2016 Document Reviewed: 10/17/2015 Elsevier Interactive Patient Education  2017 [employer] of the uterus can occur throughout pregnancy, but they are not always a sign that you are in labor. You may have practice contractions called Braxton Hicks contractions. These false labor contractions are sometimes confused with true labor. What are [place] contractions? Braxton Hicks contractions are tightening movements that occur in the muscles of the uterus before labor. Unlike true labor contractions, these contractions do not result in opening (dilation) and thinning of the cervix. Toward the end of pregnancy (32-34 weeks), Braxton Hicks contractions can happen more often and may become stronger. These contractions are sometimes difficult to tell apart from true labor because they can be very uncomfortable. You should not feel embarrassed if you go to the hospital with false labor. Sometimes, the only way to tell if you are in true labor is for your health care provider to look for changes in the cervix. The health care provider will do a physical exam and may monitor your contractions. If you  are not in true labor, the exam  should show that your cervix is not dilating and your water has not broken. If there are no prenatal problems or other health problems associated with your pregnancy, it is completely safe for you to be sent home with false labor. You may continue to have Braxton Hicks contractions until you go into true labor. How can I tell the difference between true labor and false labor?  Differences  False labor  Contractions last 30-70 seconds.: Contractions are usually shorter and not as strong as true labor contractions.  Contractions become very regular.: Contractions are usually irregular.  Discomfort is usually felt in the top of the uterus, and it spreads to the lower abdomen and low back.: Contractions are often felt in the front of the lower abdomen and in the groin.  Contractions do not go away with walking.: Contractions may go away when you walk around or change positions while lying down.  Contractions usually become more intense and increase in frequency.: Contractions get weaker and are shorter-lasting as time goes on.  The cervix dilates and gets thinner.: The cervix usually does not dilate or become thin. Follow these instructions at home:  Take over-the-counter and prescription medicines only as told by your health care provider.  Keep up with your usual exercises and follow other instructions from your health care provider.  Eat and drink lightly if you think you are going into labor.  If Braxton Hicks contractions are making you uncomfortable:  Change your position from lying down or resting to walking, or change from walking to resting.  Sit and rest in a tub of warm water.  Drink enough fluid to keep your urine clear or pale yellow. Dehydration may cause these contractions.  Do slow and deep breathing several times an hour.  Keep all follow-up prenatal visits as told by your health care provider. This is important. Contact a health care provider if:  You have a  fever.  You have continuous pain in your abdomen. Get help right away if:  Your contractions become stronger, more regular, and closer together.  You have fluid leaking or gushing from your vagina.  You pass blood-tinged mucus (bloody show).  You have bleeding from your vagina.  You have low back pain that you never had before.  You feel your babys head pushing down and causing pelvic pressure.  Your baby is not moving inside you as much as it used to. Summary  Contractions that occur before labor are called Braxton Hicks contractions, false labor, or practice contractions.  Braxton Hicks contractions are usually shorter, weaker, farther apart, and less regular than true labor contractions. True labor contractions usually become progressively stronger and regular and they become more frequent.  Manage discomfort from [facility] contractions by changing position, resting in a warm bath, drinking plenty of water, or practicing deep breathing. This information is not intended to replace advice given to you by your health care provider. Make sure you discuss any questions you have with your health care provider. Document Released: 09/07/2005 Document Revised: 07/27/2016 Document Reviewed: 07/27/2016 Elsevier Interactive Patient Education  2017 [employer].

## 2016-11-18 NOTE — MAU Note (Signed)
Pt presents to MAU with ctx. Reports ctx started last night and are now q 3-5 min. Denies bleeding or leaking of fluid. Reports good fetal movement.

## 2016-11-18 NOTE — MAU Note (Signed)
I have communicated with Derrill Memo CNM  and reviewed vital signs:  Vitals:   11/18/16 1901 11/18/16 2029  BP: 135/89 136/77  Pulse: 111 105  Resp: 18 18    Vaginal exam:  Dilation: 3 Effacement (%): 90 Cervical Position: Anterior Station: -1, -2 Presentation: Vertex Exam by:: Blair Hailey RN ,   Also reviewed contraction pattern and that non-stress test is reactive.  It has been documented that patient is contracting occasionally with no cervical change after ambulating for an hour not indicating active labor.  Patient denies any other complaints.  Based on this report provider has given order for discharge.  A discharge diagnosis will be entered by a provider.  Labor discharge instructions reviewed with patient.

## 2016-11-18 NOTE — MAU Note (Signed)
Patient presents with c/o ctx 3 mins apart. Patient denies any bleeding or LOF. Fetus active

## 2016-11-18 NOTE — Discharge Instructions (Signed)

## 2016-11-19 ENCOUNTER — Encounter (HOSPITAL_COMMUNITY): Payer: Self-pay

## 2016-11-19 ENCOUNTER — Inpatient Hospital Stay (HOSPITAL_COMMUNITY)
Admission: AD | Admit: 2016-11-19 | Discharge: 2016-11-21 | DRG: 775 | Disposition: A | Discharge status: Home / Self Care | Payer: BC Managed Care – PPO | Source: Ambulatory Visit | Attending: Obstetrics & Gynecology | Admitting: Obstetrics & Gynecology

## 2016-11-19 ENCOUNTER — Encounter (HOSPITAL_COMMUNITY): Payer: Self-pay | Admitting: *Deleted

## 2016-11-19 ENCOUNTER — Inpatient Hospital Stay (HOSPITAL_COMMUNITY)
Admission: AD | Admit: 2016-11-19 | Discharge: 2016-11-19 | Disposition: A | Discharge status: Home / Self Care | Payer: BC Managed Care – PPO | Source: Ambulatory Visit | Attending: Obstetrics & Gynecology | Admitting: Obstetrics & Gynecology

## 2016-11-19 ENCOUNTER — Encounter: Payer: Self-pay | Admitting: Family Medicine

## 2016-11-19 DIAGNOSIS — M0890 Juvenile arthritis, unspecified, unspecified site: Secondary | ICD-10-CM | POA: Diagnosis present

## 2016-11-19 DIAGNOSIS — Z833 Family history of diabetes mellitus: Secondary | ICD-10-CM | POA: Diagnosis not present

## 2016-11-19 DIAGNOSIS — O4202 Full-term premature rupture of membranes, onset of labor within 24 hours of rupture: Secondary | ICD-10-CM | POA: Diagnosis present

## 2016-11-19 DIAGNOSIS — Z823 Family history of stroke: Secondary | ICD-10-CM

## 2016-11-19 DIAGNOSIS — Z8249 Family history of ischemic heart disease and other diseases of the circulatory system: Secondary | ICD-10-CM | POA: Diagnosis not present

## 2016-11-19 DIAGNOSIS — O9962 Diseases of the digestive system complicating childbirth: Secondary | ICD-10-CM | POA: Diagnosis present

## 2016-11-19 DIAGNOSIS — Z3A40 40 weeks gestation of pregnancy: Secondary | ICD-10-CM

## 2016-11-19 DIAGNOSIS — K5090 Crohn's disease, unspecified, without complications: Secondary | ICD-10-CM | POA: Diagnosis present

## 2016-11-19 DIAGNOSIS — Z87891 Personal history of nicotine dependence: Secondary | ICD-10-CM | POA: Diagnosis not present

## 2016-11-19 DIAGNOSIS — Z3493 Encounter for supervision of normal pregnancy, unspecified, third trimester: Secondary | ICD-10-CM | POA: Diagnosis present

## 2016-11-19 DIAGNOSIS — O471 False labor at or after 37 completed weeks of gestation: Secondary | ICD-10-CM

## 2016-11-19 LAB — TYPE AND SCREEN
ABO/RH(D): O POS
ANTIBODY SCREEN: NEGATIVE

## 2016-11-19 LAB — RPR: RPR: NONREACTIVE

## 2016-11-19 LAB — CBC
HCT: 36.4 % (ref 36.0–46.0)
Hemoglobin: 12.7 g/dL (ref 12.0–15.0)
MCH: 28.9 pg (ref 26.0–34.0)
MCHC: 34.9 g/dL (ref 30.0–36.0)
MCV: 82.9 fL (ref 78.0–100.0)
PLATELETS: 181 10*3/uL (ref 150–400)
RBC: 4.39 MIL/uL (ref 3.87–5.11)
RDW: 14.4 % (ref 11.5–15.5)
WBC: 12 10*3/uL — AB (ref 4.0–10.5)

## 2016-11-19 LAB — GROUP B STREP BY PCR: Group B strep by PCR: INVALID — AB

## 2016-11-19 LAB — ABO/RH: ABO/RH(D): O POS

## 2016-11-19 MED ORDER — LIDOCAINE HCL (PF) 1 % IJ SOLN
INTRAMUSCULAR | Status: AC
  Filled 2016-11-19: qty 30

## 2016-11-19 MED ORDER — ACETAMINOPHEN 325 MG PO TABS
650.0000 mg | ORAL_TABLET | ORAL | Status: DC | PRN
  Administered 2016-11-21: 650 mg via ORAL
  Filled 2016-11-19: qty 2

## 2016-11-19 MED ORDER — LIDOCAINE HCL (PF) 1 % IJ SOLN
30.0000 mL | INTRAMUSCULAR | Status: DC | PRN
  Filled 2016-11-19: qty 30

## 2016-11-19 MED ORDER — ACETAMINOPHEN 325 MG PO TABS: 650.0000 mg | ORAL_TABLET | ORAL | Status: DC | PRN

## 2016-11-19 MED ORDER — OXYCODONE HCL 5 MG PO TABS
5.0000 mg | ORAL_TABLET | ORAL | Status: DC | PRN
  Administered 2016-11-20: 5 mg via ORAL
  Filled 2016-11-19: qty 1

## 2016-11-19 MED ORDER — IBUPROFEN 600 MG PO TABS
600.0000 mg | ORAL_TABLET | Freq: Four times a day (QID) | ORAL | Status: DC
  Administered 2016-11-19 – 2016-11-21 (×8): 600 mg via ORAL
  Filled 2016-11-19 (×9): qty 1

## 2016-11-19 MED ORDER — ONDANSETRON HCL 4 MG PO TABS: 4.0000 mg | ORAL_TABLET | ORAL | Status: DC | PRN

## 2016-11-19 MED ORDER — LACTATED RINGERS IV BOLUS (SEPSIS)
500.0000 mL | Freq: Once | INTRAVENOUS | Status: AC
  Administered 2016-11-19: 500 mL via INTRAVENOUS

## 2016-11-19 MED ORDER — ONDANSETRON HCL 4 MG/2ML IJ SOLN: 4.0000 mg | INTRAMUSCULAR | Status: DC | PRN

## 2016-11-19 MED ORDER — OXYTOCIN 40 UNITS IN LACTATED RINGERS INFUSION - SIMPLE MED
INTRAVENOUS | Status: AC
Start: 2016-11-19 — End: 2016-11-19
  Filled 2016-11-19: qty 1000

## 2016-11-19 MED ORDER — DIPHENHYDRAMINE HCL 25 MG PO CAPS: 25.0000 mg | ORAL_CAPSULE | Freq: Four times a day (QID) | ORAL | Status: DC | PRN

## 2016-11-19 MED ORDER — OXYTOCIN BOLUS FROM INFUSION
500.0000 mL | Freq: Once | INTRAVENOUS | Status: AC
  Administered 2016-11-19: 500 mL via INTRAVENOUS

## 2016-11-19 MED ORDER — ONDANSETRON HCL 4 MG/2ML IJ SOLN: 4.0000 mg | Freq: Four times a day (QID) | INTRAMUSCULAR | Status: DC | PRN

## 2016-11-19 MED ORDER — OXYCODONE-ACETAMINOPHEN 5-325 MG PO TABS
1.0000 | ORAL_TABLET | ORAL | Status: DC | PRN
  Administered 2016-11-19: 1 via ORAL
  Filled 2016-11-19: qty 1

## 2016-11-19 MED ORDER — WITCH HAZEL-GLYCERIN EX PADS
1.0000 "application " | MEDICATED_PAD | CUTANEOUS | Status: DC | PRN
  Administered 2016-11-21: 1 via TOPICAL

## 2016-11-19 MED ORDER — COCONUT OIL OIL: 1.0000 "application " | TOPICAL_OIL | Status: DC | PRN

## 2016-11-19 MED ORDER — SIMETHICONE 80 MG PO CHEW: 80.0000 mg | CHEWABLE_TABLET | ORAL | Status: DC | PRN

## 2016-11-19 MED ORDER — LACTATED RINGERS IV SOLN
INTRAVENOUS | Status: DC
  Administered 2016-11-19: 06:00:00 via INTRAVENOUS

## 2016-11-19 MED ORDER — PRENATAL MULTIVITAMIN CH
1.0000 | ORAL_TABLET | Freq: Every day | ORAL | Status: DC
  Administered 2016-11-20 – 2016-11-21 (×2): 1 via ORAL
  Filled 2016-11-19 (×3): qty 1

## 2016-11-19 MED ORDER — TETANUS-DIPHTH-ACELL PERTUSSIS 5-2.5-18.5 LF-MCG/0.5 IM SUSP: 0.5000 mL | Freq: Once | INTRAMUSCULAR | Status: DC

## 2016-11-19 MED ORDER — BENZOCAINE-MENTHOL 20-0.5 % EX AERO
1.0000 "application " | INHALATION_SPRAY | CUTANEOUS | Status: DC | PRN
  Filled 2016-11-19: qty 56

## 2016-11-19 MED ORDER — SOD CITRATE-CITRIC ACID 500-334 MG/5ML PO SOLN: 30.0000 mL | ORAL | Status: DC | PRN

## 2016-11-19 MED ORDER — OXYCODONE-ACETAMINOPHEN 5-325 MG PO TABS
1.0000 | ORAL_TABLET | Freq: Once | ORAL | Status: AC
  Administered 2016-11-19: 1 via ORAL
  Filled 2016-11-19: qty 1

## 2016-11-19 MED ORDER — OXYTOCIN 40 UNITS IN LACTATED RINGERS INFUSION - SIMPLE MED: 2.5000 [IU]/h | INTRAVENOUS | Status: DC

## 2016-11-19 MED ORDER — ZOLPIDEM TARTRATE 5 MG PO TABS
5.0000 mg | ORAL_TABLET | Freq: Every evening | ORAL | Status: DC | PRN
  Administered 2016-11-19: 5 mg via ORAL
  Filled 2016-11-19: qty 1

## 2016-11-19 MED ORDER — ZOLPIDEM TARTRATE 5 MG PO TABS: 5.0000 mg | ORAL_TABLET | Freq: Every evening | ORAL | Status: DC | PRN

## 2016-11-19 MED ORDER — FLEET ENEMA 7-19 GM/118ML RE ENEM: 1.0000 | ENEMA | RECTAL | Status: DC | PRN

## 2016-11-19 MED ORDER — SENNOSIDES-DOCUSATE SODIUM 8.6-50 MG PO TABS
2.0000 | ORAL_TABLET | ORAL | Status: DC
  Administered 2016-11-19 – 2016-11-21 (×2): 2 via ORAL
  Filled 2016-11-19 (×2): qty 2

## 2016-11-19 MED ORDER — LACTATED RINGERS IV SOLN: 500.0000 mL | INTRAVENOUS | Status: DC | PRN

## 2016-11-19 MED ORDER — OXYCODONE-ACETAMINOPHEN 5-325 MG PO TABS: 2.0000 | ORAL_TABLET | ORAL | Status: DC | PRN

## 2016-11-19 MED ORDER — DIBUCAINE 1 % RE OINT: 1.0000 "application " | TOPICAL_OINTMENT | RECTAL | Status: DC | PRN

## 2016-11-19 NOTE — MAU Note (Signed)
Recheck patient in 1 hour per CNM

## 2016-11-19 NOTE — MAU Note (Signed)
Pt just discharged from MAU, states water broke at 0545-clear. Contractions every 4 mins. +FM.

## 2016-11-19 NOTE — H&P (Deleted)
Deanna [name] is a 21 y.o. female G2P0010 @ 40.0wks presenting for leaking fluid since 17. She had recently been discharged from MAU for false labor. Denies bldg. Reports ctx increased w/ ROM. Her preg has been followed by the [place] office and has been remarkable for 1) Chrohn's dx 2) juvenile arthritis 3) GBS unknown  OB History    Gravida Para Term Preterm AB Living   2 0 0 0 1 0   SAB TAB Ectopic Multiple Live Births   0 1 0 0 0     Past Medical History:  Diagnosis Date  . 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 Crohn disease ([place])   . Infection    UTI  . Inflammatory bowel disease    chrohn's disease   Past Surgical History:  Procedure Laterality Date  . NO PAST SURGERIES     Family History: family history includes Cancer in her maternal aunt and maternal grandfather; Diabetes in her maternal grandfather and maternal grandmother; Heart disease in her maternal grandfather; Stroke in her maternal grandfather. Social History:  reports that she has quit smoking. Her smoking use included E-cigarettes. She has never used smokeless tobacco. She reports that she does not drink alcohol or use drugs.     Maternal Diabetes: No Genetic Screening: Normal Maternal Ultrasounds/Referrals: Normal Fetal Ultrasounds or other Referrals:  None Maternal Substance Abuse:  No Significant Maternal Medications:  None Significant Maternal Lab Results:  Lab values include: Other:  GBS culture and PCR: pending Other Comments:  None  ROS History Dilation: 4.5 Effacement (%): 100 Station: -2 Exam by:: Maryagnes Amos RN Blood pressure 156/97, pulse 84, temperature 97.9 F (36.6 C), temperature source Oral, resp. rate 20, last menstrual period 02/16/2016. Exam Physical Exam  Constitutional: She is oriented to person, place, and time. She appears well-developed.  HENT:  Head: Normocephalic.  Neck: Normal range of motion.  Cardiovascular: Normal rate.   Respiratory: Effort normal.   GI:  EFM 130s, +accels, some variables Ctx q 2-3 mins, spont  Genitourinary: Vagina normal.  Musculoskeletal: Normal range of motion.  Neurological: She is alert and oriented to person, place, and time.  Skin: Skin is warm and dry.  Psychiatric: She has a normal mood and affect. Her behavior is normal. Thought content normal.    Prenatal labs: ABO, Rh: --/--/O POS (08/29 0008) Antibody: NEG (08/15 1543) Rubella: 1.59 (08/15 1543) RPR: NON REAC (12/12 0930)  HBsAg: NEGATIVE (08/15 1543)  HIV: NONREACTIVE (12/12 0930)  GBS:   unknown  Assessment/Plan: IUP@40 .0wks SROM Active labor GBS unk  Ad[facility] to [facility] Expectant management GBS PCR pending Anticipate SVD   SHAW, KIMBERLY CNM 11/19/2016, 6:38 AM

## 2016-11-19 NOTE — Progress Notes (Signed)
Dr. Yisroel Ramming notified of patient's BP of 108/43. Bolus ordered.

## 2016-11-19 NOTE — H&P (Signed)
Deanna [name] is a 21 y.o. female G2P0010 @ 40.0wks presenting for leaking fluid since 8. She had recently been discharged from [facility] for false labor. Denies bldg. Reports ctx increased w/ ROM. Her preg has been followed by the [place] office and has been remarkable for 1) Chrohn's dx 2) juvenile arthritis 3) GBS unknown          OB History    Gravida Para Term Preterm AB Living   2 0 0 0 1 0   SAB TAB Ectopic Multiple Live Births   0 1 0 0 0         Past Medical History:  Diagnosis Date  . 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 Crohn disease ([place])   . Infection    UTI  . Inflammatory bowel disease    chrohn's disease        Past Surgical History:  Procedure Laterality Date  . NO PAST SURGERIES     Family History: family history includes Cancer in her maternal aunt and maternal grandfather; Diabetes in her maternal grandfather and maternal grandmother; Heart disease in her maternal grandfather; Stroke in her maternal grandfather. Social History:  reports that she has quit smoking. Her smoking use included E-cigarettes. She has never used smokeless tobacco. She reports that she does not drink alcohol or use drugs.     Maternal Diabetes: No Genetic Screening: Normal Maternal Ultrasounds/Referrals: Normal Fetal Ultrasounds or other Referrals:  None Maternal Substance Abuse:  No Significant Maternal Medications:  None Significant Maternal Lab Results:  Lab values include: Other:  GBS culture and PCR: pending Other Comments:  None  ROS History Dilation: 4.5 Effacement (%): 100 Station: -2 Exam by:: Maryagnes Amos RN Blood pressure 156/97, pulse 84, temperature 97.9 F (36.6 C), temperature source Oral, resp. rate 20, last menstrual period 02/16/2016. Exam Physical Exam  Constitutional: She is oriented to person, place, and time. She appears well-developed.  HENT:  Head: Normocephalic.  Neck: Normal range of motion.  Cardiovascular: Normal  rate.   Respiratory: Effort normal.  GI:  EFM 130s, +accels, some variables Ctx q 2-3 mins, spont  Genitourinary: Vagina normal.  Musculoskeletal: Normal range of motion.  Neurological: She is alert and oriented to person, place, and time.  Skin: Skin is warm and dry.  Psychiatric: She has a normal mood and affect. Her behavior is normal. Thought content normal.    Prenatal labs: ABO, Rh: --/--/O POS (08/29 0008) Antibody: NEG (08/15 1543) Rubella: 1.59 (08/15 1543) RPR: NON REAC (12/12 0930)  HBsAg: NEGATIVE (08/15 1543)  HIV: NONREACTIVE (12/12 0930)  GBS:   unknown  Assessment/Plan: IUP@40 .0wks SROM Active labor GBS unk  Admit to [facility] Expectant management GBS PCR pending Anticipate SVD              SHAW, KIMBERLY CNM 11/19/2016, 6:38 AM

## 2016-11-19 NOTE — MAU Note (Signed)
Pt states that she was discharged from here around 9pm. Contractions much worse more-every 3-4 mins. Denies LOF. Has some bloody mucous. +FM. Cervix was 3cm when she was discharged.

## 2016-11-19 NOTE — Lactation Note (Signed)
This note was copied from a baby's chart. Lactation Consultation Note  Patient Name: Deanna [name] [id number] Date: 11/19/2016 Reason for consult: Follow-up assessment Baby at 15 hr of life. RN called for lactation because baby is not latching. Baby is sleepy at this visit and not interested in eating. Discussed baby behavior, feeding frequency, baby belly size, voids, wt loss, breast changes, and nipple care. Mom will continue to do sts and offer the breast on demand 8+/24hr. Parents are aware of lactation services and support group.    Maternal Data    Feeding Feeding Type: Breast Fed Length of feed: 0 min  LATCH Score/Interventions Latch: Too sleepy or reluctant, no latch achieved, no sucking elicited.                    Lactation Tools Discussed/Used     Consult Status Consult Status: Follow-up Date: 11/20/16 Follow-up type: In-patient    Denzil Hughes 11/19/2016, 10:14 PM

## 2016-11-19 NOTE — Discharge Instructions (Signed)
Fetal Movement Counts Patient Name: ________________________________________________ Patient Due Date: ____________________ What is a fetal movement count? A fetal movement count is the number of times that you feel your baby move during a certain amount of time. This may also be called a fetal kick count. A fetal movement count is recommended for every pregnant woman. You may be asked to start counting fetal movements as early as week 28 of your pregnancy. Pay attention to when your baby is most active. You may notice your baby's sleep and wake cycles. You may also notice things that make your baby move more. You should do a fetal movement count:  When your baby is normally most active.  At the same time each day. A good time to count movements is while you are resting, after having something to eat and drink. How do I count fetal movements? 1. Find a quiet, comfortable area. Sit, or lie down on your side. 2. Write down the date, the start time and stop time, and the number of movements that you felt between those two times. Take this information with you to your health care visits. 3. For 2 hours, count kicks, flutters, swishes, rolls, and jabs. You should feel at least 10 movements during 2 hours. 4. You may stop counting after you have felt 10 movements. 5. If you do not feel 10 movements in 2 hours, have something to eat and drink. Then, keep resting and counting for 1 hour. If you feel at least 4 movements during that hour, you may stop counting. Contact a health care provider if:  You feel fewer than 4 movements in 2 hours.  Your baby is not moving like he or she usually does. Date: ____________ Start time: ____________ Stop time: ____________ Movements: ____________ Date: ____________ Start time: ____________ Stop time: ____________ Movements: ____________ Date: ____________ Start time: ____________ Stop time: ____________ Movements: ____________ Date: ____________ Start time:  ____________ Stop time: ____________ Movements: ____________ Date: ____________ Start time: ____________ Stop time: ____________ Movements: ____________ Date: ____________ Start time: ____________ Stop time: ____________ Movements: ____________ Date: ____________ Start time: ____________ Stop time: ____________ Movements: ____________ Date: ____________ Start time: ____________ Stop time: ____________ Movements: ____________ Date: ____________ Start time: ____________ Stop time: ____________ Movements: ____________ This information is not intended to replace advice given to you by your health care provider. Make sure you discuss any questions you have with your health care provider. Document Released: 10/07/2006 Document Revised: 05/06/2016 Document Reviewed: 10/17/2015 Elsevier Interactive Patient Education  2017 [employer] of the uterus can occur throughout pregnancy, but they are not always a sign that you are in labor. You may have practice contractions called Braxton Hicks contractions. These false labor contractions are sometimes confused with true labor. What are [place] contractions? Braxton Hicks contractions are tightening movements that occur in the muscles of the uterus before labor. Unlike true labor contractions, these contractions do not result in opening (dilation) and thinning of the cervix. Toward the end of pregnancy (32-34 weeks), Braxton Hicks contractions can happen more often and may become stronger. These contractions are sometimes difficult to tell apart from true labor because they can be very uncomfortable. You should not feel embarrassed if you go to the hospital with false labor. Sometimes, the only way to tell if you are in true labor is for your health care provider to look for changes in the cervix. The health care provider will do a physical exam and may monitor your contractions. If you  are not in true labor, the exam  should show that your cervix is not dilating and your water has not broken. If there are no prenatal problems or other health problems associated with your pregnancy, it is completely safe for you to be sent home with false labor. You may continue to have Braxton Hicks contractions until you go into true labor. How can I tell the difference between true labor and false labor?  Differences  False labor  Contractions last 30-70 seconds.: Contractions are usually shorter and not as strong as true labor contractions.  Contractions become very regular.: Contractions are usually irregular.  Discomfort is usually felt in the top of the uterus, and it spreads to the lower abdomen and low back.: Contractions are often felt in the front of the lower abdomen and in the groin.  Contractions do not go away with walking.: Contractions may go away when you walk around or change positions while lying down.  Contractions usually become more intense and increase in frequency.: Contractions get weaker and are shorter-lasting as time goes on.  The cervix dilates and gets thinner.: The cervix usually does not dilate or become thin. Follow these instructions at home:  Take over-the-counter and prescription medicines only as told by your health care provider.  Keep up with your usual exercises and follow other instructions from your health care provider.  Eat and drink lightly if you think you are going into labor.  If Braxton Hicks contractions are making you uncomfortable:  Change your position from lying down or resting to walking, or change from walking to resting.  Sit and rest in a tub of warm water.  Drink enough fluid to keep your urine clear or pale yellow. Dehydration may cause these contractions.  Do slow and deep breathing several times an hour.  Keep all follow-up prenatal visits as told by your health care provider. This is important. Contact a health care provider if:  You have a  fever.  You have continuous pain in your abdomen. Get help right away if:  Your contractions become stronger, more regular, and closer together.  You have fluid leaking or gushing from your vagina.  You pass blood-tinged mucus (bloody show).  You have bleeding from your vagina.  You have low back pain that you never had before.  You feel your babys head pushing down and causing pelvic pressure.  Your baby is not moving inside you as much as it used to. Summary  Contractions that occur before labor are called Braxton Hicks contractions, false labor, or practice contractions.  Braxton Hicks contractions are usually shorter, weaker, farther apart, and less regular than true labor contractions. True labor contractions usually become progressively stronger and regular and they become more frequent.  Manage discomfort from [facility] contractions by changing position, resting in a warm bath, drinking plenty of water, or practicing deep breathing. This information is not intended to replace advice given to you by your health care provider. Make sure you discuss any questions you have with your health care provider. Document Released: 09/07/2005 Document Revised: 07/27/2016 Document Reviewed: 07/27/2016 Elsevier Interactive Patient Education  2017 [employer].

## 2016-11-19 NOTE — Progress Notes (Signed)
Dr. Yisroel Ramming notifed of Patient's BP of 112/44 pulse 83. Recheck BP in 10 minutes if still low call MD back.

## 2016-11-19 NOTE — Lactation Note (Signed)
This note was copied from a baby's chart. Lactation Consultation Note  Patient Name: Boy Kambre Messner [id number] Date: 11/19/2016   Attempted to consult with mom, mom asleep. Visitor in the room reports infant has not latched and was given a bottle of 10 cc formula. RN Candyce Churn reports infant biting at breast and has a good suckle on a finger. Will follow up at a later time. [place] handouts left at bedside in Room.     Maternal Data    Feeding Feeding Type: Formula  LATCH Score/Interventions Latch: Repeated attempts needed to sustain latch, nipple held in mouth throughout feeding, stimulation needed to elicit sucking reflex. Intervention(s): Skin to skin;Teach feeding cues;Waking techniques Intervention(s): Adjust position;Assist with latch;Breast massage;Breast compression  Audible Swallowing: A few with stimulation Intervention(s): Skin to skin;Hand expression  Type of Nipple: Flat (colostrum expressed)  Comfort (Breast/Nipple): Soft / non-tender     Hold (Positioning): Assistance needed to correctly position infant at breast and maintain latch. Intervention(s): Breastfeeding basics reviewed;Support Pillows;Position options;Skin to skin  LATCH Score: 6  Lactation Tools Discussed/Used     Consult Status      Donn Pierini 11/19/2016, 2:01 PM

## 2016-11-19 NOTE — Lactation Note (Signed)
This note was copied from a baby's chart. Lactation Consultation Note  Patient Name: Deanna [name] [id number] Date: 11/19/2016 Reason for consult: Initial assessment  Baby 65 hours old. Mom has room full of visitors and patient's are holding and passing baby around. Mom reports that the baby has nursed several times, but is sleepy now. Discussed newborn behavior, and enc offering lots of STS. Mom given [facility] brochure, aware of OP/BFSG and [place] phone line assistance after D/C. Enc mom to call out for assistance at next latch.   Maternal Data Has patient been taught Hand Expression?: Yes (Per mom.) Does the patient have breastfeeding experience prior to this delivery?: No  Feeding    LATCH Score/Interventions                      Lactation Tools Discussed/Used     Consult Status Consult Status: Follow-up Date: 11/20/16 Follow-up type: In-patient    Andres Labrum 11/19/2016, 3:44 PM

## 2016-11-19 NOTE — Lactation Note (Signed)
This note was copied from a baby's chart. Lactation Consultation Note  Patient Name: Deanna [name] [id number] Date: 11/19/2016 Reason for consult: Follow-up assessment  Baby 5 hours old. Assisted mom to latch baby in cross-cradle position on right breast and baby willing to latch initially and suckle 3-4 times, but then baby fell asleep. Enc mom to keep baby STS and nurse with cues.    Maternal Data Has patient been taught Hand Expression?: Yes (Per mom.) Does the patient have breastfeeding experience prior to this delivery?: No  Feeding Feeding Type: Breast Fed Length of feed: 0 min  LATCH Score/Interventions Latch: Too sleepy or reluctant, no latch achieved, no sucking elicited. Intervention(s): Skin to skin;Waking techniques Intervention(s): Adjust position;Assist with latch;Breast compression  Audible Swallowing: None Intervention(s): Hand expression Intervention(s): Skin to skin;Hand expression  Type of Nipple: Everted at rest and after stimulation  Comfort (Breast/Nipple): Soft / non-tender     Hold (Positioning): Assistance needed to correctly position infant at breast and maintain latch. Intervention(s): Breastfeeding basics reviewed;Support Pillows;Position options;Skin to skin  LATCH Score: 5  Lactation Tools Discussed/Used     Consult Status Consult Status: Follow-up Date: 11/20/16 Follow-up type: In-patient    Andres Labrum 11/19/2016, 3:58 PM

## 2016-11-19 NOTE — Progress Notes (Signed)
Call bell and emergency button explained. Patient is already ambulatory. Attempted to breast feed but infant would not open wide. Hand expression taught.

## 2016-11-20 LAB — CULTURE, BETA STREP (GROUP B ONLY): Strep Gp B Culture: NEGATIVE

## 2016-11-20 NOTE — Lactation Note (Signed)
This note was copied from a baby's chart. Lactation Consultation Note Follow up visit at 35 hours of age.  Mom reports recent bottle feeding of 76ms and mom has used personal NUK single electric pump and gave 128mof EBM.  Mom has easily expressed colostrum.  LC assisted with spoon feeding baby, baby very fussy and does not extend tongue past lower gumline.  LC noted tight anterior lingual frenulum.  Baby does not suck well when stimulated with gloved finger.  LC attempted latching and baby will suck a few times and stop.  Several drops expressed easily to baby's mouth, baby will swallow, but when latched baby doesn't maintain suck. MOm reports she gave some formula last night, but she really doesn't want baby to have formula.    LC fit mom with #24 NS, mom returned demonstration of application with good fit.  LC assisted with latching baby, with much assist baby latched with wide gape on #24 NS with intermittent sucking for 12 minutes.  Colostrum noted in NS and few swallows audible during feeding.  Mom denies pain with latching.    [place] up DEBP and encouraged mom to post pump and offer EBM after feedings.  Mom to pump for 15 minutes on initiate phase and then hand express after.  LC discussed using bottle nipple for suck training.  LC instructed mom on set up of DEBP, cleaning and milk storage. LC reported to Rn.  Mom to call for assist as needed.     Patient Name: Deanna [name]: 11/20/2016 Reason for consult: Follow-up assessment;Difficult latch   Maternal Data Has patient been taught Hand Expression?: Yes  Feeding Feeding Type: Breast Fed Length of feed: 12 min  LATCH Score/Interventions Latch: Repeated attempts needed to sustain latch, nipple held in mouth throughout feeding, stimulation needed to elicit sucking reflex. Intervention(s): Skin to skin;Teach feeding cues;Waking techniques Intervention(s): Breast massage;Breast compression  Audible Swallowing: A few with  stimulation Intervention(s): Skin to skin;Hand expression  Type of Nipple: Everted at rest and after stimulation  Comfort (Breast/Nipple): Soft / non-tender     Hold (Positioning): Full assist, staff holds infant at breast Intervention(s): Breastfeeding basics reviewed;Support Pillows;Position options;Skin to skin  LATCH Score: 6  Lactation Tools Discussed/Used Tools: Nipple Shields Nipple shield size: 24 WIC Program: Yes Pump Review: Setup, frequency, and cleaning;Milk Storage Initiated by:: JS IBCLC Date initiated:: 11/20/16   Consult Status Consult Status: Follow-up Date: 11/21/16 Follow-up type: In-patient    Deanna [name], Deanna [name]/10/2016, 9:05 PM

## 2016-11-20 NOTE — Clinical Social Work Maternal (Signed)
CLINICAL SOCIAL WORK MATERNAL/CHILD NOTE  Patient Details  Name: Deanna [name] MRN: [id number] Date of Birth: 05/29/1996  Date:  11/20/2016  Clinical Social Worker Initiating Note:  Evangelynn Lochridge Boyd-Gilyard Date/ Time Initiated:  11/20/16/1222     Child's Name:  Deanna Pulley Jr.  (FOb is Deanna Pulley Sr. 02/25/1994)   Legal Guardian:  Deanna [name] (FOB is Deanna Pulley Sr. 02/25/1994)   Need for Interpreter:  None   Date of Referral:  11/20/16     Reason for Referral:  Behavioral Health Issues, including SI    Referral Source:  Central Nursery   Address:  [address] [place] [address]  Phone number:  [phone]   Household Members:  Self, Siblings, Significant Other (MOB and FOB resides with MOB's Grandmother. )   Natural Supports (not living in the home):   (FOB Family will also be a source of support for MOB and FOB.)   Professional Supports: None   Employment: Unemployed   Type of Work:     Education:  High school graduate   Financial Resources:  Medicaid   Other Resources:   (CSW provided MOB with information to apply for FS and WIC.)   Cultural/Religious Considerations Which May Impact Care:  Per MOB's Face Sheet, MOB is Holiness.   Strengths:  Ability to meet basic needs , Pediatrician chosen , Home prepared for child , Understanding of illness   Risk Factors/Current Problems:  Mental Health Concerns    Cognitive State:  Alert , Able to Concentrate , Linear Thinking , Insightful    Mood/Affect:  Bright , Interested , Comfortable , Relaxed , Happy    CSW Assessment: CSW met with MOB to complete an assessment for a consult for hx of anxiety.  MOB was inviting, polite, and interested in meeting with CSW. CSW provided CSW permission to meet with MOB while FOB and MOB's Grandmother, Verna Vari were present. CSW inquired about MOB's supports, and MOB stated that MOB's and FOB's immediate and extended family will be a source of support. MOB  expressed that she and FOB are excited about being new parents, and MOB feels like she is prepared for the baby. CSW observed MOB and FOB responding appropriate to infant and recognizing infant cues.  CSW also observed MOB struggling with breastfeeding and after several attempts, MOB decided to bottle feed infant.  CSW encouraged MOB to meet with Lactation to assist MOB with increasing MOB's breastfeeding skills.  CSW inquired about MOB's MH hx.  MOB acknowledged a hx of anxiety after being dx with Crohn's disease in 2012.  MOB stated that MOB has not experienced any signs or symptoms of anxiety in a while (MOB was unclear of date). CSW educated MOB about PPD. CSW informed MOB of possible supports and interventions to decrease PPD.  CSW also encouraged MOB to seek medical attention if needed for increased signs, symptoms for PPD. CSW offered MOB resources for outpatient MH counseling and interventions; MOB declined but agreed to contact MOB's OB or PCP if needed.CSW reviewed safe sleep and SIDS. MOB asked appropriate question and responded appropriately to CSW questions.  MOB communicated that she has a bassinet for the baby, and has read information regarding SIDS. CSW provided MOB with CSW contact information and encouraged MOB to contact CSW with any additional questions or concerns.     CSW Plan/Description:  Information/Referral to Community Resources , Patient/Family Education , No Further Intervention Required/No Barriers to Discharge   Tywan Siever Boyd-Gilyard, MSW, LCSW Clinical   Social Work [phone]   Kaeley Vinje D BOYD-GILYARD, LCSW 11/20/2016, 12:28 PM 

## 2016-11-20 NOTE — Progress Notes (Signed)
Post Partum Day#1 Subjective: no complaints, up ad lib, voiding and tolerating PO  Objective: Blood pressure 124/75, pulse 76, temperature 97.7 F (36.5 C), resp. rate 18, height 5' 7"  (1.702 m), weight 114.3 kg (252 lb), last menstrual period 02/16/2016, SpO2 98 %, unknown if currently breastfeeding.  Physical Exam:  General: alert Lochia: appropriate Uterine Fundus: firm and NT at U DVT Evaluation: No evidence of DVT seen on physical exam.   Recent Labs  11/19/16 0637  HGB 12.7  HCT 36.4    Assessment/Plan: Plan for discharge tomorrow  She reports that they have paid for baby's circumcision. She will follow up at the clinic at [facility] and will get a Nexplanon there.   LOS: 1 day   [place] 11/20/2016, 7:01 AM

## 2016-11-21 ENCOUNTER — Encounter (HOSPITAL_COMMUNITY): Payer: Self-pay | Admitting: *Deleted

## 2016-11-21 LAB — CULTURE, BETA STREP (GROUP B ONLY)

## 2016-11-21 MED ORDER — IBUPROFEN 600 MG PO TABS: 600.0000 mg | ORAL_TABLET | Freq: Four times a day (QID) | ORAL | 0 refills | Status: DC

## 2016-11-21 MED ORDER — ACETAMINOPHEN 325 MG PO TABS: 650.0000 mg | ORAL_TABLET | ORAL | 2 refills | Status: DC | PRN

## 2016-11-21 MED ORDER — SENNOSIDES-DOCUSATE SODIUM 8.6-50 MG PO TABS
2.0000 | ORAL_TABLET | ORAL | 1 refills | Status: DC
Start: 2016-11-22 — End: 2018-07-07

## 2016-11-21 MED ORDER — PRENATAL MULTIVITAMIN CH: 1.0000 | ORAL_TABLET | Freq: Every day | ORAL | 3 refills | Status: DC

## 2016-11-21 NOTE — Discharge Summary (Signed)
OB Discharge Summary     Patient Name: Deanna [name] DOB: [birth year] MRN: [id number]  Date of admission: 11/19/2016 Delivering MD: Serita Grammes D   Date of discharge: 11/21/2016  Admitting diagnosis: 23 WEEKS ROM CTX Intrauterine pregnancy: [redacted]w[redacted]d    Secondary diagnosis:  Active Problems:   Indication for care in labor or delivery  Additional problems: Chron's Disease, Juvenile Arthritis     Discharge diagnosis: Term Pregnancy, delivered                                                                                                Post partum procedures:none  Augmentation: none  Complications: None  Hospital course:  Onset of Labor With Vaginal Delivery     21y.o. yo G2P1011 at 418w0das admitted in Active Labor on 11/19/2016. Patient had an uncomplicated labor course as follows:  Membrane Rupture Time/Date: 5:45 AM ,11/19/2016   Intrapartum Procedures: Episiotomy: None [1]                                         Lacerations:  None [1]  Patient had a delivery of a Viable infant. 11/19/2016  Information for the patient's newborn:  WoPeni, Rupard0[[id number]]Delivery Method: Vag-Spont    Pateint had an uncomplicated postpartum course.  She is ambulating, tolerating a regular diet, passing flatus, and urinating well. Patient is discharged home in stable condition on 11/21/16.   Physical exam  Vitals:   11/19/16 1853 11/20/16 0614 11/20/16 1759 11/21/16 0544  BP: (!) 106/53 124/75 130/77 137/77  Pulse: 93 76 79 75  Resp: 18 18 18 18   Temp: 98.1 F (36.7 C) 97.7 F (36.5 C) 98.6 F (37 C) 98.2 F (36.8 C)  TempSrc: Oral  Oral Oral  SpO2:      Weight:      Height:       General: alert Lochia: appropriate Uterine Fundus: firm Incision: N/A DVT Evaluation: No evidence of DVT seen on physical exam. Labs: Lab Results  Component Value Date   WBC 12.0 (H) 11/19/2016   HGB 12.7 11/19/2016   HCT 36.4 11/19/2016   MCV 82.9 11/19/2016   PLT 181 11/19/2016    CMP Latest Ref Rng & Units 07/13/2016  Glucose 65 - 99 mg/dL 80  BUN 6 - 20 mg/dL 7  Creatinine 0.44 - 1.00 mg/dL 0.59  Sodium 135 - 145 mmol/L 135  Potassium 3.5 - 5.1 mmol/L 4.4  Chloride 101 - 111 mmol/L 105  CO2 22 - 32 mmol/L 24  Calcium 8.9 - 10.3 mg/dL 8.9  Total Protein 6.3 - 8.2 g/dL -  Total Bilirubin 0.2 - 1.1 mg/dL -  Alkaline Phos 47 - 176 U/L -  AST 12 - 32 U/L -  ALT 5 - 32 U/L -    Discharge instruction: per After Visit Summary and "Baby and Me Booklet".  After visit meds:  Allergies as of 11/21/2016   No Known Allergies     Medication List  STOP taking these medications   promethazine 25 MG tablet Commonly known as:  PHENERGAN     TAKE these medications   acetaminophen 325 MG tablet Commonly known as:  TYLENOL Take 2 tablets (650 mg total) by mouth every 4 (four) hours as needed (for pain scale < 4).   ibuprofen 600 MG tablet Commonly known as:  ADVIL,MOTRIN Take 1 tablet (600 mg total) by mouth every 6 (six) hours.   prenatal multivitamin Tabs tablet Take 1 tablet by mouth daily at 12 noon.   senna-docusate 8.6-50 MG tablet Commonly known as:  Senokot-S Take 2 tablets by mouth daily. Start taking on:  11/22/2016       Diet: routine diet  Activity: Advance as tolerated. Pelvic rest for 6 weeks.   Outpatient follow up:6 weeks, earlier if needed Follow up Appt:Future Appointments Date Time Provider [place]  12/30/2016 8:40 AM Lezlie Lye, NP [place] for [place] Follow up.   Specialty:  Obstetrics and Gynecology Why:  f/u postpartum in 4-6 weeks Contact information: [place] [facility].   Why:  as needed for emergency Contact information: [address] [id number] [place] [phone]          Follow  up Visit:No Follow-up on file.  Postpartum contraception: Nexplanon, will have placed in the office  Newborn Data: Live born female  Birth Weight: 6 lb 8.1 oz (2950 g) APGAR: 8, 9  Baby Feeding: breast and bottle Disposition:home with mother   11/21/2016 Allene Pyo do Lorra Hals, MD

## 2016-11-21 NOTE — Lactation Note (Signed)
This note was copied from a baby's chart. Lactation Consultation Note  Patient Name: Deanna [name] SHFWY'O Date: 11/21/2016 Reason for consult: Follow-up assessment  Baby 45 hours old , 5% weight loss, Difficult latch and has been using a #24 NS for latching,  And post circ.  Prior to baby coming back from circ, mom's breast are very full and lateral aspects of both breast with full nodules. [place] set up the DEBP and mom pumped both breast for 10 mins with 45 ml EBM.  When baby came back to the room, and LC assisted mom with latching with #24 NS on the right breast/ baby latched long enough to take the 4 ml of EBM in the NS and fell asleep.  LC burped the baby , and baby woke up, and LC showed  mom and dad how to PACE feed the baby. And dad finished the feeding and took 30 ml of EBM from a slow flow nipple.  LC reviewed sore nipple and engorgement prevention and tx.  Mom  obtained a DEBP [place] [facility] from this [place].  Mom receptive to come back for [facility] O/P appt. 3/7 at @:30 pm , appt. Reminder given to mom.  Referral for [facility] O/P - signed by Dr. Joneen Caraway in [place] upon D/C .  Mother informed of post-discharge support and given phone number to the lactation department, including services for phone call assistance; out-patient appointments; and breastfeeding support group. List of other breastfeeding resources in the community given in the handout. Encouraged mother to call for problems or concerns related to breastfeeding.    Maternal Data    Feeding Feeding Type: Breast Milk Nipple Type: Slow - flow Length of feed: 5 min (sleepy after a few sucks )  LATCH Score/Interventions Latch: Repeated attempts needed to sustain latch, nipple held in mouth throughout feeding, stimulation needed to elicit sucking reflex. Intervention(s): Skin to skin;Teach feeding cues;Waking techniques Intervention(s): Adjust position;Assist with latch;Breast massage;Breast compression  Audible Swallowing: A few with  stimulation  Type of Nipple: Everted at rest and after stimulation  Comfort (Breast/Nipple): Filling, red/small blisters or bruises, mild/mod discomfort     Hold (Positioning): Assistance needed to correctly position infant at breast and maintain latch. Intervention(s): Breastfeeding basics reviewed  LATCH Score: 6  Lactation Tools Discussed/Used Tools: Nipple Shields Nipple shield size: 24 Pump Review: Setup, frequency, and cleaning (reviewed / MAI )   Consult Status Consult Status: Follow-up Date: 11/25/16 (at 2:30 pm / appt reminder given to mom ) Follow-up type: [place] 11/21/2016, 2:57 PM

## 2016-11-21 NOTE — Progress Notes (Cosign Needed)
POSTPARTUM PROGRESS NOTE  Post Partum Day 2 Subjective:  Deanna [name] is a 21 y.o. [id number] 108w0ds/p SVD.  No acute events overnight.  Pt denies problems with ambulating, voiding or po intake.  She denies nausea or vomiting.  Pain is well controlled.  She has had flatus. She has not had bowel movement.  Lochia Small.   Objective: Blood pressure 137/77, pulse 75, temperature 98.2 F (36.8 C), temperature source Oral, resp. rate 18, height 5' 7"  (1.702 m), weight 252 lb (114.3 kg), last menstrual period 02/16/2016, SpO2 98 %, unknown if currently breastfeeding.  Physical Exam:  General: alert, cooperative and no distress Lochia:normal flow Chest: no respiratory distress Heart:regular rate, distal pulses intact Abdomen: soft, nontender,  Uterine Fundus: firm, appropriately tender DVT Evaluation: No calf swelling or tenderness Extremities: mild edema   Recent Labs  11/19/16 0637  HGB 12.7  HCT 36.4    Assessment/Plan:  Deanna [name] a 21y.o. [id number]/p SVD  Discharge home   LOS: 2 days   [place]Allene Pyoo [place]/11/2016, 7:50 AM

## 2016-11-23 ENCOUNTER — Other Ambulatory Visit: Payer: Self-pay | Admitting: Family Medicine

## 2016-11-26 ENCOUNTER — Inpatient Hospital Stay (HOSPITAL_COMMUNITY): Admission: RE | Admit: 2016-11-26 | Payer: BC Managed Care – PPO | Source: Ambulatory Visit

## 2016-12-22 ENCOUNTER — Encounter: Payer: Self-pay | Admitting: Family Medicine

## 2016-12-30 ENCOUNTER — Ambulatory Visit (INDEPENDENT_AMBULATORY_CARE_PROVIDER_SITE_OTHER): Payer: BC Managed Care – PPO | Admitting: Obstetrics and Gynecology

## 2016-12-30 NOTE — Progress Notes (Signed)
Subjective:     Deanna [name] is a 21 y.o. female who presents for a postpartum visit. She is six weeks postpartum following a delivery on 11/19/2016. I have fully reviewed the prenatal and intrapartum course. The delivery was at 40 gestational weeks. Outcome: vaginal. Anesthesia: Epidual. Postpartum course has been uncomplicated. Baby's course has been uncomplicated. Baby is feeding by breast. Bleeding small amount. Bowel function is normal. Bladder function is normal. Patient  sexually active. Contraception method is nothing at this time but desires Nexplanon. Postpartum depression screening: negative.  Review of Systems Pertinent items are noted in HPI.   Objective:    BP 127/86   Pulse 91   Wt 245 lb 9.6 oz (111.4 kg)   BMI 38.47 kg/m   General:  alert, cooperative and appears stated age  Lungs: clear to auscultation bilaterally  Heart:  regular rate and rhythm, S1, S2 normal, no murmur, click, rub or gallop  Abdomen: soft, non-tender; bowel sounds normal; no masses,  no organomegaly   Vulva:  not evaluated  Vagina: not evaluated        Assessment:    Normal postpartum exam. Pap smear not done at today's visit. Patient 21 years old.  Plan:   1. Contraception: none 2. Patient desires nexplanon, however had unprotected sex recently.  3. Patient to return in 2 weeks for pregnancy test, and nexplanon placement.  3. Follow up in: 2 weeks or as needed.      Lezlie Lye, NP 12/30/2016 9:38 AM

## 2016-12-30 NOTE — Patient Instructions (Signed)
Etonogestrel implant What is this medicine? ETONOGESTREL (et oh noe JES trel) is a contraceptive (birth control) device. It is used to prevent pregnancy. It can be used for up to 3 years. This medicine may be used for other purposes; ask your health care provider or pharmacist if you have questions. COMMON BRAND NAME(S): Implanon, Nexplanon What should I tell my health care provider before I take this medicine? They need to know if you have any of these conditions: -abnormal vaginal bleeding -blood vessel disease or blood clots -cancer of the breast, cervix, or liver -depression -diabetes -gallbladder disease -headaches -heart disease or recent heart attack -high blood pressure -high cholesterol -kidney disease -liver disease -renal disease -seizures -tobacco smoker -an unusual or allergic reaction to etonogestrel, other hormones, anesthetics or antiseptics, medicines, foods, dyes, or preservatives -pregnant or trying to get pregnant -breast-feeding How should I use this medicine? This device is inserted just under the skin on the inner side of your upper arm by a health care professional. Talk to your pediatrician regarding the use of this medicine in children. Special care may be needed. Overdosage: If you think you have taken too much of this medicine contact a poison control center or emergency room at once. NOTE: This medicine is only for you. Do not share this medicine with others. What if I miss a dose? This does not apply. What may interact with this medicine? Do not take this medicine with any of the following medications: -amprenavir -bosentan -fosamprenavir This medicine may also interact with the following medications: -barbiturate medicines for inducing sleep or treating seizures -certain medicines for fungal infections like ketoconazole and itraconazole -grapefruit juice -griseofulvin -medicines to treat seizures like carbamazepine, felbamate, oxcarbazepine,  phenytoin, topiramate -modafinil -phenylbutazone -rifampin -rufinamide -some medicines to treat HIV infection like atazanavir, indinavir, lopinavir, nelfinavir, tipranavir, ritonavir -St. John's wort This list may not describe all possible interactions. Give your health care provider a list of all the medicines, herbs, non-prescription drugs, or dietary supplements you use. Also tell them if you smoke, drink alcohol, or use illegal drugs. Some items may interact with your medicine. What should I watch for while using this medicine? This product does not protect you against HIV infection (AIDS) or other sexually transmitted diseases. You should be able to feel the implant by pressing your fingertips over the skin where it was inserted. Contact your doctor if you cannot feel the implant, and use a non-hormonal birth control method (such as condoms) until your doctor confirms that the implant is in place. If you feel that the implant may have broken or become bent while in your arm, contact your healthcare provider. What side effects may I notice from receiving this medicine? Side effects that you should report to your doctor or health care professional as soon as possible: -allergic reactions like skin rash, itching or hives, swelling of the face, lips, or tongue -breast lumps -changes in emotions or moods -depressed mood -heavy or prolonged menstrual bleeding -pain, irritation, swelling, or bruising at the insertion site -scar at site of insertion -signs of infection at the insertion site such as fever, and skin redness, pain or discharge -signs of pregnancy -signs and symptoms of a blood clot such as breathing problems; changes in vision; chest pain; severe, sudden headache; pain, swelling, warmth in the leg; trouble speaking; sudden numbness or weakness of the face, arm or leg -signs and symptoms of liver injury like dark yellow or brown urine; general ill feeling or flu-like symptoms;  light-colored  stools; loss of appetite; nausea; right upper belly pain; unusually weak or tired; yellowing of the eyes or skin -unusual vaginal bleeding, discharge -signs and symptoms of a stroke like changes in vision; confusion; trouble speaking or understanding; severe headaches; sudden numbness or weakness of the face, arm or leg; trouble walking; dizziness; loss of balance or coordination Side effects that usually do not require medical attention (report to your doctor or health care professional if they continue or are bothersome): -acne -back pain -breast pain -changes in weight -dizziness -general ill feeling or flu-like symptoms -headache -irregular menstrual bleeding -nausea -sore throat -vaginal irritation or inflammation This list may not describe all possible side effects. Call your doctor for medical advice about side effects. You may report side effects to FDA at 1-800-FDA-1088. Where should I keep my medicine? This drug is given in a hospital or clinic and will not be stored at home. NOTE: This sheet is a summary. It may not cover all possible information. If you have questions about this medicine, talk to your doctor, pharmacist, or health care provider.  2018 Elsevier/Gold Standard (2016-03-26 11:19:22)

## 2017-01-13 ENCOUNTER — Encounter: Payer: Self-pay | Admitting: Obstetrics and Gynecology

## 2017-01-13 ENCOUNTER — Ambulatory Visit (INDEPENDENT_AMBULATORY_CARE_PROVIDER_SITE_OTHER): Payer: BC Managed Care – PPO | Admitting: Obstetrics and Gynecology

## 2017-01-13 DIAGNOSIS — Z30017 Encounter for initial prescription of implantable subdermal contraceptive: Secondary | ICD-10-CM | POA: Insufficient documentation

## 2017-01-13 DIAGNOSIS — Z30014 Encounter for initial prescription of intrauterine contraceptive device: Secondary | ICD-10-CM | POA: Diagnosis not present

## 2017-01-13 DIAGNOSIS — Z308 Encounter for other contraceptive management: Secondary | ICD-10-CM | POA: Diagnosis not present

## 2017-01-13 LAB — POCT PREGNANCY, URINE: PREG TEST UR: NEGATIVE

## 2017-01-13 MED ORDER — ETONOGESTREL 68 MG ~~LOC~~ IMPL: 68.0000 mg | DRUG_IMPLANT | Freq: Once | SUBCUTANEOUS | Status: DC

## 2017-01-13 NOTE — Patient Instructions (Signed)
Etonogestrel implant What is this medicine? ETONOGESTREL (et oh noe JES trel) is a contraceptive (birth control) device. It is used to prevent pregnancy. It can be used for up to 3 years. This medicine may be used for other purposes; ask your health care provider or pharmacist if you have questions. COMMON BRAND NAME(S): Implanon, Nexplanon What should I tell my health care provider before I take this medicine? They need to know if you have any of these conditions: -abnormal vaginal bleeding -blood vessel disease or blood clots -cancer of the breast, cervix, or liver -depression -diabetes -gallbladder disease -headaches -heart disease or recent heart attack -high blood pressure -high cholesterol -kidney disease -liver disease -renal disease -seizures -tobacco smoker -an unusual or allergic reaction to etonogestrel, other hormones, anesthetics or antiseptics, medicines, foods, dyes, or preservatives -pregnant or trying to get pregnant -breast-feeding How should I use this medicine? This device is inserted just under the skin on the inner side of your upper arm by a health care professional. Talk to your pediatrician regarding the use of this medicine in children. Special care may be needed. Overdosage: If you think you have taken too much of this medicine contact a poison control center or emergency room at once. NOTE: This medicine is only for you. Do not share this medicine with others. What if I miss a dose? This does not apply. What may interact with this medicine? Do not take this medicine with any of the following medications: -amprenavir -bosentan -fosamprenavir This medicine may also interact with the following medications: -barbiturate medicines for inducing sleep or treating seizures -certain medicines for fungal infections like ketoconazole and itraconazole -grapefruit juice -griseofulvin -medicines to treat seizures like carbamazepine, felbamate, oxcarbazepine,  phenytoin, topiramate -modafinil -phenylbutazone -rifampin -rufinamide -some medicines to treat HIV infection like atazanavir, indinavir, lopinavir, nelfinavir, tipranavir, ritonavir -St. John's wort This list may not describe all possible interactions. Give your health care provider a list of all the medicines, herbs, non-prescription drugs, or dietary supplements you use. Also tell them if you smoke, drink alcohol, or use illegal drugs. Some items may interact with your medicine. What should I watch for while using this medicine? This product does not protect you against HIV infection (AIDS) or other sexually transmitted diseases. You should be able to feel the implant by pressing your fingertips over the skin where it was inserted. Contact your doctor if you cannot feel the implant, and use a non-hormonal birth control method (such as condoms) until your doctor confirms that the implant is in place. If you feel that the implant may have broken or become bent while in your arm, contact your healthcare provider. What side effects may I notice from receiving this medicine? Side effects that you should report to your doctor or health care professional as soon as possible: -allergic reactions like skin rash, itching or hives, swelling of the face, lips, or tongue -breast lumps -changes in emotions or moods -depressed mood -heavy or prolonged menstrual bleeding -pain, irritation, swelling, or bruising at the insertion site -scar at site of insertion -signs of infection at the insertion site such as fever, and skin redness, pain or discharge -signs of pregnancy -signs and symptoms of a blood clot such as breathing problems; changes in vision; chest pain; severe, sudden headache; pain, swelling, warmth in the leg; trouble speaking; sudden numbness or weakness of the face, arm or leg -signs and symptoms of liver injury like dark yellow or brown urine; general ill feeling or flu-like symptoms;  light-colored  stools; loss of appetite; nausea; right upper belly pain; unusually weak or tired; yellowing of the eyes or skin -unusual vaginal bleeding, discharge -signs and symptoms of a stroke like changes in vision; confusion; trouble speaking or understanding; severe headaches; sudden numbness or weakness of the face, arm or leg; trouble walking; dizziness; loss of balance or coordination Side effects that usually do not require medical attention (report to your doctor or health care professional if they continue or are bothersome): -acne -back pain -breast pain -changes in weight -dizziness -general ill feeling or flu-like symptoms -headache -irregular menstrual bleeding -nausea -sore throat -vaginal irritation or inflammation This list may not describe all possible side effects. Call your doctor for medical advice about side effects. You may report side effects to FDA at 1-800-FDA-1088. Where should I keep my medicine? This drug is given in a hospital or clinic and will not be stored at home. NOTE: This sheet is a summary. It may not cover all possible information. If you have questions about this medicine, talk to your doctor, pharmacist, or health care provider.  2018 Elsevier/Gold Standard (2016-03-26 11:19:22)

## 2017-01-13 NOTE — Progress Notes (Signed)
Deanna [name] is a 21 y.o. G2P1011 here for  Nexplanon insertion.    Nexplanon Insertion Procedure Patient identified, informed consent performed, consent signed.   Patient does understand that irregular bleeding is a very common side effect of this medication. She was advised to have backup contraception for one week after placement. Pregnancy test in clinic today was negative.  Appropriate time out taken.  Patient's left arm was prepped and draped in the usual sterile fashion.. The ruler used to measure and mark insertion area.  Patient was prepped with alcohol swab and then injected with 3 ml of 1% lidocaine.  She was prepped with betadine, Nexplanon removed from packaging,  Device confirmed in needle, then inserted full length of needle and withdrawn per handbook instructions. Nexplanon was able to palpated in the patient's arm; patient palpated the insert herself. There was minimal blood loss.  Patient insertion site covered with guaze and a pressure bandage to reduce any bruising.  The patient tolerated the procedure well and was given post procedure instructions.     Chancy Milroy, MD, [place] Attending [place] for [facility], [place]

## 2018-05-25 IMAGING — US US OB LIMITED
1 series · 14 of 25 positions shown · non-contrast
Comparison: none

CLINICAL DATA: Spotting for 2 days.

EXAM:
LIMITED OBSTETRIC ULTRASOUND

[Series 1: us ob limited · 0.23mm/px · 25 acquisitions, 14 frames shown]
[im 1/25]
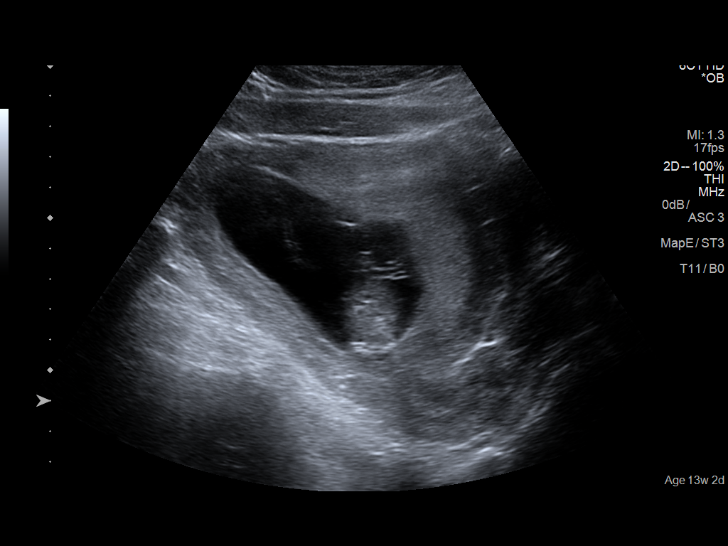
[im 3/25]
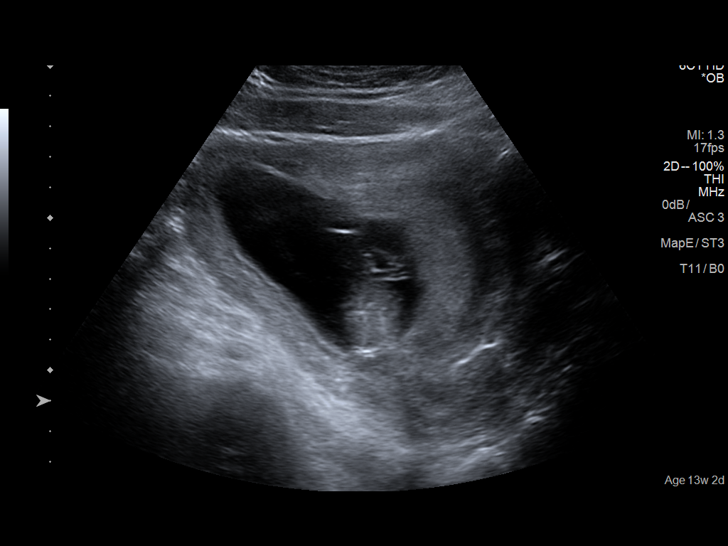
[im 5/25]
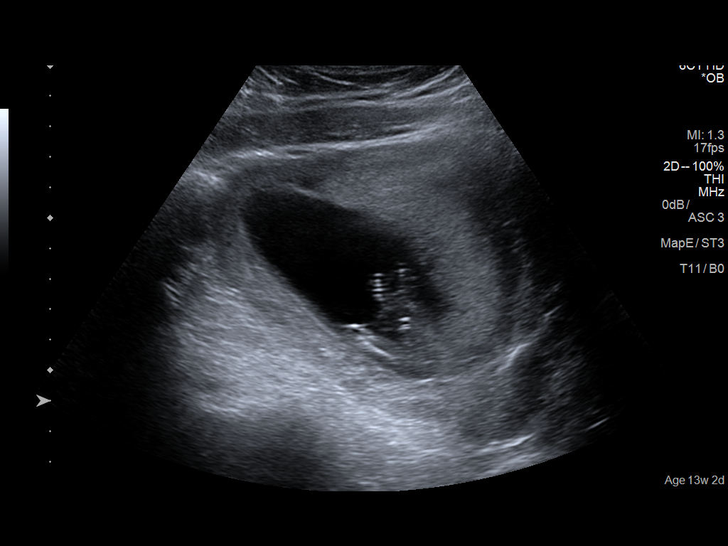
[im 7/25]
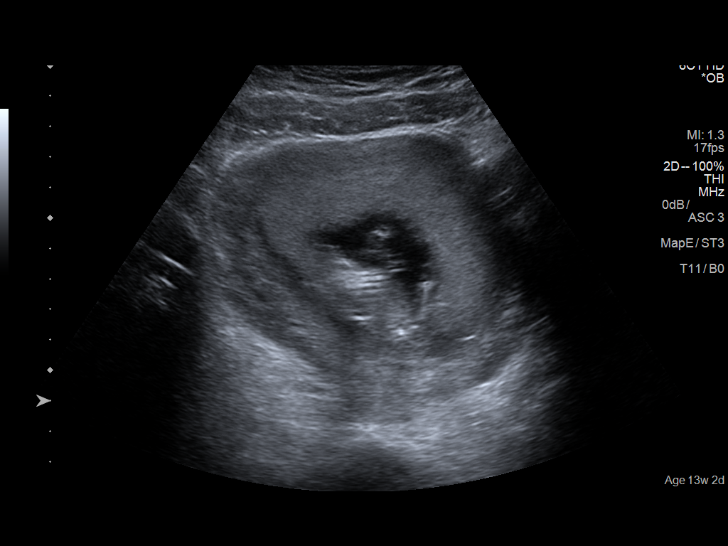
[im 9/25]
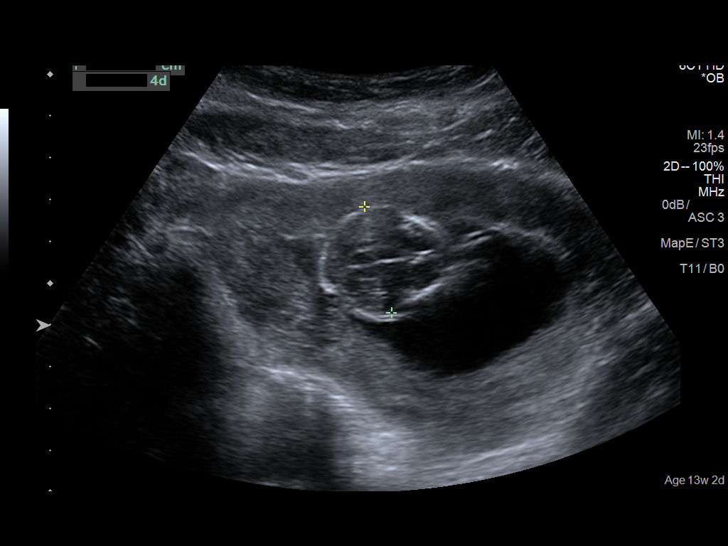
[im 10/25]
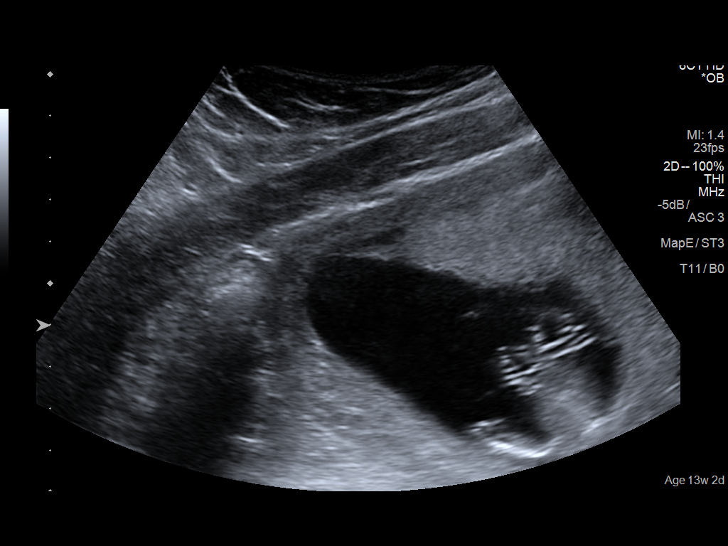
[im 12/25]
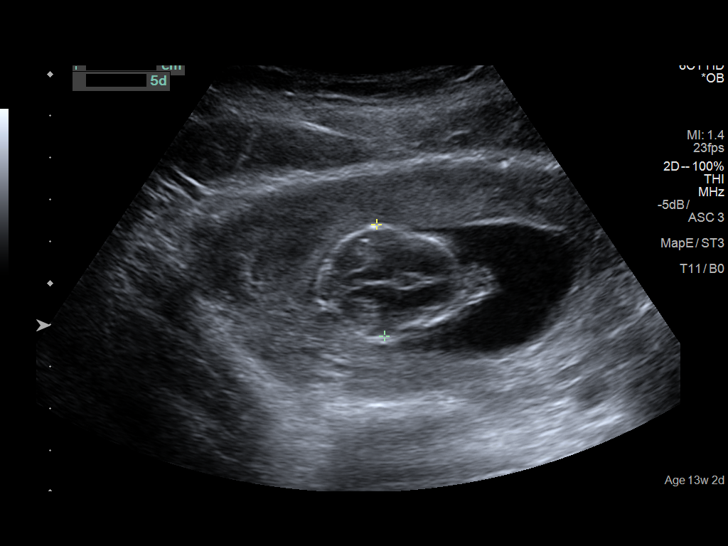
[im 14/25]
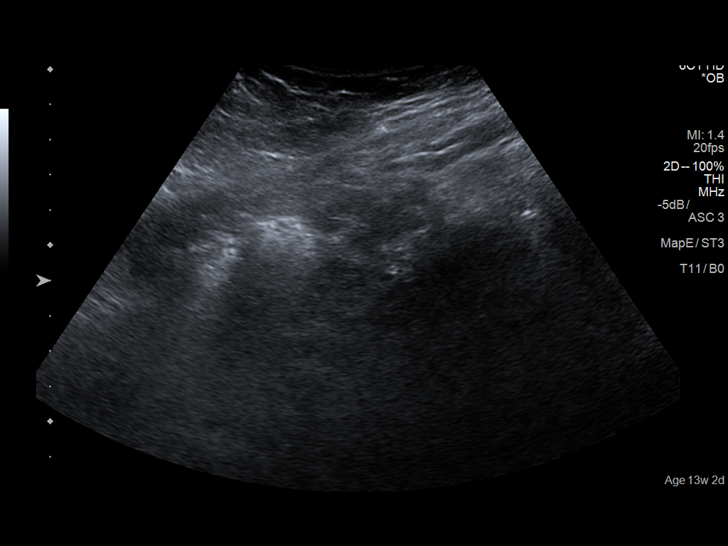
[im 16/25]
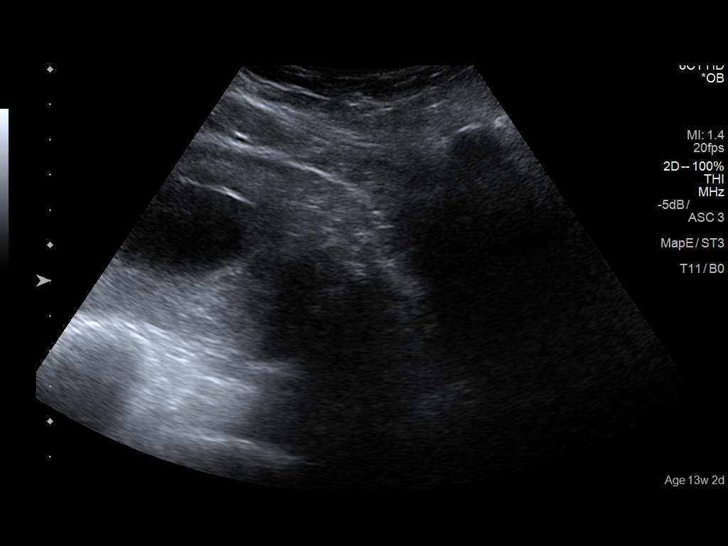
[im 17/25]
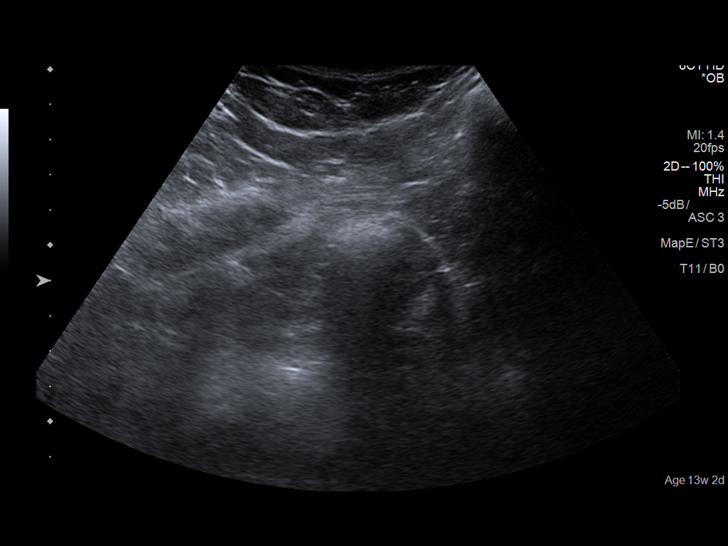
[im 19/25]
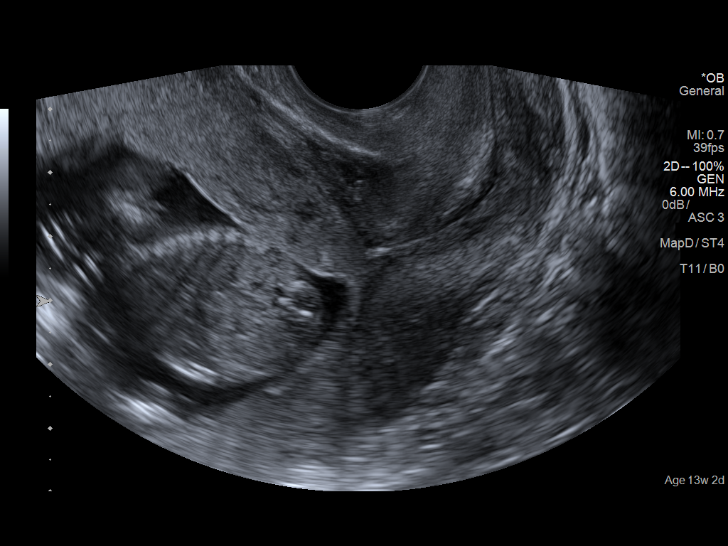
[im 21/25]
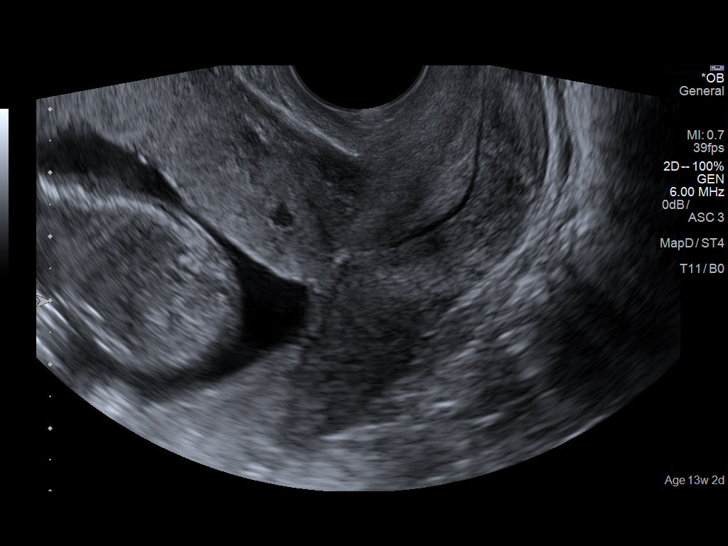
[im 23/25]
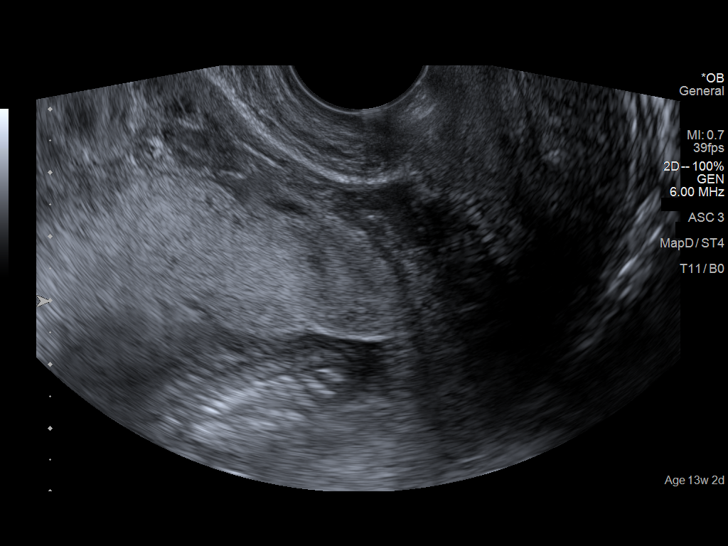
[im 25/25]
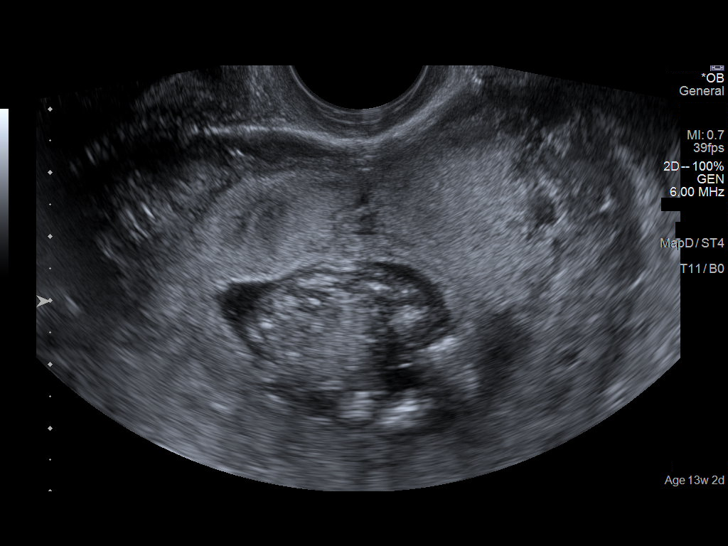

[14 of 25 positions shown; findings below may reference images not displayed]

FINDINGS: Number of Fetuses:  1

Heart Rate:  150 bpm

Movement:  Yes

Presentation: Variable

Placental Location: Anterior

Previa: Complete previa, with blood around the inferior margin of
the placenta.

Amniotic Fluid (Subjective):  Within normal limits.

BPD:  2.65cm 14w  5d

MATERNAL FINDINGS:

Cervix:  Appears closed.

Uterus/Adnexae:  No abnormality visualized.
IMPRESSION: The anterior placenta extends across the internal cervical os,
consistent with complete previa. There is hemorrhage at the lower
edge of the placenta.

These results were called by telephone at the time of interpretation
on 05/19/2016 at [DATE] to the ED physician.

This exam is performed on an emergent basis and does not
comprehensively evaluate fetal size, dating, or anatomy; follow-up
complete OB US should be considered if further fetal assessment is
warranted.

## 2018-07-07 ENCOUNTER — Ambulatory Visit (INDEPENDENT_AMBULATORY_CARE_PROVIDER_SITE_OTHER): Payer: BC Managed Care – PPO | Admitting: Obstetrics and Gynecology

## 2018-07-07 ENCOUNTER — Encounter: Payer: Self-pay | Admitting: Obstetrics and Gynecology

## 2018-07-07 ENCOUNTER — Other Ambulatory Visit (HOSPITAL_COMMUNITY)
Admission: RE | Admit: 2018-07-07 | Discharge: 2018-07-07 | Disposition: A | Discharge status: Home / Self Care | Payer: BC Managed Care – PPO | Source: Ambulatory Visit | Attending: Obstetrics and Gynecology | Admitting: Obstetrics and Gynecology

## 2018-07-07 VITALS — BP 125/88 | HR 101 | Ht 67.0 in | Wt 235.2 lb

## 2018-07-07 DIAGNOSIS — Z975 Presence of (intrauterine) contraceptive device: Secondary | ICD-10-CM | POA: Insufficient documentation

## 2018-07-07 DIAGNOSIS — Z01419 Encounter for gynecological examination (general) (routine) without abnormal findings: Secondary | ICD-10-CM | POA: Diagnosis present

## 2018-07-07 DIAGNOSIS — R87613 High grade squamous intraepithelial lesion on cytologic smear of cervix (HGSIL): Secondary | ICD-10-CM | POA: Insufficient documentation

## 2018-07-07 DIAGNOSIS — Z202 Contact with and (suspected) exposure to infections with a predominantly sexual mode of transmission: Secondary | ICD-10-CM

## 2018-07-07 NOTE — Patient Instructions (Signed)

## 2018-07-07 NOTE — Progress Notes (Signed)
Deanna [name] is a 22 y.o. G22P1011 female here for a routine annual gynecologic exam. She has no GYN complaints. Desires STD testing. Nexplanon in place. Cycles are irregular.     Gynecologic History Patient's last menstrual period was 06/21/2018 (approximate). Contraception: Nexplanon Last Pap: NA. Results were: NA Last mammogram: NA. Results were: NA  Obstetric History OB History  Gravida Para Term Preterm AB Living  2 1 1  0 1 1  SAB TAB Ectopic Multiple Live Births  0 1 0 0 1    # Outcome Date GA Lbr Len/2nd Weight Sex Delivery Anes PTL Lv  2 Term 11/19/16 23w0d24:00 / 00:10 6 lb 8.1 oz (2.95 kg) M Vag-Spont None  LIV  1 TAB 04/2015            Past Medical History:  Diagnosis Date  . Arthritis, 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[place]   . Crohn disease ([place]   . Infection    UTI  . Inflammatory bowel disease    chrohn's disease    Past Surgical History:  Procedure Laterality Date  . NO PAST SURGERIES      Current Outpatient Medications on File Prior to Visit  Medication Sig Dispense Refill  . acetaminophen (TYLENOL) 325 MG tablet Take 2 tablets (650 mg total) by mouth every 4 (four) hours as needed (for pain scale < 4). 30 tablet 2   Current Facility-Administered Medications on File Prior to Visit  Medication Dose Route Frequency Provider Last Rate Last Dose  . etonogestrel (NEXPLANON) implant 68 mg  68 mg Subdermal Once EChancy Milroy MD        No Known Allergies  Social History   Socioeconomic History  . Marital status: Significant Other    Spouse name: Not on file  . Number of children: Not on file  . Years of education: Not on file  . Highest education level: Not on file  Occupational History  . Not on file  Social Needs  . Financial resource strain: Not on file  . Food insecurity:    Worry: Not on file    Inability: Not on file  . Transportation needs:    Medical: Not on file    Non-medical: Not on file  Tobacco Use  . Smoking status: Former Smoker     Types: E-cigarettes  . Smokeless tobacco: Never Used  . Tobacco comment: vaping in high school  Substance and Sexual Activity  . Alcohol use: No  . Drug use: No  . Sexual activity: Yes  Lifestyle  . Physical activity:    Days per week: Not on file    Minutes per session: Not on file  . Stress: Not on file  Relationships  . Social connections:    Talks on phone: Not on file    Gets together: Not on file    Attends religious service: Not on file    Active member of club or organization: Not on file    Attends meetings of clubs or organizations: Not on file    Relationship status: Not on file  . Intimate partner violence:    Fear of current or ex partner: Not on file    Emotionally abused: Not on file    Physically abused: Not on file    Forced sexual activity: Not on file  Other Topics Concern  . Not on file  Social History Narrative  . Not on file    Family History  Problem Relation Age of Onset  . Diabetes Maternal  Grandmother   . Cancer Maternal Aunt        stomach  . Diabetes Maternal Grandfather   . Heart disease Maternal Grandfather   . Cancer Maternal Grandfather        colon  . Stroke Maternal Grandfather     The following portions of the patient's history were reviewed and updated as appropriate: allergies, current medications, past family history, past medical history, past social history, past surgical history and problem list.  Review of Systems Pertinent items noted in HPI and remainder of comprehensive ROS otherwise negative.   Objective:  BP 125/88   Pulse (!) 101   Ht 5' 7"  (1.702 m)   Wt 235 lb 3.2 oz (106.7 kg)   LMP 06/21/2018 (Approximate)   BMI 36.84 kg/m  CONSTITUTIONAL: Well-developed, well-nourished female in no acute distress.  HENT:  Normocephalic, atraumatic, External right and left ear normal. Oropharynx is clear and moist EYES: Conjunctivae and EOM are normal. Pupils are equal, round, and reactive to light. No scleral icterus.   NECK: Normal range of motion, supple, no masses.  Normal thyroid.  SKIN: Skin is warm and dry. No rash noted. Not diaphoretic. No erythema. No pallor. [place]: Alert and oriented to person, place, and time. Normal reflexes, muscle tone coordination. No cranial nerve deficit noted. PSYCHIATRIC: Normal mood and affect. Normal behavior. Normal judgment and thought content. CARDIOVASCULAR: Normal heart rate noted, regular rhythm RESPIRATORY: Clear to auscultation bilaterally. Effort and breath sounds normal, no problems with respiration noted. BREASTS: Deffered ABDOMEN: Soft, normal bowel sounds, no distention noted.  No tenderness, rebound or guarding.  PELVIC: Normal appearing external genitalia; normal appearing vaginal mucosa and cervix.  No abnormal discharge noted.  Pap smear obtained.  Normal uterine size, no other palpable masses, no uterine or adnexal tenderness. MUSCULOSKELETAL: Normal range of motion. No tenderness.  No cyanosis, clubbing, or edema.  2+ distal pulses.   Assessment:  Annual gynecologic examination with pap smear  STD test Plan:  Will follow up results of pap smear and manage accordingly. STD testing as per pt request Routine preventative health maintenance measures emphasized. Please refer to After Visit Summary for other counseling recommendations.    Chancy Milroy, MD, [place] Attending [place] for [facility], [place]

## 2018-07-08 LAB — HEPATITIS PANEL, ACUTE
HEP A IGM: NEGATIVE
HEP B C IGM: NEGATIVE
Hep C Virus Ab: <0.1 s/co ratio (ref 0.0–0.9)
Hepatitis B Surface Ag: NEGATIVE

## 2018-07-08 LAB — RPR: RPR Ser Ql: NONREACTIVE

## 2018-07-08 LAB — HIV ANTIBODY (ROUTINE TESTING W REFLEX): HIV Screen 4th Generation wRfx: NONREACTIVE

## 2018-07-11 LAB — CYTOLOGY - PAP
Chlamydia: NEGATIVE
Diagnosis: HIGH — AB
Neisseria Gonorrhea: NEGATIVE

## 2018-07-13 ENCOUNTER — Telehealth: Payer: Self-pay | Admitting: *Deleted

## 2018-07-13 NOTE — Telephone Encounter (Signed)
-----   Message from Chancy Milroy, MD sent at 07/13/2018  2:37 PM EDT ----- Please let pt that her pap smear was abnormal. She needs to be scheduled for colpo Thanks Legrand Como

## 2018-07-13 NOTE — Telephone Encounter (Signed)
Per Dr.Ervin I called Dorianne and left a message on her mobile voice mail that I am calling with non-urgent information and will send a MYchart message also. If you have questions , call us. You will also get a second call with an appointment . I called her home number and heard messge call can not be completed.

## 2018-09-01 ENCOUNTER — Ambulatory Visit: Payer: Self-pay | Admitting: Obstetrics and Gynecology

## 2018-09-21 NOTE — L&D Delivery Note (Signed)
Delivery Note We had planned to check patient's cervix at 1400hrs.  RN went to examine patient and fetal head delivered.  Other RN Ileana Ladd RN delivered baby as I approached bed.   Pt states first baby delivered precipitously also  At 2:02 PM a viable and healthy female was delivered via  (Presentation: Left Occiput Anterior).  APGAR: , ; weight 4 lb 12.9 oz (2180 g).   Placenta status: Spontaneous, Grossly Intact.  Cord: 3 vessels with the following complications: Possible very small abruption.  Moderate amount of Dark red blood passed with delivery.    Anesthesia: Epidural Episiotomy: None Lacerations: None Suture Repair: none needed Est. Blood Loss (mL): 100   Mom to postpartum.  Baby to Couplet care / Skin to Skin.  Hansel Feinstein 09/11/2019, 2:31 PM  Please schedule this patient for Postpartum visit in: 4 weeks with the following provider: Any provider For C/S patients schedule nurse incision check in weeks 2 weeks: no High risk pregnancy complicated by: IUGR, Crohn's Delivery mode:  SVD Anticipated Birth Control:  Nexplanon PP Procedures needed: none  Schedule Integrated BH visit: no

## 2018-10-15 ENCOUNTER — Other Ambulatory Visit: Payer: Self-pay

## 2018-10-15 ENCOUNTER — Encounter (HOSPITAL_COMMUNITY): Payer: Self-pay

## 2018-10-15 ENCOUNTER — Emergency Department (HOSPITAL_COMMUNITY)
Admission: EM | Admit: 2018-10-15 | Discharge: 2018-10-15 | Disposition: A | Discharge status: Home / Self Care | Payer: BC Managed Care – PPO | Attending: Emergency Medicine | Admitting: Emergency Medicine

## 2018-10-15 DIAGNOSIS — L03818 Cellulitis of other sites: Secondary | ICD-10-CM | POA: Diagnosis not present

## 2018-10-15 DIAGNOSIS — J00 Acute nasopharyngitis [common cold]: Secondary | ICD-10-CM | POA: Insufficient documentation

## 2018-10-15 DIAGNOSIS — Z5321 Procedure and treatment not carried out due to patient leaving prior to being seen by health care provider: Secondary | ICD-10-CM | POA: Insufficient documentation

## 2018-10-15 NOTE — ED Triage Notes (Signed)
Pt states she has had a cold x 1 week with cough. Pt states she also has a boil in her genital area. Pt states she noticed this 2 days ago. Pt also c/o headaches and nausea

## 2018-10-26 ENCOUNTER — Ambulatory Visit: Payer: BC Managed Care – PPO | Admitting: Family Medicine

## 2018-10-26 ENCOUNTER — Encounter: Payer: Self-pay | Admitting: Family Medicine

## 2018-10-26 ENCOUNTER — Other Ambulatory Visit: Payer: Self-pay

## 2018-10-26 VITALS — BP 132/80 | HR 98 | Temp 98.4°F | Resp 14 | Ht 67.0 in | Wt 241.0 lb

## 2018-10-26 DIAGNOSIS — K50813 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: Secondary | ICD-10-CM | POA: Diagnosis not present

## 2018-10-26 DIAGNOSIS — F418 Other specified anxiety disorders: Secondary | ICD-10-CM

## 2018-10-26 DIAGNOSIS — N3001 Acute cystitis with hematuria: Secondary | ICD-10-CM | POA: Diagnosis not present

## 2018-10-26 DIAGNOSIS — M0890 Juvenile arthritis, unspecified, unspecified site: Secondary | ICD-10-CM

## 2018-10-26 DIAGNOSIS — Z309 Encounter for contraceptive management, unspecified: Secondary | ICD-10-CM

## 2018-10-26 DIAGNOSIS — R5382 Chronic fatigue, unspecified: Secondary | ICD-10-CM | POA: Diagnosis not present

## 2018-10-26 DIAGNOSIS — M0880 Other juvenile arthritis, unspecified site: Secondary | ICD-10-CM

## 2018-10-26 NOTE — Patient Instructions (Signed)
Keflex ( Cephalaxin ) 522m, take 1 twice a day for 5 days  REFERRAL to GI Referral to therapy F/U pending lab results

## 2018-10-26 NOTE — Progress Notes (Signed)
Subjective:    Patient ID: Deanna [name], female    DOB: 05-28-1996, 23 y.o.   MRN: [id number]  Patient presents for Follow-up (is not fasting- would like check up to see if her increased issues with Crhons is medical or anxiety related)   Currently has nexplanon in her arm, after her son was born in 2018  Her menses before was lasting many days, but now has intermittanat bleeding  After her menses gets fatigued and irritbale  Has had UTI symptoms- painful with urination, difficulty urinating for past few days  Had  ingrown hair on labia-Give keflex from UC but she did not complete, has a lot of meds at home Decreased appetite, feels tired, feels like something is off in her body   Crohns has not been on medication for past 3 years - has had cramping, decreased appetite, constipation , would like to establish with GI again  JRA- had flares in her knee a few months ago, treated with ibuprofen, this as not caused her much issues    Working- finishes up degree at [employer]  She has a 8 year old son, father is not helping Lives with her grandmother  she has been under a lot of stress trying to do everything with little to no support from childs father, feels overwhelmed a lot  Review Of Systems:  GEN- +fatigue, fever, weight loss,weakness, recent illness HEENT- denies eye drainage, change in vision, nasal discharge, CVS- denies chest pain, palpitations RESP- denies SOB, cough, wheeze ABD- denies N/V, change in stools, abd pain GU-+dysuria, denies  hematuria, dribbling, incontinence MSK- denies joint pain, muscle aches, injury Neuro- denies headache, dizziness, syncope, seizure activity       Objective:    BP 132/80   Pulse 98   Temp 98.4 F (36.9 C) (Oral)   Resp 14   Ht 5' 7"  (1.702 m)   Wt 241 lb (109.3 kg)   SpO2 99%   BMI 37.75 kg/m  GEN- NAD, alert and oriented x3 HEENT- PERRL, EOMI, non injected sclera, pink conjunctiva, MMM, oropharynx clear Neck- Supple, no  thyromegaly CVS- RRR, no murmur RESP-CTAB ABD-NABS,soft,NT,ND, no CVA tenderness MSK - no joint effusion Psych- normal affect and mood, pleasant EXT- No edema Pulses- Radial, DP- 2+        Assessment & Plan:      Problem List Items Addressed This Visit      Unprioritized   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[place]) - Primary    Will get her established with an adult GI  Seems  To be having some IBS symptoms as well Which may be anxiety provoked, she is single parent, in school and trying to work part time      Relevant Orders   CBC with Differential/Platelet (Completed)   Comprehensive metabolic panel (Completed)   TSH (Completed)   Ambulatory referral to Gastroenterology   Depression with anxiety    Referral for counseling, she is very young and has a lot on her plate       Relevant Orders   Ambulatory referral to Psychology   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[place])    Very few flares, uses prn OTC NSAIDS      Relevant Orders   C-reactive protein (Completed)   Sedimentation Rate (Completed)    Other Visit Diagnoses    Chronic fatigue       Relevant Orders   TSH (Completed)   Iron (Completed)   C-reactive protein (Completed)  Acute cystitis with hematuria       will have her take keflex 554m twice a day for 5 days   Relevant Orders   Urinalysis, Routine w reflex microscopic (Completed)   Urine Culture   Encounter for contraceptive management, unspecified type       she wants to keep nexplanon at this time      Note: This dictation was prepared with Dragon dictation along with smaller phrase technology. Any transcriptional errors that result from this process are unintentional.

## 2018-10-27 ENCOUNTER — Encounter: Payer: Self-pay | Admitting: Family Medicine

## 2018-10-27 DIAGNOSIS — F418 Other specified anxiety disorders: Secondary | ICD-10-CM | POA: Insufficient documentation

## 2018-10-27 LAB — CBC WITH DIFFERENTIAL/PLATELET
Absolute Monocytes: 634 cells/uL (ref 200–950)
BASOS ABS: 43 {cells}/uL (ref 0–200)
Basophils Relative: 0.7 %
EOS ABS: 128 {cells}/uL (ref 15–500)
EOS PCT: 2.1 %
HEMATOCRIT: 40 % (ref 35.0–45.0)
HEMOGLOBIN: 13 g/dL (ref 11.7–15.5)
Lymphs Abs: 1995 cells/uL (ref 850–3900)
MCH: 27.8 pg (ref 27.0–33.0)
MCHC: 32.5 g/dL (ref 32.0–36.0)
MCV: 85.7 fL (ref 80.0–100.0)
MONOS PCT: 10.4 %
MPV: 11.1 fL (ref 7.5–12.5)
NEUTROS PCT: 54.1 %
Neutro Abs: 3300 cells/uL (ref 1500–7800)
Platelets: 276 10*3/uL (ref 140–400)
RBC: 4.67 10*6/uL (ref 3.80–5.10)
RDW: 13.6 % (ref 11.0–15.0)
Total Lymphocyte: 32.7 %
WBC: 6.1 10*3/uL (ref 3.8–10.8)

## 2018-10-27 LAB — COMPREHENSIVE METABOLIC PANEL
AG RATIO: 1 (calc) (ref 1.0–2.5)
ALT: 9 U/L (ref 6–29)
AST: 11 U/L (ref 10–30)
Albumin: 3.5 g/dL — ABNORMAL LOW (ref 3.6–5.1)
Alkaline phosphatase (APISO): 64 U/L (ref 31–125)
BILIRUBIN TOTAL: 0.3 mg/dL (ref 0.2–1.2)
BUN: 16 mg/dL (ref 7–25)
CALCIUM: 8.7 mg/dL (ref 8.6–10.2)
CHLORIDE: 107 mmol/L (ref 98–110)
CO2: 25 mmol/L (ref 20–32)
Creat: 0.82 mg/dL (ref 0.50–1.10)
GLOBULIN: 3.6 g/dL (ref 1.9–3.7)
GLUCOSE: 69 mg/dL (ref 65–99)
Potassium: 4.3 mmol/L (ref 3.5–5.3)
SODIUM: 141 mmol/L (ref 135–146)
Total Protein: 7.1 g/dL (ref 6.1–8.1)

## 2018-10-27 LAB — SEDIMENTATION RATE: Sed Rate: 43 mm/h — ABNORMAL HIGH (ref 0–20)

## 2018-10-27 LAB — C-REACTIVE PROTEIN: CRP: 22.5 mg/L — AB (ref <8.0)

## 2018-10-27 LAB — TSH: TSH: 1.24 m[IU]/L

## 2018-10-27 LAB — IRON: Iron: 28 ug/dL — ABNORMAL LOW (ref 40–190)

## 2018-10-27 NOTE — Assessment & Plan Note (Signed)
Will get her established with an adult GI  Seems  To be having some IBS symptoms as well Which may be anxiety provoked, she is single parent, in school and trying to work part time

## 2018-10-27 NOTE — Assessment & Plan Note (Signed)
Very few flares, uses prn OTC NSAIDS

## 2018-10-27 NOTE — Assessment & Plan Note (Signed)
Referral for counseling, she is very young and has a lot on her plate

## 2018-10-28 ENCOUNTER — Encounter: Payer: Self-pay | Admitting: Gastroenterology

## 2018-10-28 LAB — URINALYSIS, ROUTINE W REFLEX MICROSCOPIC
Bilirubin Urine: NEGATIVE
GLUCOSE, UA: NEGATIVE
Hyaline Cast: NONE SEEN /LPF
Ketones, ur: NEGATIVE
NITRITE: NEGATIVE
PH: 6.5 (ref 5.0–8.0)
SQUAMOUS EPITHELIAL / LPF: NONE SEEN /HPF (ref <=5)
Specific Gravity, Urine: 1.027 (ref 1.001–1.03)
WBC, UA: >=60 /HPF — AB (ref 0–5)

## 2018-10-28 LAB — URINE CULTURE
MICRO NUMBER:: 154556
Result:: NO GROWTH
SPECIMEN QUALITY:: ADEQUATE

## 2018-10-31 ENCOUNTER — Other Ambulatory Visit: Payer: Self-pay | Admitting: *Deleted

## 2018-10-31 ENCOUNTER — Telehealth: Payer: Self-pay | Admitting: Family Medicine

## 2018-10-31 MED ORDER — CEPHALEXIN 500 MG PO CAPS: 500.0000 mg | ORAL_CAPSULE | Freq: Three times a day (TID) | ORAL | 0 refills | Status: DC

## 2018-10-31 MED ORDER — FLUCONAZOLE 150 MG PO TABS
150.0000 mg | ORAL_TABLET | Freq: Once | ORAL | 0 refills | Status: AC
Start: 2018-10-31 — End: 2018-10-31

## 2018-10-31 NOTE — Telephone Encounter (Signed)
Please see lab results for more information.

## 2018-10-31 NOTE — Telephone Encounter (Signed)
Patient still having uti symptoms would like a call back at [phone]

## 2018-11-02 ENCOUNTER — Ambulatory Visit: Payer: BC Managed Care – PPO | Admitting: Family Medicine

## 2018-11-02 ENCOUNTER — Encounter: Payer: Self-pay | Admitting: Family Medicine

## 2018-11-02 VITALS — BP 104/68 | HR 96 | Temp 98.4°F | Resp 14 | Ht 67.0 in | Wt 237.0 lb

## 2018-11-02 DIAGNOSIS — N760 Acute vaginitis: Secondary | ICD-10-CM | POA: Diagnosis not present

## 2018-11-02 DIAGNOSIS — N939 Abnormal uterine and vaginal bleeding, unspecified: Secondary | ICD-10-CM | POA: Diagnosis not present

## 2018-11-02 DIAGNOSIS — F418 Other specified anxiety disorders: Secondary | ICD-10-CM | POA: Diagnosis not present

## 2018-11-02 DIAGNOSIS — N390 Urinary tract infection, site not specified: Secondary | ICD-10-CM | POA: Diagnosis not present

## 2018-11-02 DIAGNOSIS — B9689 Other specified bacterial agents as the cause of diseases classified elsewhere: Secondary | ICD-10-CM

## 2018-11-02 LAB — MICROSCOPIC MESSAGE

## 2018-11-02 LAB — PREGNANCY, URINE: Preg Test, Ur: NEGATIVE

## 2018-11-02 LAB — URINALYSIS, ROUTINE W REFLEX MICROSCOPIC
BILIRUBIN URINE: NEGATIVE
GLUCOSE, UA: NEGATIVE
KETONES UR: NEGATIVE
NITRITE: NEGATIVE
PH: 6.5 (ref 5.0–8.0)
PROTEIN: NEGATIVE
Specific Gravity, Urine: 1.025 (ref 1.001–1.03)

## 2018-11-02 LAB — WET PREP FOR TRICH, YEAST, CLUE

## 2018-11-02 MED ORDER — FLUOXETINE HCL 10 MG PO TABS: 10.0000 mg | ORAL_TABLET | Freq: Every day | ORAL | 3 refills | Status: DC

## 2018-11-02 MED ORDER — METRONIDAZOLE 500 MG PO TABS: 500.0000 mg | ORAL_TABLET | Freq: Two times a day (BID) | ORAL | 0 refills | Status: DC

## 2018-11-02 NOTE — Patient Instructions (Signed)
Start prozac once a day  Complete antibiotics Continue iron tablets F/U 1 month

## 2018-11-02 NOTE — Assessment & Plan Note (Signed)
Start prozac 49m , she will see therapist next month.  We will follow-up in 1 month to see how she is doing on her medications.

## 2018-11-02 NOTE — Progress Notes (Signed)
Subjective:    Patient ID: Deanna [name], female    DOB: 01-17-1996, 23 y.o.   MRN: [id number]  Patient presents for Discuss life issues (Tried to make apt with therapist but they were booking out a month ahead) and Follow-up (UTI)  Feb  26th - Kimberly Bea[facility] - GI -has an appointment for her Crohn's she is also taking her iron tablets for her iron deficiency anemia [facility] -March    Still has vaginal bleeding, mucous, no itching, she has not been sexually active recently.  She does have Nexplanon in place was having no cycle almost every month though sometimes it will be short other month longer.  She only had 1 month last year where she did not have a period at all.  She was unclear she has had spotting on and off for the past couple of weeks until now it is been more steady flow she is not sure if this is just irregular.  Or if she has some type of infection.  She has been on the antibiotics for urinary tract infection Keflex which she individually was given by urgent care secondary to an infected ingrown hair on the labia.  She states that the swelling from the ingrown hair has gone down significantly she does not have any pain in that region.   Depression anxiety we discussed some of this at the last visit she is juggling many had she is in college she works part-time she has a 17-year-old which the father is not involved.  She lives with her elderly grandmother who also helps with her child but she feels guilt over this because of her grandmothers age.  She is trying to hold everything together at times is gets very overwhelmed.  Her sleep is fair.  She has had some anxiety issues as well specially with public speaking at school and different projects.  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  After talking to her mother she would like to start some type of medication to help with her anxiety and depression.  She does have her first counseling  appointment but this is not until March.              Review Of Systems:  GEN- denies fatigue, fever, weight loss,weakness, recent illness HEENT- denies eye drainage, change in vision, nasal discharge, CVS- denies chest pain, palpitations RESP- denies SOB, cough, wheeze ABD- denies N/V, change in stools, abd pain GU- denies dysuria, hematuria, dribbling, incontinence MSK- denies joint pain, muscle aches, injury Neuro- denies headache, dizziness, syncope, seizure activity       Objective:    BP 104/68   Pulse 96   Temp 98.4 F (36.9 C) (Oral)   Resp 14   Ht 5' 7"  (1.702 m)   Wt 237 lb (107.5 kg)   SpO2 98%   BMI 37.12 kg/m  GEN- NAD, alert and oriented x3 CVS- RRR, no murmur RESP-CTAB ABD-NABS,soft,NT,ND GU- normal external genitalia, small ingrown hair with mild induration, left lower labia, vaginal mucosa pink and moist, cervix visualized no growth, + blood form os, no CMT, no ovarian masses, uterus normal size Psych- normal affect and mood, well groomed, no apparent SI Pulses- Radial  2+   Urine pregnancy test negative     Assessment & Plan:      Problem List Items Addressed This Visit      Unprioritized   Depression with anxiety    Start prozac 33m ,  she will see therapist next month.  We will follow-up in 1 month to see how she is doing on her medications.      Relevant Medications   FLUoxetine (PROZAC) 10 MG tablet    Other Visit Diagnoses    Urinary tract infection without hematuria, site unspecified    -  Primary   Complete keflex, UA today more contaminated with vaginal bleeding    Relevant Medications   metroNIDAZOLE (FLAGYL) 500 MG tablet   Other Relevant Orders   Urinalysis, Routine w reflex microscopic (Completed)   Abnormal vaginal bleeding       Cultures sent, likely due to nexplanon period and being on antibiotics, recent infection, if bleeding persist > than 1 week, will add OCP   Relevant Orders   Pregnancy, urine   WET PREP  FOR TRICH, YEAST, CLUE   C. trachomatis/N. gonorrhoeae RNA   BV (bacterial vaginosis)       Flagyl sent   Relevant Medications   metroNIDAZOLE (FLAGYL) 500 MG tablet      Note: This dictation was prepared with Dragon dictation along with smaller phrase technology. Any transcriptional errors that result from this process are unintentional.

## 2018-11-03 LAB — C. TRACHOMATIS/N. GONORRHOEAE RNA
C. TRACHOMATIS RNA, TMA: NOT DETECTED
N. GONORRHOEAE RNA, TMA: NOT DETECTED

## 2018-11-16 ENCOUNTER — Ambulatory Visit (INDEPENDENT_AMBULATORY_CARE_PROVIDER_SITE_OTHER): Payer: BC Managed Care – PPO | Admitting: Gastroenterology

## 2018-11-16 ENCOUNTER — Encounter: Payer: Self-pay | Admitting: Gastroenterology

## 2018-11-16 VITALS — BP 118/76 | HR 76 | Ht 67.5 in | Wt 228.0 lb

## 2018-11-16 DIAGNOSIS — L4050 Arthropathic psoriasis, unspecified: Secondary | ICD-10-CM

## 2018-11-16 DIAGNOSIS — K50819 Crohn's disease of both small and large intestine with unspecified complications: Secondary | ICD-10-CM

## 2018-11-16 NOTE — Progress Notes (Addendum)
Referring Provider: [name], MD Primary Care Physician:  [name], MD   Reason for Consultation: Crohn's disease   IMPRESSION:  Fistulizing Crohns Disease    -Diagnosed in 2012 when she presented with abdominal pain, bloody stools, and a 60 pound weight loss    - Colon biopsies 2013 showed normal random biopsies except for focal erosion at the IC valve       - Procedure performed at [facility], procedure note not available in [place]       - Has been treated with Remicade, methotrexate, and Arava    - Off therapy for at least 3 years    - MRE 09/23/12:  severe inflammatory changes of the lower pelvis with multiple enteroenteric fistulas       - inflammatory changes of the terminal ileum, sigmoid colon, and mesentery  Juvenile idiopathic arthritis/psoriatic arthritis Depression and anxiety  Deanna [name] is seen with a history of complicated Crohn's disease off medical therapy for 3 years.  Recent symptoms are concerning for active Crohn's although it appears she may carry some diagnosis of IBS overlap. We will work to restage her disease prior to making treatment decisions.  PLAN: Fecal calprotectin MRI Enterography IBD protocol abd/pelvis Return to clinic after the Enterography Consider endoscopic evaluation after reviewing her MRI Referral to Dr. Leigh Aurora (rheumatology) Consider resuming Bentyl in the meantime She will obtain her vaccination history from her mother   HPI: Deanna [name] is a 23 y.o. female with a history of JIA/psoriatic arthritis and Crohn's disease previously on MTX and Arava.  She is referred given her history of Crohn's disease.  The history is obtained through the patient and review of her electronic health record.  The bulk of her history comes through [facility] she was previously followed at [facility].  She was last seen there in 2015.  She has not had any interval GI care since that time.  She is  unaccompanied to this visit.  Recently diagnosed with depression and anxiety.   Diagnosed with Crohn's disease in 2012 when she presented with diffuse, nonradiating abdominal pain, bloody stools, and a 60 pound weight loss.  Evaluation in 08/2012 included an EGD, colonoscopy, and abdominal MRI.  Unfortunately I'm unable to obtain any outpatient office note summarizing her care.  Although she had a colonoscopy in 2013 I am unable to find a copy of the procedure report.  I am able to see colon biopsies from a colonoscopy in 2013.  None of those biopsies showed active or chronic colitis except for focal surface epithelial erosion on the IC valve.  MR of the abdomen and pelvis with IBD protocol was performed at [facility] 09/23/2012 and shows severe inflammatory changes of the lower pelvis with multiple Antero enteric fistulas with the possibility of an enteric colonic fistula involving the sigmoid colon.  There are inflammatory changes of the terminal ileum, sigmoid colon, and mesentery consistent with severe complicated Crohn's disease. and shows she was previously treated with infliximab but self discontinued the medication in December 2014.  She has been off and on methotrexate.  Jolee Ewing was added in August 2015.  Her symptoms were well controlled on MTX and Arava.  She was also treated with Bentyl.  However she has been off medications for 3 years.  She had no significant flare in her symptoms while pregnant with her son over 2 years ago.  She has tried to control any intermittent abdominal pain with acetaminophen.  Today  she reports ongoing symptoms.  This is primarily diffuse, nonradiating abdominal pain.  There is associated diarrhea - stools are black and loose. No bright red blood or mucus. Associated fatigue. Poor appetitie.   History of arthralgias involving the bilateral wrists, knees, and ankles. Ankles are better over the last year. Using Tylenol. Knows to avoid NSAIDs.  Not seeing a rheumatologist but she  would like to establish with 1.   She is unaware of her prior vaccine history.  She does receive the seasonal flu vaccine.  She is not sure about other vaccines and would like to review this with her mother.  Vaping in high school.  Denies any ongoing alcohol, tobacco, vaping, marijuana, or street drug use.  She is currently studying social work at [employer].  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  She has a 84-year-old son.  She lives with her grandmother.   Past Medical History:  Diagnosis Date  . Anxiety   . Arthritis, 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[place])   . Crohn disease ([place])    dx in 2012  . Depression   . Infection    UTI x many    Past Surgical History:  Procedure Laterality Date  . COLONOSCOPY      Current Outpatient Medications  Medication Sig Dispense Refill  . ferrous sulfate 325 (65 FE) MG tablet Take 325 mg by mouth daily with breakfast.    . FLUoxetine (PROZAC) 10 MG tablet Take 1 tablet (10 mg total) by mouth daily. 30 tablet 3   Current Facility-Administered Medications  Medication Dose Route Frequency Provider Last Rate Last Dose  . etonogestrel (NEXPLANON) implant 68 mg  68 mg Subdermal Once Chancy Milroy, MD        Allergies as of 11/16/2018  . (No Known Allergies)    Family History  Problem Relation Age of Onset  . Diabetes Maternal Grandmother   . Gallstones Mother   . Stomach cancer Maternal Aunt   . Diabetes Maternal Grandfather   . Heart disease Maternal Grandfather   . Stroke Maternal Grandfather   . Colon cancer Maternal Grandfather     Social History   Socioeconomic History  . Marital status: Significant Other    Spouse name: Not on file  . Number of children: 1  . Years of education: Not on file  . Highest education level: Not on file  Occupational History  . Occupation: Ship broker  Social Needs  . Financial resource strain: Not on file  . Food insecurity:    Worry: Not on file    Inability: Not on file  . Transportation needs:      Medical: Not on file    Non-medical: Not on file  Tobacco Use  . Smoking status: Former Smoker    Types: E-cigarettes  . Smokeless tobacco: Never Used  . Tobacco comment: vaping in high school  Substance and Sexual Activity  . Alcohol use: No  . Drug use: No  . Sexual activity: Yes  Lifestyle  . Physical activity:    Days per week: Not on file    Minutes per session: Not on file  . Stress: Not on file  Relationships  . Social connections:    Talks on phone: Not on file    Gets together: Not on file    Attends religious service: Not on file    Active member of club or organization: Not on file    Attends meetings of clubs or organizations: Not on file  Relationship status: Not on file  . Intimate partner violence:    Fear of current or ex partner: Not on file    Emotionally abused: Not on file    Physically abused: Not on file    Forced sexual activity: Not on file  Other Topics Concern  . Not on file  Social History Narrative  . Not on file    Review of Systems: 12 system ROS is negative except as noted above with the additions of anxiety, back pain, cough, depression, fatigue, headaches, insomnia.  The patient has no eye or vision complaints.  She has no urinary symptoms.Danley Danker Weights   11/16/18 1519  Weight: 228 lb (103.4 kg)    Physical Exam: Vital signs were reviewed. General:   Alert, well-nourished, pleasant and cooperative in NAD Head:  Normocephalic and atraumatic. Eyes:  Sclera clear, no icterus.   Conjunctiva pink. Mouth:  No deformity or lesions.   Neck:  Supple; no thyromegaly. Lungs:  Clear throughout to auscultation.   No wheezes.  Heart:  Regular rate and rhythm; no murmurs Abdomen:  Soft, nontender, normal bowel sounds. No rebound or guarding. No hepatosplenomegaly Rectal:  Deferred  Msk:  Symmetrical without gross deformities. Extremities:  No gross deformities or edema. Neurologic:  Alert and  oriented x4;  grossly nonfocal Skin:  No  rash or bruise. Psych:  Alert and cooperative. Normal mood and affect.   Bravery Ketcham L. Tarri Glenn, MD, MPH [facility] 11/17/2018, 12:44 PM

## 2018-11-16 NOTE — Patient Instructions (Addendum)
If you are age 23 or younger, your body mass index should be between 19-25. Your Body mass index is 35.18 kg/m. If this is out of the aformentioned range listed, please consider follow up with your Primary Care Provider.   You have been scheduled for an MRI Enterography at [facility] on 11/22/2018.Your appointment time is 3:00pm. Please arrive 1.5 hours(1:30pm)   prior to your appointment time for registration purposes/ and prep. Please make certain not to have anything to eat or drink 6 hours prior to your test. In addition, if you have any metal in your body, have a pacemaker or defibrillator, please be sure to let your ordering physician know. This test typically takes 45 minutes to 1 hour to complete. Should you need to reschedule, please call [phone] to do so.   We will send referral to Dr. Leigh Aurora office. They will contact you with appointment.   Your provider has requested that you go to the basement level for lab work before leaving today. Press "B" on the elevator. The lab is located at the first door on the left as you exit the elevator.  Thank you for choosing me and [facility].  Dr. Tarri Glenn

## 2018-11-17 ENCOUNTER — Encounter: Payer: Self-pay | Admitting: Gastroenterology

## 2018-11-18 ENCOUNTER — Encounter: Payer: Self-pay | Admitting: Family Medicine

## 2018-11-18 ENCOUNTER — Ambulatory Visit (INDEPENDENT_AMBULATORY_CARE_PROVIDER_SITE_OTHER): Payer: BC Managed Care – PPO | Admitting: Family Medicine

## 2018-11-18 VITALS — BP 120/80 | HR 93 | Temp 98.5°F | Ht 67.0 in | Wt 230.0 lb

## 2018-11-18 DIAGNOSIS — R112 Nausea with vomiting, unspecified: Secondary | ICD-10-CM | POA: Diagnosis not present

## 2018-11-18 DIAGNOSIS — J069 Acute upper respiratory infection, unspecified: Secondary | ICD-10-CM

## 2018-11-18 DIAGNOSIS — R197 Diarrhea, unspecified: Secondary | ICD-10-CM

## 2018-11-18 DIAGNOSIS — R059 Cough, unspecified: Secondary | ICD-10-CM

## 2018-11-18 DIAGNOSIS — R05 Cough: Secondary | ICD-10-CM | POA: Diagnosis not present

## 2018-11-18 MED ORDER — IPRATROPIUM-ALBUTEROL 0.5-2.5 (3) MG/3ML IN SOLN
3.0000 mL | Freq: Once | RESPIRATORY_TRACT | Status: AC
  Administered 2018-11-18: 3 mL via RESPIRATORY_TRACT

## 2018-11-18 MED ORDER — ALBUTEROL SULFATE HFA 108 (90 BASE) MCG/ACT IN AERS: 2.0000 | INHALATION_SPRAY | RESPIRATORY_TRACT | 0 refills | Status: DC | PRN

## 2018-11-18 MED ORDER — PREDNISONE 20 MG PO TABS: 40.0000 mg | ORAL_TABLET | Freq: Every day | ORAL | 0 refills | Status: AC

## 2018-11-18 MED ORDER — BENZONATATE 100 MG PO CAPS: 100.0000 mg | ORAL_CAPSULE | Freq: Three times a day (TID) | ORAL | 0 refills | Status: DC | PRN

## 2018-11-18 MED ORDER — PREDNISONE 10 MG PO TABS: ORAL_TABLET | ORAL | 0 refills | Status: DC

## 2018-11-18 MED ORDER — GUAIFENESIN ER 600 MG PO TB12: 600.0000 mg | ORAL_TABLET | Freq: Two times a day (BID) | ORAL | 0 refills | Status: AC

## 2018-11-18 MED ORDER — ONDANSETRON HCL 4 MG PO TABS: 4.0000 mg | ORAL_TABLET | Freq: Three times a day (TID) | ORAL | 0 refills | Status: DC | PRN

## 2018-11-18 NOTE — Progress Notes (Signed)
Patient ID: Deanna [name], female    DOB: 02-Dec-1995, 23 y.o.   MRN: [id number]  PCP: [name], MD  Chief Complaint  Patient presents with  . Sore Throat    Patient in with c/o sore throat, cough, diarrhea, vomiting, and fatigue. Onset 1 week ago    Subjective:   Deanna [name] is a 23 y.o. female, presents to clinic with CC of one week of illness including sore throat, productive cough with SOB, fatigue, malaise, N/V/D.  She reports hx of her son being ill with flu-like sx, he just started to feel better when she began to get sick.  She only endorses hx of bronchitis, but no hx of asthma or other lung disease.  She denies fever, sweats, chills. Coughing is very frequent multiple times a minute, worse if trying to speak or take a deep breath.  She has associated chest tightness, sore ribs especially around her right and left lower side to back, short of breath and weak when trying to do minor activities, she also describes associated burning and raw sensation in her upper chest to the back of her throat. Sputum that she is able to produce is stringy and white she feels like it is very thick in the back of her neck and throat She is having some nausea and upper epigastric upset she believes it is related to taking iron that she recently started taking for anemia. She has had some posttussive emesis, no other vomiting episodes, but also does feel very nauseous Yesterday she had some darker stools, today her stool was watery brown without any melena or hematochezia, she denies any mucus or blood  Recent OV reviewed from 10/26/2018 and 11/02/2018 with Dr. Buelah Manis PCP Reviewed 11/16/2018 OV with GI  Patient Active Problem List   Diagnosis Date Noted  . Depression with anxiety 10/27/2018  . Nexplanon in place 07/07/2018  . Exposure to STD 07/07/2018  . Keloid 09/12/2013  . Acne vulgaris 09/12/2013  . 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[place])  09/30/2012  . 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[place]) 08/09/2012     Prior to Admission medications   Medication Sig Start Date End Date Taking? Authorizing Provider  ferrous sulfate 325 (65 FE) MG tablet Take 325 mg by mouth daily with breakfast.   Yes [provider]  FLUoxetine (PROZAC) 10 MG tablet Take 1 tablet (10 mg total) by mouth daily. 11/02/18  Yes [place], Modena Nunnery, MD     No Known Allergies   Family History  Problem Relation Age of Onset  . Diabetes Maternal Grandmother   . Gallstones Mother   . Stomach cancer Maternal Aunt   . Diabetes Maternal Grandfather   . Heart disease Maternal Grandfather   . Stroke Maternal Grandfather   . Colon cancer Maternal Grandfather      Social History   Socioeconomic History  . Marital status: Significant Other    Spouse name: Not on file  . Number of children: 1  . Years of education: Not on file  . Highest education level: Not on file  Occupational History  . Occupation: Ship broker  Social Needs  . Financial resource strain: Not on file  . Food insecurity:    Worry: Not on file    Inability: Not on file  . Transportation needs:    Medical: Not on file    Non-medical: Not on file  Tobacco Use  . Smoking status: Former Smoker  Types: E-cigarettes  . Smokeless tobacco: Never Used  . Tobacco comment: vaping in high school  Substance and Sexual Activity  . Alcohol use: No  . Drug use: No  . Sexual activity: Yes  Lifestyle  . Physical activity:    Days per week: Not on file    Minutes per session: Not on file  . Stress: Not on file  Relationships  . Social connections:    Talks on phone: Not on file    Gets together: Not on file    Attends religious service: Not on file    Active member of club or organization: Not on file    Attends meetings of clubs or organizations: Not on file    Relationship status: Not on file  . Intimate partner violence:    Fear of current or ex partner: Not on file     Emotionally abused: Not on file    Physically abused: Not on file    Forced sexual activity: Not on file  Other Topics Concern  . Not on file  Social History Narrative  . Not on file     Review of Systems     Objective:    Vitals:   11/18/18 1158  BP: 120/80  Pulse: 78  Temp: 98.5 F (36.9 C)  TempSrc: Oral  SpO2: 98%  Weight: 230 lb (104.3 kg)  Height: 5' 7"  (1.702 m)      Physical Exam Vitals signs and nursing note reviewed.  Constitutional:      General: She is not in acute distress.    Appearance: Normal appearance. She is well-developed. She is obese. She is ill-appearing. She is not toxic-appearing or diaphoretic.  HENT:     Head: Normocephalic and atraumatic.     Right Ear: Tympanic membrane and external ear normal.     Left Ear: Tympanic membrane and external ear normal.     Nose: Congestion and rhinorrhea present. Rhinorrhea is clear.     Right Sinus: No maxillary sinus tenderness or frontal sinus tenderness.     Left Sinus: No maxillary sinus tenderness or frontal sinus tenderness.     Mouth/Throat:     Mouth: Mucous membranes are moist.     Tongue: No lesions.     Pharynx: Uvula midline. Posterior oropharyngeal erythema (very mild) present. No oropharyngeal exudate.     Tonsils: No tonsillar exudate.  Eyes:     General: Lids are normal.     Conjunctiva/sclera: Conjunctivae normal.     Pupils: Pupils are equal, round, and reactive to light.  Neck:     Musculoskeletal: Normal range of motion and neck supple.     Trachea: Phonation normal. No tracheal deviation.  Cardiovascular:     Rate and Rhythm: Normal rate and regular rhythm.     Pulses: Normal pulses.          Radial pulses are 2+ on the right side and 2+ on the left side.       Posterior tibial pulses are 2+ on the right side and 2+ on the left side.     Heart sounds: Normal heart sounds. No murmur. No friction rub. No gallop.   Pulmonary:     Effort: Tachypnea present. No accessory muscle  usage or respiratory distress.     Breath sounds: No stridor. Examination of the right-middle field reveals decreased breath sounds and rhonchi. Examination of the left-middle field reveals rhonchi. Examination of the right-lower field reveals decreased breath sounds and rhonchi. Examination of  the left-lower field reveals decreased breath sounds and rhonchi. Decreased breath sounds, wheezing and rhonchi present.     Comments: Initially coughing so frequently she was only able to speak few words at a time and she had splinted inspiratory effort due to coughing fits  Pt reexamined after breathing tx, able to speak in short sentences and able to take deeper breath, RLL field with persisting diminished BS when compared to the LLL Chest:     Chest wall: No tenderness.  Abdominal:     General: Bowel sounds are normal. There is no distension.     Palpations: Abdomen is soft.     Tenderness: There is abdominal tenderness (mild epigastric ttp, no guarding to rebound). There is no right CVA tenderness, left CVA tenderness, guarding or rebound.  Musculoskeletal: Normal range of motion.        General: No deformity.  Lymphadenopathy:     Cervical: Cervical adenopathy present.  Skin:    General: Skin is warm and dry.     Capillary Refill: Capillary refill takes less than 2 seconds.     Coloration: Skin is not ashen or pale.     Findings: No rash.     Nails: There is no clubbing.   Neurological:     Mental Status: She is alert and oriented to person, place, and time.     Motor: No abnormal muscle tone.     Gait: Gait normal.  Psychiatric:        Speech: Speech normal.        Behavior: Behavior normal.            Assessment & Plan:   23 y/o AAF with PMHx of crohns, juvenile idiopathic arthritis, depression, anemia and recent UTI and vaginitis x 2 presents here with URI, GI sx, abdominal pain - most severe is her obviously severe cough with SOB.  All sx developed over the past week and are  worsening, her son was recently ill with similar sx and he improved about one week ago.    ICD-10-CM   1. Cough R05 ipratropium-albuterol (DUONEB) 0.5-2.5 (3) MG/3ML nebulizer solution 3 mL    DG Chest 2 View    predniSONE (DELTASONE) 20 MG tablet    albuterol (PROVENTIL HFA;VENTOLIN HFA) 108 (90 Base) MCG/ACT inhaler    benzonatate (TESSALON) 100 MG capsule    guaiFENesin (MUCINEX) 600 MG 12 hr tablet    predniSONE (DELTASONE) 10 MG tablet   viral bronchitis vs pneumonia - CXR pending, duoneb in clinic helped with BS, coughing and sx of SOB/chest tightness  2. Upper respiratory tract infection, unspecified type J06.9 Influenza A and B Ag, Immunoassay    STREP GROUP A AG, W/REFLEX TO CULT    Culture, Group A Strep   flu and strep negative - suspect viral syndrome  3. Nausea vomiting and diarrhea R11.2 ondansetron (ZOFRAN) 4 MG tablet   R19.7    likely polyfactorial - iron, crohns-recently worse sx, recent abx, viral illness     With cough - RLL fields coarse and diminished BS, remained diminished after duoneb, concern for possible pneumonia - CXR to eval, and do not want to give more abx unnecessarily in the setting of N/V/D If CXR neg - tx for acute bronchitis with above plan  N/V/D: tx with zofran for nausea - some of it may be GI illness and post-tussive  pt was holding iron due to stomach upset, encouraged her to do every other day dosing when she feels a  little better, and communicate with GI if she is unable to tolerate oral iron supplementation.  If possible encouraged to return to daily use  Crohns - in giving prednisone for her SOB/wheeze/cough - I am concerned about how burst may affect crohns, so there was a second Rx sent through of 10 mg prednisone to taper if she has SE when stopping prednisone burst - again encouraged pt to discuss with GI, because it may affect their management/results, but she was so SOB and lungs so tight, I feel she really needs it today.  Delsa Grana,  PA-C 11/18/18 12:15 PM

## 2018-11-20 LAB — CULTURE, GROUP A STREP
MICRO NUMBER:: 256325
SPECIMEN QUALITY: ADEQUATE

## 2018-11-20 LAB — INFLUENZA A AND B AG, IMMUNOASSAY
INFLUENZA A ANTIGEN: NOT DETECTED
INFLUENZA B ANTIGEN: NOT DETECTED

## 2018-11-20 LAB — STREP GROUP A AG, W/REFLEX TO CULT: Streptococcus, Group A Screen (Direct): NOT DETECTED

## 2018-11-22 ENCOUNTER — Ambulatory Visit (HOSPITAL_COMMUNITY)
Admission: RE | Admit: 2018-11-22 | Discharge: 2018-11-22 | Disposition: A | Discharge status: Home / Self Care | Payer: BC Managed Care – PPO | Source: Ambulatory Visit | Attending: Gastroenterology | Admitting: Gastroenterology

## 2018-11-22 DIAGNOSIS — K50819 Crohn's disease of both small and large intestine with unspecified complications: Secondary | ICD-10-CM | POA: Insufficient documentation

## 2018-11-22 DIAGNOSIS — K5090 Crohn's disease, unspecified, without complications: Secondary | ICD-10-CM | POA: Diagnosis not present

## 2018-11-22 MED ORDER — GADOBUTROL 1 MMOL/ML IV SOLN
10.0000 mL | Freq: Once | INTRAVENOUS | Status: AC | PRN
  Administered 2018-11-22: 10 mL via INTRAVENOUS

## 2018-11-23 ENCOUNTER — Other Ambulatory Visit: Payer: Self-pay

## 2018-11-23 ENCOUNTER — Encounter: Payer: Self-pay | Admitting: Family Medicine

## 2018-11-25 ENCOUNTER — Other Ambulatory Visit: Payer: Self-pay

## 2018-11-25 DIAGNOSIS — K50119 Crohn's disease of large intestine with unspecified complications: Secondary | ICD-10-CM

## 2018-12-01 ENCOUNTER — Ambulatory Visit (HOSPITAL_COMMUNITY): Admission: RE | Admit: 2018-12-01 | Payer: BC Managed Care – PPO | Source: Ambulatory Visit

## 2018-12-05 ENCOUNTER — Encounter: Payer: Self-pay | Admitting: Family Medicine

## 2018-12-05 ENCOUNTER — Ambulatory Visit (INDEPENDENT_AMBULATORY_CARE_PROVIDER_SITE_OTHER): Payer: BC Managed Care – PPO | Admitting: Family Medicine

## 2018-12-05 ENCOUNTER — Other Ambulatory Visit: Payer: Self-pay

## 2018-12-05 VITALS — BP 142/66 | HR 78 | Ht 67.0 in | Wt 231.5 lb

## 2018-12-05 DIAGNOSIS — Z3046 Encounter for surveillance of implantable subdermal contraceptive: Secondary | ICD-10-CM

## 2018-12-05 NOTE — Progress Notes (Signed)
   GYNECOLOGY OFFICE VISIT NOTE History:  23 y.o. G2P1011 here today for Nexplanon removal. She denies any abnormal vaginal discharge, bleeding, pelvic pain or other concerns. Would like to have Nexplanon removed because planning another pregnancy. Started taking prenatal vitamins. Has PCP to follow-up elevated blood pressure, denies headaches, vision changes.   The following portions of the patient's history were reviewed and updated as appropriate: allergies, current medications, past family history, past medical history, past social history, past surgical history and problem list.   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   Review of Systems:  Pertinent items noted in HPI ROS  Objective:  Physical Exam BP (!) 142/66   Pulse 78   Ht 5' 7"  (1.702 m)   Wt 231 lb 8 oz (105 kg)   BMI 36.26 kg/m  Physical Exam  Constitutional: She is oriented to person, place, and time. She appears well-developed and well-nourished. No distress.  HENT:  Head: Normocephalic and atraumatic.  Eyes: Conjunctivae and EOM are normal.  Cardiovascular: Normal rate.  Pulmonary/Chest: Effort normal. No respiratory distress.  Musculoskeletal:        General: No edema.     Comments: Nexplanon palpated in LUE  Neurological: She is alert and oriented to person, place, and time.  Psychiatric: She has a normal mood and affect. Her behavior is normal.  Nursing note and vitals reviewed.  Assessment & Plan:  60OK [id number] who presents for Nexplanon removal as desires conception. Advised prenatal vitamins.   Nexplanon Removal Patient identified, informed consent performed, consent signed.   Appropriate time out taken. Nexplanon site identified.  Area prepped in usual sterile fashon. Two ml of 1% lidocaine was used to anesthetize the area at the distal end of the implant. A small stab incision was made right beside the implant on the distal portion.  The Nexplanon rod was grasped using hemostats and  removed without difficulty.  There was minimal blood loss. There were no complications.  3 ml of 1% lidocaine was injected around the incision for post-procedure analgesia.  Steri-strips were applied over the small incision.  A pressure bandage was applied to reduce any bruising.  The patient tolerated the procedure well and was given post procedure instructions.  Patient is planning for attempt conception.  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 Sent message to clinical team to schedule.   Lambert Mody. Juleen China, DO OB Family Medicine Fellow, [facility] [employer], [place]

## 2018-12-08 ENCOUNTER — Other Ambulatory Visit: Payer: Self-pay | Admitting: Family Medicine

## 2018-12-08 ENCOUNTER — Encounter: Payer: Self-pay | Admitting: Family Medicine

## 2018-12-08 ENCOUNTER — Ambulatory Visit (HOSPITAL_COMMUNITY): Admission: RE | Admit: 2018-12-08 | Payer: BC Managed Care – PPO | Source: Ambulatory Visit

## 2018-12-08 DIAGNOSIS — R197 Diarrhea, unspecified: Principal | ICD-10-CM

## 2018-12-08 DIAGNOSIS — R112 Nausea with vomiting, unspecified: Secondary | ICD-10-CM

## 2018-12-15 ENCOUNTER — Telehealth (INDEPENDENT_AMBULATORY_CARE_PROVIDER_SITE_OTHER): Payer: Self-pay | Admitting: Gastroenterology

## 2018-12-15 ENCOUNTER — Other Ambulatory Visit: Payer: Self-pay

## 2018-12-15 NOTE — Progress Notes (Signed)
Encounter initially started. However, unable to connect with patient by WebEx as scheduled. Also unavailable by phone. Voicemail was full and I could not leave a message.

## 2018-12-16 ENCOUNTER — Encounter: Payer: Self-pay | Admitting: *Deleted

## 2018-12-21 ENCOUNTER — Telehealth: Payer: Self-pay | Admitting: Emergency Medicine

## 2018-12-21 DIAGNOSIS — K50119 Crohn's disease of large intestine with unspecified complications: Secondary | ICD-10-CM

## 2018-12-21 NOTE — Telephone Encounter (Signed)
Spoke with patient she states she still has the container and just forgot to do it. She will try to come by the lab this week and have the other labs done as well. She will try to obtain her immunization history.

## 2018-12-21 NOTE — Telephone Encounter (Signed)
-----   Message from [place], MD sent at 12/15/2018 10:41 AM EDT ----- In reviewing this patient's records for her scheduled appointment today, I realized that she did not have her fecal calprotectin.  I would like her to have this done as well as labs needed prior to considering biologic therapy: Hepatitis B surface antigen (HBsAg), hepatitis B surface antibody (HBsAb), and hepatitis B core antibody (HBcAb) and  QuantiFERON-TB Gold.  I had previously asked her for a list of all of her prior vaccines received. It will be even more important that she obtain this information from her Mom. Thanks.  KLB

## 2019-01-05 ENCOUNTER — Ambulatory Visit (HOSPITAL_COMMUNITY): Payer: BC Managed Care – PPO

## 2019-01-09 ENCOUNTER — Ambulatory Visit: Payer: Self-pay | Admitting: Obstetrics and Gynecology

## 2019-01-31 ENCOUNTER — Ambulatory Visit (INDEPENDENT_AMBULATORY_CARE_PROVIDER_SITE_OTHER): Payer: BC Managed Care – PPO | Admitting: General Practice

## 2019-01-31 ENCOUNTER — Other Ambulatory Visit: Payer: Self-pay

## 2019-01-31 DIAGNOSIS — Z3201 Encounter for pregnancy test, result positive: Secondary | ICD-10-CM

## 2019-01-31 DIAGNOSIS — Z3687 Encounter for antenatal screening for uncertain dates: Secondary | ICD-10-CM

## 2019-01-31 LAB — POCT PREGNANCY, URINE: Preg Test, Ur: POSITIVE — AB

## 2019-01-31 NOTE — Progress Notes (Signed)
Patient presents to office today for UPT. UPT +. Patient reports first positive home test on Saturday. LMP 12/05/18 EDD 09/11/19 [redacted]w[redacted]d Patient reports having Nexplanon removed in March- that was her first and only period after removal. Patient reports prior negative pregnancy tests in April. Will schedule ultrasound for dating confirmation. Scheduled 5/26 @ 8am and informed patient. Patient verbalized understanding. Patient reports taking prozac and prenatal vitamins- per KMaye Hides prozac is fine in 1st trimester. Discussed with patient. Patient verbalized understanding & had no questions.  CKoren BoundRN BSN 01/31/19

## 2019-02-01 NOTE — Progress Notes (Signed)
-  Given risks of untreated anxiety and depression, advised patient to continue on antidepressants Chart reviewed for nurse visit. Agree with plan of care.   [name], [place] 02/01/2019 12:45 PM

## 2019-02-06 ENCOUNTER — Telehealth (INDEPENDENT_AMBULATORY_CARE_PROVIDER_SITE_OTHER): Payer: BC Managed Care – PPO | Admitting: *Deleted

## 2019-02-06 ENCOUNTER — Other Ambulatory Visit: Payer: Self-pay

## 2019-02-06 DIAGNOSIS — O0990 Supervision of high risk pregnancy, unspecified, unspecified trimester: Secondary | ICD-10-CM | POA: Insufficient documentation

## 2019-02-06 DIAGNOSIS — Z3490 Encounter for supervision of normal pregnancy, unspecified, unspecified trimester: Secondary | ICD-10-CM

## 2019-02-06 MED ORDER — AMBULATORY NON FORMULARY MEDICATION: 1.0000 | 0 refills | Status: DC

## 2019-02-06 NOTE — Progress Notes (Signed)
I connected with  Deanna [name] on 02/06/19 at  9:15 AM EDT by telephone and MyChart and verified that I am speaking with the correct person using two identifiers.   I discussed the limitations, risks, security and privacy concerns of performing an evaluation and management service by telephone and the availability of in person appointments. I also discussed with the patient that there may be a patient responsible charge related to this service. The patient expressed understanding and agreed to proceed.  Dolores Hoose, RN 02/06/2019  9:34 AM   Pt signed up for babyscripts optimization.  Order placed for BP cuff and request sent via front office staff to fax prescription to pharmacy. Pt instructed to bring BP cuff to first OB appointment.  Pt verbalized understanding. Pt states she knows when her LMP was but is unsure on dating d/t that being her only period s/p Nexplanon removal.  Pt has ultrasound for dating scheduled for 5/28.  Pt aware.

## 2019-02-06 NOTE — Progress Notes (Signed)
Agree with A & P

## 2019-02-14 ENCOUNTER — Ambulatory Visit (HOSPITAL_COMMUNITY): Payer: BC Managed Care – PPO

## 2019-02-16 ENCOUNTER — Ambulatory Visit (HOSPITAL_COMMUNITY)
Admission: RE | Admit: 2019-02-16 | Discharge: 2019-02-16 | Disposition: A | Discharge status: Home / Self Care | Payer: BC Managed Care – PPO | Source: Ambulatory Visit | Attending: Student | Admitting: Student

## 2019-02-16 ENCOUNTER — Other Ambulatory Visit: Payer: Self-pay

## 2019-02-16 DIAGNOSIS — Z3687 Encounter for antenatal screening for uncertain dates: Secondary | ICD-10-CM | POA: Diagnosis not present

## 2019-02-16 DIAGNOSIS — Z3A01 Less than 8 weeks gestation of pregnancy: Secondary | ICD-10-CM | POA: Diagnosis not present

## 2019-02-16 DIAGNOSIS — Z3689 Encounter for other specified antenatal screening: Secondary | ICD-10-CM | POA: Diagnosis not present

## 2019-02-17 ENCOUNTER — Encounter: Payer: BC Managed Care – PPO | Admitting: Obstetrics and Gynecology

## 2019-02-17 ENCOUNTER — Telehealth: Payer: Self-pay | Admitting: Obstetrics and Gynecology

## 2019-02-17 NOTE — Telephone Encounter (Signed)
Attempted to call patient with her rescheduled New ob that she missed on 5/29 ( new appt 6/4 @ 2:55) No answer, left voicemail with appointment information as well as face mask and visitor restrictions. Reminder letter mailed

## 2019-02-23 ENCOUNTER — Encounter: Payer: BC Managed Care – PPO | Admitting: Obstetrics and Gynecology

## 2019-02-23 ENCOUNTER — Other Ambulatory Visit: Payer: Self-pay

## 2019-03-06 ENCOUNTER — Emergency Department (HOSPITAL_COMMUNITY): Payer: BC Managed Care – PPO

## 2019-03-06 ENCOUNTER — Encounter (HOSPITAL_COMMUNITY): Payer: Self-pay

## 2019-03-06 ENCOUNTER — Emergency Department (HOSPITAL_COMMUNITY)
Admission: EM | Admit: 2019-03-06 | Discharge: 2019-03-06 | Disposition: A | Discharge status: Home / Self Care | Payer: BC Managed Care – PPO | Attending: Emergency Medicine | Admitting: Emergency Medicine

## 2019-03-06 ENCOUNTER — Other Ambulatory Visit: Payer: Self-pay

## 2019-03-06 DIAGNOSIS — Z87891 Personal history of nicotine dependence: Secondary | ICD-10-CM | POA: Diagnosis not present

## 2019-03-06 DIAGNOSIS — Z3A09 9 weeks gestation of pregnancy: Secondary | ICD-10-CM | POA: Diagnosis not present

## 2019-03-06 DIAGNOSIS — O209 Hemorrhage in early pregnancy, unspecified: Secondary | ICD-10-CM | POA: Diagnosis present

## 2019-03-06 DIAGNOSIS — O208 Other hemorrhage in early pregnancy: Secondary | ICD-10-CM | POA: Diagnosis not present

## 2019-03-06 DIAGNOSIS — O200 Threatened abortion: Secondary | ICD-10-CM | POA: Insufficient documentation

## 2019-03-06 DIAGNOSIS — Z79899 Other long term (current) drug therapy: Secondary | ICD-10-CM | POA: Insufficient documentation

## 2019-03-06 LAB — POC URINE PREG, ED: Preg Test, Ur: POSITIVE — AB

## 2019-03-06 LAB — CBC
HCT: 39.6 % (ref 36.0–46.0)
Hemoglobin: 12.6 g/dL (ref 12.0–15.0)
MCH: 29 pg (ref 26.0–34.0)
MCHC: 31.8 g/dL (ref 30.0–36.0)
MCV: 91 fL (ref 80.0–100.0)
Platelets: 223 10*3/uL (ref 150–400)
RBC: 4.35 MIL/uL (ref 3.87–5.11)
RDW: 13.3 % (ref 11.5–15.5)
WBC: 6.7 10*3/uL (ref 4.0–10.5)
nRBC: 0 % (ref 0.0–0.2)

## 2019-03-06 LAB — BASIC METABOLIC PANEL
Anion gap: 7 (ref 5–15)
BUN: 10 mg/dL (ref 6–20)
CO2: 22 mmol/L (ref 22–32)
Calcium: 8.8 mg/dL — ABNORMAL LOW (ref 8.9–10.3)
Chloride: 107 mmol/L (ref 98–111)
Creatinine, Ser: 0.49 mg/dL (ref 0.44–1.00)
GFR calc Af Amer: >60 mL/min (ref >60)
GFR calc non Af Amer: >60 mL/min (ref >60)
Glucose, Bld: 80 mg/dL (ref 70–99)
Potassium: 4 mmol/L (ref 3.5–5.1)
Sodium: 136 mmol/L (ref 135–145)

## 2019-03-06 LAB — HCG, QUANTITATIVE, PREGNANCY: hCG, Beta Chain, Quant, S: 56278 m[IU]/mL — ABNORMAL HIGH (ref <5)

## 2019-03-06 NOTE — ED Notes (Signed)
ED provider at bedside.

## 2019-03-06 NOTE — Discharge Instructions (Addendum)
Rest, avoid any strenuous activity.  Follow-up with your OB/GYN doctor.  Call to schedule appointment.  His review the discharge instructions for additional information

## 2019-03-06 NOTE — ED Triage Notes (Addendum)
Patient states she had some heavy bleeding this morning with blood clots, dark red. Patient was also cramping yesterday.   4/10 mild cramping   Patient states she is 9 weeks and 5 days pregnant.    A/ox4 Ambulatory in triage.

## 2019-03-06 NOTE — ED Provider Notes (Signed)
[facility] DEPT Provider Note   CSN: [id number] Arrival date & time: 03/06/19  1148    History   Chief Complaint Chief Complaint  Patient presents with  . Vaginal Bleeding    9 weeks preg    HPI Deanna [name] is a 23 y.o. female.     HPI Pt is g3p1 [redacted] weeks ega.  This morning she had heavy cramping and bleeding.  The sx have since eased off.  Pt called her PCP who suggested she come to the ED.  Pt denies fevers.  She had some morning sickness this am but thought this was typical.  No diarrhea.  Past Medical History:  Diagnosis Date  . Anxiety   . 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 Crohn disease ([employer])    dx in 2012  . Depression   . Infection    UTI x many    Patient Active Problem List   Diagnosis Date Noted  . Supervision of low-risk pregnancy 02/06/2019  . Depression with anxiety 10/27/2018  . Exposure to STD 07/07/2018  . Keloid 09/12/2013  . Acne vulgaris 09/12/2013  . 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[place]) 09/30/2012  . 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[place]) 08/09/2012    Past Surgical History:  Procedure Laterality Date  . COLONOSCOPY       OB History    Gravida  3   Para  1   Term  1   Preterm  0   AB  1   Living  1     SAB  0   TAB  1   Ectopic  0   Multiple  0   Live Births  1            Home Medications    Prior to Admission medications   Medication Sig Start Date End Date Taking? Authorizing Provider  acetaminophen (TYLENOL) 500 MG tablet Take 500 mg by mouth every 6 (six) hours as needed.   Yes [provider]  AMBULATORY NON FORMULARY MEDICATION 1 Device by Other route once a week. Blood Pressure Cuff Medium Monitored Regularly at home ICD 10:Z34.90 02/06/19  Yes Chancy Milroy, MD  FLUoxetine (PROZAC) 10 MG tablet Take 1 tablet (10 mg total) by mouth daily. 11/02/18  Yes [place], Modena Nunnery, MD  Prenatal Vit-Fe Fumarate-FA (PRENATAL PO) Take  by mouth.   Yes [provider]    Family History Family History  Problem Relation Age of Onset  . Diabetes Maternal Grandmother   . Gallstones Mother   . Anxiety disorder Mother   . Depression Mother   . Stomach cancer Maternal Aunt   . Diabetes Maternal Grandfather   . Heart disease Maternal Grandfather   . Stroke Maternal Grandfather   . Colon cancer Maternal Grandfather     Social History Social History   Tobacco Use  . Smoking status: Former Smoker    Types: E-cigarettes, Cigarettes  . Smokeless tobacco: Never Used  . Tobacco comment: vaping in high school  Substance Use Topics  . Alcohol use: No  . Drug use: No     Allergies   Patient has no known allergies.   Review of Systems Review of Systems  All other systems reviewed and are negative.    Physical Exam Updated Vital Signs BP 129/81   Pulse 77   Temp 98.9 F (37.2 C) (Oral)   Resp 16   LMP 12/05/2018 (Exact Date)   SpO2  100%   Physical Exam Vitals signs and nursing note reviewed.  Constitutional:      General: She is not in acute distress.    Appearance: She is well-developed.  HENT:     Head: Normocephalic and atraumatic.     Right Ear: External ear normal.     Left Ear: External ear normal.  Eyes:     General: No scleral icterus.       Right eye: No discharge.        Left eye: No discharge.     Conjunctiva/sclera: Conjunctivae normal.  Neck:     Musculoskeletal: Neck supple.     Trachea: No tracheal deviation.  Cardiovascular:     Rate and Rhythm: Normal rate and regular rhythm.  Pulmonary:     Effort: Pulmonary effort is normal. No respiratory distress.     Breath sounds: Normal breath sounds. No stridor. No wheezing or rales.  Abdominal:     General: Bowel sounds are normal. There is no distension.     Palpations: Abdomen is soft.     Tenderness: There is no abdominal tenderness. There is no guarding or rebound.  Genitourinary:    Comments: Small amount of blood  noted in the vaginal vault, no clots, no active bleeding, cervical osseous closed uterus is gravid, no adnexal mass Musculoskeletal:        General: No tenderness.  Skin:    General: Skin is warm and dry.     Findings: No rash.  Neurological:     Mental Status: She is alert.     Cranial Nerves: No cranial nerve deficit (no facial droop, extraocular movements intact, no slurred speech).     Sensory: No sensory deficit.     Motor: No abnormal muscle tone or seizure activity.     Coordination: Coordination normal.      ED Treatments / Results  Labs (all labs ordered are listed, but only abnormal results are displayed) Labs Reviewed  BASIC METABOLIC PANEL - Abnormal; Notable for the following components:      Result Value   Calcium 8.8 (*)    All other components within normal limits  HCG, QUANTITATIVE, PREGNANCY - Abnormal; Notable for the following components:   hCG, Beta Chain, Quant, S 56,278 (*)    All other components within normal limits  POC URINE PREG, ED - Abnormal; Notable for the following components:   Preg Test, Ur POSITIVE (*)    All other components within normal limits  CBC    EKG None  Radiology US Ob Comp < 14 Wks  Result Date: 03/06/2019 CLINICAL DATA:  Vaginal bleeding EXAM: OBSTETRIC <14 WK [place] AND TRANSVAGINAL OB US TECHNIQUE: Both transabdominal and transvaginal ultrasound examinations were performed for complete evaluation of the gestation as well as the maternal uterus, adnexal regions, and pelvic cul-de-sac. Transvaginal technique was performed to assess early pregnancy. COMPARISON:  02/16/2019 FINDINGS: Intrauterine gestational sac: Single Yolk sac:  Not Visualized. Embryo:  Visualized. Cardiac Activity: Visualized. Heart Rate: 162 bpm CRL:  26.2 mm   9 w   3 d                  [place] EDC: 10/06/2019 Subchorionic hemorrhage: There is a small amount of subchorionic hemorrhage. Maternal uterus/adnexae: No maternal abnormality detected. The ovaries are  unremarkable. IMPRESSION: Single live IUP at 9 weeks and 3 days. There is a small amount of subchorionic hemorrhage. Electronically Signed   By: Constance Holster M.D.   On: 03/06/2019  18:58    Procedures Procedures (including critical care time)  Medications Ordered in ED Medications - No data to display   Initial Impression / Assessment and Plan / ED Course  I have reviewed the triage vital signs and the nursing notes.  Pertinent labs & imaging results that were available during my care of the patient were reviewed by me and considered in my medical decision making (see chart for details).  Clinical Course as of Mar 06 1951  Mon Mar 06, 2019  1511 Blood type O+   [JK]    Clinical Course User Index [JK] Dorie Rank, MD     Patient presents to the ED with complaints of vaginal bleeding.  She appears to be having a threatened miscarriage.  Her son did show evidence of a subchorionic hemorrhage.  Patient has normal cardiac activity.  Discussed importance of close outpatient follow-up with OB/GYN.   Final Clinical Impressions(s) / ED Diagnoses   Final diagnoses:  Threatened miscarriage    ED Discharge Orders    None       Dorie Rank, MD 03/06/19 [phone]

## 2019-03-07 ENCOUNTER — Other Ambulatory Visit: Payer: Self-pay | Admitting: Family Medicine

## 2019-03-21 ENCOUNTER — Telehealth: Payer: Self-pay | Admitting: General Practice

## 2019-03-21 NOTE — Telephone Encounter (Signed)
Received alert from Babys[employer]s due to elevated blood pressure 6/26 141/87. Attempted to call patient to have her recheck her blood pressure, no answer- left message to recheck blood pressure and put numbers into the app. If blood pressure is still high, please call us back. Patient has new OB appt 7/2. Will send mychart message.

## 2019-03-22 ENCOUNTER — Telehealth: Payer: Self-pay | Admitting: Obstetrics & Gynecology

## 2019-03-22 ENCOUNTER — Telehealth: Payer: Self-pay | Admitting: Obstetrics and Gynecology

## 2019-03-22 NOTE — Telephone Encounter (Signed)
Called the patient to pre-screen. Left a detailed voicemail informing if the patient is experiencing any flu-like symptoms such as fever, chills, cough, or shortness of breath and/or has been in contact with anyone who is suspected of/or confirmed to have COVID19 please call back to reschedule the appointment. Upon entering the office please wear a face mask, sanitize hands, and no visitors or children due to [place] restrictions.

## 2019-03-22 NOTE — Telephone Encounter (Signed)
Attempted to call patient to ask her about any symptoms, and to wear her mask the whole visit covering her nose and mouth, and no visitors.  Also, to sanitize her hands upon arriving. A message was left on her voicemail.  °

## 2019-03-23 ENCOUNTER — Encounter: Payer: Self-pay | Admitting: Obstetrics and Gynecology

## 2019-03-23 ENCOUNTER — Encounter: Payer: BC Managed Care – PPO | Admitting: Obstetrics and Gynecology

## 2019-03-23 ENCOUNTER — Telehealth: Payer: Self-pay | Admitting: Obstetrics and Gynecology

## 2019-03-23 NOTE — Telephone Encounter (Signed)
Attempted to call patient about her missed appointment. Left a voice message for her to call us.

## 2019-04-12 ENCOUNTER — Other Ambulatory Visit (HOSPITAL_COMMUNITY)
Admission: RE | Admit: 2019-04-12 | Discharge: 2019-04-12 | Disposition: A | Discharge status: Home / Self Care | Payer: BC Managed Care – PPO | Source: Ambulatory Visit | Attending: Obstetrics and Gynecology | Admitting: Obstetrics and Gynecology

## 2019-04-12 ENCOUNTER — Encounter: Payer: Self-pay | Admitting: Obstetrics and Gynecology

## 2019-04-12 ENCOUNTER — Ambulatory Visit (INDEPENDENT_AMBULATORY_CARE_PROVIDER_SITE_OTHER): Payer: BC Managed Care – PPO | Admitting: Obstetrics and Gynecology

## 2019-04-12 ENCOUNTER — Other Ambulatory Visit: Payer: Self-pay

## 2019-04-12 VITALS — BP 132/76 | HR 96 | Temp 98.4°F | Wt 237.0 lb

## 2019-04-12 DIAGNOSIS — O0990 Supervision of high risk pregnancy, unspecified, unspecified trimester: Secondary | ICD-10-CM

## 2019-04-12 DIAGNOSIS — O0991 Supervision of high risk pregnancy, unspecified, first trimester: Secondary | ICD-10-CM

## 2019-04-12 DIAGNOSIS — R87613 High grade squamous intraepithelial lesion on cytologic smear of cervix (HGSIL): Secondary | ICD-10-CM

## 2019-04-13 NOTE — Progress Notes (Signed)
New OB Note  04/12/2019   Clinic: [facility]  Chief Complaint: NOB  Transfer of Care Patient: no  History of Present Illness: Deanna [name] is a 23 y.o. G3P1011 @ 15/1 weeks ([place] 1/13, based on 7wk u/s) Patient's last menstrual period was 12/05/2018 (exact date).  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   Any events prior to today's visit: no Her periods were: qmonth, regular She was using no method when she conceived.  She has Negative signs or symptoms of nausea/vomiting of pregnancy. She has Negative signs or symptoms of miscarriage or preterm labor On any medications around the time she conceived/early pregnancy: Prozac  ROS: A 12-point review of systems was performed and negative, except as stated in the above HPI.  OBGYN History: As per HPI. OB History  Gravida Para Term Preterm AB Living  3 1 1  0 1 1  SAB TAB Ectopic Multiple Live Births  0 1 0 0 1    # Outcome Date GA Lbr Len/2nd Weight Sex Delivery Anes PTL Lv  3 Current           2 Term 11/19/16 56w0d24:00 / 00:10 6 lb 8.1 oz (2.95 kg) M Vag-Spont None  LIV  1 TAB 04/2015            Any issues with any prior pregnancies: No, patient did well with her Crohn's with her last pregnancy and had no issues Prior children are healthy, doing well, and without any problems or issues: yes History of pap smears: Yes. Last pap smear 06/2018 and results were HSIL. Patient did not keep colpo appt   Past Medical History: Past Medical History:  Diagnosis Date  . Anxiety   . Arthritis, 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[place]   . [place] disease ([place]    dx in [place]  . Depression   . Infection    UTI x many    Past Surgical  History: Past Surgical History:  Procedure Laterality Date  . COLONOSCOPY      Family History:  Family History  Problem Relation Age of Onset  . Diabetes Maternal Grandmother   . Gallstones Mother   . Anxiety disorder Mother   . Depression Mother   . Stomach cancer Maternal Aunt   . Diabetes Maternal Grandfather   . Heart disease Maternal Grandfather   . Stroke Maternal Grandfather   . Colon cancer Maternal Grandfather     Social History:  Social History   Socioeconomic History  . Marital status: Single    Spouse name: Not on file  . Number of children: 1  . Years of education: Not on file  . Highest education level: Not on file  Occupational History  . Occupation: sShip broker Social Needs  . Financial resource strain: Not on file  . Food insecurity    Worry: Never true    Inability: Never true  . Transportation needs    Medical: No    Non-medical: No  Tobacco Use  . Smoking status: Former Smoker    Types: E-cigarettes, Cigarettes  . Smokeless tobacco: Never Used  . Tobacco comment: vaping in high school  Substance and Sexual Activity  . Alcohol use: No  . Drug use: No  . Sexual activity: Yes  Birth control/protection: None  Lifestyle  . Physical activity    Days per week: Not on file    Minutes per session: Not on file  . Stress: Not on file  Relationships  . Social Herbalist on phone: Not on file    Gets together: Not on file    Attends religious service: Not on file    Active member of club or organization: Not on file    Attends meetings of clubs or organizations: Not on file    Relationship status: Not on file  . Intimate partner violence    Fear of current or ex partner: No    Emotionally abused: No    Physically abused: No    Forced sexual activity: No  Other Topics Concern  . Not on file  Social History Narrative  . Not on file    Allergy: No Known Allergies  Health Maintenance:  Mammogram Up to Date: not  applicable  Current Outpatient Medications: PNV, Prozac  Physical Exam:   BP 132/76   Pulse 96   Temp 98.4 F (36.9 C)   Wt 237 lb (107.5 kg)   LMP 12/05/2018 (Exact Date)   BMI 37.12 kg/m  Body mass index is 37.12 kg/m. Contractions: Not present Vag. Bleeding: None. Fundal height: not applicable FHTs: 280K  General appearance: Well nourished, well developed female in no acute distress.  Neck:  Supple, normal appearance, and no thyromegaly  Cardiovascular: S1, S2 normal, no murmur, rub or gallop, regular rate and rhythm Respiratory:  Clear to auscultation bilateral. Normal respiratory effort Abdomen: positive bowel sounds and no masses, hernias; diffusely non tender to palpation, non distended Breasts: pt declines any breast s/s. Neuro/Psych:  Normal mood and affect.  Skin:  Warm and dry.  Lymphatic:  No inguinal lymphadenopathy.   Pelvic exam: is not limited by body habitus EGBUS: within normal limits, Vagina: within normal limits and with no blood in the vault, Cervix: normal appearing cervix without discharge or lesions, closed/long/high, Uterus:  enlarged, c/w 16 week size, and Adnexa:  normal adnexa and no mass, fullness, tenderness  Laboratory: none  Imaging:  reviewed  Assessment: pt doing well  Plan: 1. Supervision of high risk pregnancy, antepartum Routine care. Pt amenable to genetics. Anatomy u/s scheduled.  - Comprehensive metabolic panel - TSH - AFP, Serum, Open Spina Bifida - Protein / creatinine ratio, urine - Hemoglobin A1c - Obstetric Panel, Including HIV - Cytology - PAP - Culture, OB Urine - [place] MFM OB DETAIL +14 WK; Future - Genetic Screening  2. Rheumatology Patient states needs to make follow up appt with her rheumatologist  3. GI No issues with crohn's with last pregnancy and never needed medications but she states she needs a visit with her GI  4. HGSIL Repeat pap today.   Problem list reviewed and updated.  Follow up in 4  weeks.  The nature of [place] with multiple MDs and other Advanced Practice Providers was explained to patient; also emphasized that residents, students are part of our team.  >50% of 25 min visit spent on counseling and coordination of care.     Durene Romans MD Attending [facility])

## 2019-04-14 LAB — COMPREHENSIVE METABOLIC PANEL
ALT: 5 IU/L (ref 0–32)
AST: 7 IU/L (ref 0–40)
Albumin/Globulin Ratio: 1.1 — ABNORMAL LOW (ref 1.2–2.2)
Albumin: 3.9 g/dL (ref 3.9–5.0)
Alkaline Phosphatase: 57 IU/L (ref 39–117)
BUN/Creatinine Ratio: 11 (ref 9–23)
BUN: 7 mg/dL (ref 6–20)
Bilirubin Total: <0.2 mg/dL (ref 0.0–1.2)
CO2: 20 mmol/L (ref 20–29)
Calcium: 9.4 mg/dL (ref 8.7–10.2)
Chloride: 100 mmol/L (ref 96–106)
Creatinine, Ser: 0.63 mg/dL (ref 0.57–1.00)
GFR calc Af Amer: 147 mL/min/{1.73_m2} (ref >59)
GFR calc non Af Amer: 128 mL/min/{1.73_m2} (ref >59)
Globulin, Total: 3.5 g/dL (ref 1.5–4.5)
Glucose: 86 mg/dL (ref 65–99)
Potassium: 4.5 mmol/L (ref 3.5–5.2)
Sodium: 136 mmol/L (ref 134–144)
Total Protein: 7.4 g/dL (ref 6.0–8.5)

## 2019-04-14 LAB — AFP, SERUM, OPEN SPINA BIFIDA
AFP MoM: 1.16
AFP Value: 27.4 ng/mL
Gest. Age on Collection Date: 15 weeks
Maternal Age At EDD: 23.1 yr
OSBR Risk 1 IN: 10000
Test Results:: NEGATIVE
Weight: 237 [lb_av]

## 2019-04-14 LAB — OBSTETRIC PANEL, INCLUDING HIV
Antibody Screen: NEGATIVE
Basophils Absolute: 0 10*3/uL (ref 0.0–0.2)
Basos: 1 %
EOS (ABSOLUTE): 0.1 10*3/uL (ref 0.0–0.4)
Eos: 1 %
HIV Screen 4th Generation wRfx: NONREACTIVE
Hematocrit: 40.8 % (ref 34.0–46.6)
Hemoglobin: 13.3 g/dL (ref 11.1–15.9)
Hepatitis B Surface Ag: NEGATIVE
Immature Grans (Abs): 0 10*3/uL (ref 0.0–0.1)
Immature Granulocytes: 0 %
Lymphocytes Absolute: 1.8 10*3/uL (ref 0.7–3.1)
Lymphs: 29 %
MCH: 29.3 pg (ref 26.6–33.0)
MCHC: 32.6 g/dL (ref 31.5–35.7)
MCV: 90 fL (ref 79–97)
Monocytes Absolute: 0.4 10*3/uL (ref 0.1–0.9)
Monocytes: 7 %
Neutrophils Absolute: 3.9 10*3/uL (ref 1.4–7.0)
Neutrophils: 62 %
Platelets: 282 10*3/uL (ref 150–450)
RBC: 4.54 x10E6/uL (ref 3.77–5.28)
RDW: 13 % (ref 11.7–15.4)
RPR Ser Ql: NONREACTIVE
Rh Factor: POSITIVE
Rubella Antibodies, IGG: 1.6 index (ref >0.99)
WBC: 6.3 10*3/uL (ref 3.4–10.8)

## 2019-04-14 LAB — HEMOGLOBIN A1C
Est. average glucose Bld gHb Est-mCnc: 100 mg/dL
Hgb A1c MFr Bld: 5.1 % (ref 4.8–5.6)

## 2019-04-14 LAB — PROTEIN / CREATININE RATIO, URINE
Creatinine, Urine: 26.2 mg/dL
Protein, Ur: <4 mg/dL

## 2019-04-14 LAB — CULTURE, OB URINE

## 2019-04-14 LAB — TSH: TSH: 1.4 u[IU]/mL (ref 0.450–4.500)

## 2019-04-14 LAB — URINE CULTURE, OB REFLEX

## 2019-04-15 LAB — CYTOLOGY - PAP
Chlamydia: NEGATIVE
Diagnosis: HIGH — AB
HPV: DETECTED — AB
Neisseria Gonorrhea: NEGATIVE
Trichomonas: NEGATIVE

## 2019-04-17 ENCOUNTER — Encounter: Payer: Self-pay | Admitting: Obstetrics and Gynecology

## 2019-04-21 ENCOUNTER — Encounter: Payer: Self-pay | Admitting: *Deleted

## 2019-04-25 ENCOUNTER — Telehealth: Payer: Self-pay | Admitting: Obstetrics and Gynecology

## 2019-04-25 NOTE — Telephone Encounter (Signed)
GYN Telephone Note Patient called at [phone] and generic VM picked up. VM left for her to call the clinic.  Please inform patient of persistently abnormal pap smear and need for colposcopy and possible cervical biopsies at her next OB visit. Request sent to have clinic send certified letter  Durene Romans MD Attending [facility] for [place] (Faculty Practice) 04/25/2019 Time: [id number]

## 2019-04-26 ENCOUNTER — Encounter: Payer: Self-pay | Admitting: *Deleted

## 2019-05-09 ENCOUNTER — Telehealth: Payer: Self-pay | Admitting: Medical

## 2019-05-09 NOTE — Telephone Encounter (Signed)
Called the patient to confirm the upcoming appointment. The patient also answered yes to one of the covid screening questions- sore throat. After speaking with a physician Warner Mccreedy) It was determined the patient should be rescheduled.

## 2019-05-10 ENCOUNTER — Ambulatory Visit (HOSPITAL_COMMUNITY): Payer: BC Managed Care – PPO | Attending: Obstetrics and Gynecology

## 2019-05-10 ENCOUNTER — Ambulatory Visit (HOSPITAL_COMMUNITY): Payer: BC Managed Care – PPO

## 2019-05-10 ENCOUNTER — Encounter: Payer: BC Managed Care – PPO | Admitting: Medical

## 2019-05-10 ENCOUNTER — Telehealth: Payer: Self-pay | Admitting: Obstetrics & Gynecology

## 2019-05-10 NOTE — Telephone Encounter (Signed)
Attempted to reach Ms. Kopera to inform her of an appointment change. Left a message on her voicemail.

## 2019-05-31 ENCOUNTER — Encounter: Payer: BC Managed Care – PPO | Admitting: Medical

## 2019-05-31 NOTE — Telephone Encounter (Signed)
Opened in error

## 2019-06-05 ENCOUNTER — Encounter: Payer: BC Managed Care – PPO | Admitting: Obstetrics & Gynecology

## 2019-06-05 ENCOUNTER — Encounter: Payer: Self-pay | Admitting: Family Medicine

## 2019-06-12 ENCOUNTER — Other Ambulatory Visit: Payer: Self-pay

## 2019-06-12 ENCOUNTER — Ambulatory Visit (INDEPENDENT_AMBULATORY_CARE_PROVIDER_SITE_OTHER): Payer: BC Managed Care – PPO | Admitting: Advanced Practice Midwife

## 2019-06-12 ENCOUNTER — Encounter: Payer: Self-pay | Admitting: Advanced Practice Midwife

## 2019-06-12 VITALS — BP 110/73 | HR 98 | Wt 239.1 lb

## 2019-06-12 DIAGNOSIS — Z3482 Encounter for supervision of other normal pregnancy, second trimester: Secondary | ICD-10-CM

## 2019-06-12 DIAGNOSIS — Z3A23 23 weeks gestation of pregnancy: Secondary | ICD-10-CM

## 2019-06-12 DIAGNOSIS — Z3480 Encounter for supervision of other normal pregnancy, unspecified trimester: Secondary | ICD-10-CM

## 2019-06-12 NOTE — Progress Notes (Signed)
   PRENATAL VISIT NOTE  Subjective:  Deanna [name] is a 23 y.o. G3P1011 at 10w5dbeing seen today for ongoing prenatal care.  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  Patient reports no complaints.  Contractions: Not present. Vag. Bleeding: None.  Movement: Present. Denies leaking of fluid.   The following portions of the patient's history were reviewed and updated as appropriate: allergies, current medications, past family history, past medical history, past social history, past surgical history and problem list.   Objective:   Vitals:   06/12/19 1534  BP: 110/73  Pulse: 98  Weight: 239 lb 1.6 oz (108.5 kg)    Fetal Status: Fetal Heart Rate (bpm): 145   Movement: Present     General:  Alert, oriented and cooperative. Patient is in no acute distress.  Skin: Skin is warm and dry. No rash noted.   Cardiovascular: Normal heart rate noted  Respiratory: Normal respiratory effort, no problems with respiration noted  Abdomen: Soft, gravid, appropriate for gestational age.  Pain/Pressure: Absent     Pelvic: Cervical exam deferred        Extremities: Normal range of motion.  Edema: None  Mental Status: Normal mood and affect. Normal behavior. Normal judgment and thought content.   Assessment and Plan:  Pregnancy: G3P1011 at 258w5d. Supervision of other normal pregnancy, antepartum - Stopped prozac, and is unsure about going back on. She would like to see JaRoselyn Reefrior to restarting this medication.   Preterm labor symptoms and general obstetric precautions including but not limited to vaginal bleeding, contractions, leaking  of fluid and fetal movement were reviewed in detail with the patient. Please refer to After Visit Summary for other counseling recommendations.   Return in about 4 weeks (around 07/10/2019) for for GTT and 28 week labs .  No future appointments.  HeMarcille BuffyNP, CNM  06/12/19  4:21 PM

## 2019-06-19 ENCOUNTER — Other Ambulatory Visit: Payer: Self-pay

## 2019-06-19 ENCOUNTER — Ambulatory Visit (INDEPENDENT_AMBULATORY_CARE_PROVIDER_SITE_OTHER): Payer: BC Managed Care – PPO | Admitting: Clinical

## 2019-06-19 DIAGNOSIS — F4323 Adjustment disorder with mixed anxiety and depressed mood: Secondary | ICD-10-CM

## 2019-06-19 NOTE — BH Specialist Note (Signed)
Integrated Behavioral Health via Telemedicine Video Visit  06/19/2019 TANAYSHA ALKINS [id number]  Number of Powers visits: 1 (2 total) Session Start time: 1:21  Session End time: 2:13 Total time: 50 minutes  Referring Provider: Marcille Buffy, CNM Type of Visit: Video Patient/Family location: [facility] Provider location: [facility] All persons participating in visit: Patient Yanely Mast and Ayden  Confirmed patient's address: Yes  Confirmed patient's phone number: Yes  Any changes to demographics: No   Confirmed patient's insurance: Yes  Any changes to patient's insurance: No   Discussed confidentiality: Yes   I connected with Myles Lipps Gong by a video enabled telemedicine application and verified that I am speaking with the correct person using two identifiers.     I discussed the limitations of evaluation and management by telemedicine and the availability of in person appointments.  I discussed that the purpose of this visit is to provide behavioral health care while limiting exposure to the novel coronavirus.   Discussed there is a possibility of technology failure and discussed alternative modes of communication if that failure occurs.  I discussed that engaging in this video visit, they consent to the provision of behavioral healthcare and the services will be billed under their insurance.  Patient and/or legal guardian expressed understanding and consented to video visit: Yes   PRESENTING CONCERNS: Patient and/or family reports the following symptoms/concerns: Pt states her primary concern today is feeling overwhelmed this pregnancy, with daily symptoms of fatigue, irritability, difficulty relaxing, anxiety, and crying spells. Pt plans to stop taking Prozac in last month of pregnancy, to limit side effects in her son.  Duration of problem: Current pregnancy; Severity of problem: moderate  STRENGTHS (Protective Factors/Coping  Skills): Open to treatment; self-awareness  GOALS ADDRESSED: Patient will: 1.  Reduce symptoms of: anxiety, depression and stress  2.  Increase knowledge and/or ability of: healthy habits and stress reduction  3.  Demonstrate ability to: Increase healthy adjustment to current life circumstances and Increase motivation to adhere to plan of care  INTERVENTIONS: Interventions utilized:  Solution-Focused Strategies and Psychoeducation and/or Health Education Standardized Assessments completed: GAD-7 and PHQ 9  ASSESSMENT: Patient currently experiencing Adjustment disorder with mixed anxiety and depression  Patient may benefit from psychoeducation and brief therapeutic interventions regarding coping with symptoms of anxiety and depression .  PLAN: 1. Follow up with behavioral health clinician on : One month 2. Behavioral recommendations:  -Put 2/3 of 23yo's toys out of sight in containers, leaving only 1/3 left out in his room (take 2-5 items out during his daily naptime until complete) -Continue taking prenatal vitamin daily -Continue taking Prozac as prescribed 3. Referral(s): [facility] (In Clinic)  I discussed the assessment and treatment plan with the patient and/or parent/guardian. They were provided an opportunity to ask questions and all were answered. They agreed with the plan and demonstrated an understanding of the instructions.   They were advised to call back or seek an in-person evaluation if the symptoms worsen or if the condition fails to improve as anticipated.  Caroleen Hamman [facility]   Depression screen [facility] 2/9 06/19/2019 10/26/2018 07/07/2018 12/30/2016 11/16/2016  Decreased Interest 2 3 0 0 0  Down, Depressed, Hopeless 1 2 1  0 0  PHQ - 2 Score 3 5 1  0 0  Altered sleeping 1 3 0 1 0  Tired, decreased energy 3 2 0 1 0  Change in appetite 1 3 1  0 0  Feeling bad or failure about  yourself  1 2 1  0 0  Trouble concentrating 0 1 - 0 0  Moving slowly or  fidgety/restless 1 - 2 0 0  Suicidal thoughts 0 0 0 0 0  PHQ-9 Score 10 16 5 2  0  Difficult doing work/chores - Very difficult - - -  Some recent data might be hidden   GAD 7 : Generalized Anxiety Score 06/19/2019 10/26/2018 07/07/2018 12/30/2016  Nervous, Anxious, on Edge 3 3 0 0  Control/stop worrying 1 3 0 1  Worry too much - different things 1 3 2 1   Trouble relaxing 3 3 0 1  Restless 1 3 0 0  Easily annoyed or irritable 3 1 0 2  Afraid - awful might happen 0 1 0 0  Total GAD 7 Score 12 17 2 5   Anxiety Difficulty - Very difficult - -

## 2019-06-21 ENCOUNTER — Encounter (HOSPITAL_COMMUNITY): Payer: Self-pay

## 2019-06-21 ENCOUNTER — Other Ambulatory Visit: Payer: Self-pay | Admitting: Obstetrics and Gynecology

## 2019-06-21 ENCOUNTER — Ambulatory Visit (HOSPITAL_COMMUNITY): Payer: BC Managed Care – PPO | Admitting: *Deleted

## 2019-06-21 ENCOUNTER — Other Ambulatory Visit: Payer: Self-pay

## 2019-06-21 ENCOUNTER — Ambulatory Visit (HOSPITAL_COMMUNITY)
Admission: RE | Admit: 2019-06-21 | Discharge: 2019-06-21 | Disposition: A | Discharge status: Home / Self Care | Payer: BC Managed Care – PPO | Source: Ambulatory Visit | Attending: Obstetrics and Gynecology | Admitting: Obstetrics and Gynecology

## 2019-06-21 DIAGNOSIS — O99612 Diseases of the digestive system complicating pregnancy, second trimester: Secondary | ICD-10-CM

## 2019-06-21 DIAGNOSIS — O0990 Supervision of high risk pregnancy, unspecified, unspecified trimester: Secondary | ICD-10-CM

## 2019-06-21 DIAGNOSIS — Z3A25 25 weeks gestation of pregnancy: Secondary | ICD-10-CM | POA: Diagnosis not present

## 2019-06-21 DIAGNOSIS — O99212 Obesity complicating pregnancy, second trimester: Secondary | ICD-10-CM | POA: Diagnosis not present

## 2019-06-22 ENCOUNTER — Other Ambulatory Visit (HOSPITAL_COMMUNITY): Payer: Self-pay | Admitting: *Deleted

## 2019-06-22 DIAGNOSIS — O365929 Maternal care for other known or suspected poor fetal growth, second trimester, other fetus: Secondary | ICD-10-CM

## 2019-06-25 ENCOUNTER — Encounter: Payer: Self-pay | Admitting: Obstetrics and Gynecology

## 2019-06-25 DIAGNOSIS — O365990 Maternal care for other known or suspected poor fetal growth, unspecified trimester, not applicable or unspecified: Secondary | ICD-10-CM | POA: Insufficient documentation

## 2019-06-25 DIAGNOSIS — Z8759 Personal history of other complications of pregnancy, childbirth and the puerperium: Secondary | ICD-10-CM | POA: Insufficient documentation

## 2019-07-05 ENCOUNTER — Ambulatory Visit (HOSPITAL_COMMUNITY): Payer: BC Managed Care – PPO | Admitting: *Deleted

## 2019-07-05 ENCOUNTER — Ambulatory Visit (HOSPITAL_COMMUNITY)
Admission: RE | Admit: 2019-07-05 | Discharge: 2019-07-05 | Disposition: A | Discharge status: Home / Self Care | Payer: BC Managed Care – PPO | Source: Ambulatory Visit | Attending: Obstetrics | Admitting: Obstetrics

## 2019-07-05 ENCOUNTER — Other Ambulatory Visit: Payer: Self-pay

## 2019-07-05 ENCOUNTER — Encounter (HOSPITAL_COMMUNITY): Payer: Self-pay

## 2019-07-05 DIAGNOSIS — O0990 Supervision of high risk pregnancy, unspecified, unspecified trimester: Secondary | ICD-10-CM | POA: Diagnosis not present

## 2019-07-05 DIAGNOSIS — O99612 Diseases of the digestive system complicating pregnancy, second trimester: Secondary | ICD-10-CM

## 2019-07-05 DIAGNOSIS — Z3A27 27 weeks gestation of pregnancy: Secondary | ICD-10-CM | POA: Diagnosis not present

## 2019-07-05 DIAGNOSIS — O365929 Maternal care for other known or suspected poor fetal growth, second trimester, other fetus: Secondary | ICD-10-CM | POA: Diagnosis not present

## 2019-07-05 DIAGNOSIS — O99212 Obesity complicating pregnancy, second trimester: Secondary | ICD-10-CM | POA: Diagnosis not present

## 2019-07-05 DIAGNOSIS — O365920 Maternal care for other known or suspected poor fetal growth, second trimester, not applicable or unspecified: Secondary | ICD-10-CM | POA: Diagnosis not present

## 2019-07-06 ENCOUNTER — Other Ambulatory Visit: Payer: Self-pay | Admitting: Emergency Medicine

## 2019-07-06 ENCOUNTER — Other Ambulatory Visit (HOSPITAL_COMMUNITY): Payer: Self-pay | Admitting: *Deleted

## 2019-07-06 DIAGNOSIS — O0990 Supervision of high risk pregnancy, unspecified, unspecified trimester: Secondary | ICD-10-CM

## 2019-07-06 DIAGNOSIS — O365990 Maternal care for other known or suspected poor fetal growth, unspecified trimester, not applicable or unspecified: Secondary | ICD-10-CM

## 2019-07-11 ENCOUNTER — Ambulatory Visit (HOSPITAL_COMMUNITY)
Admission: RE | Admit: 2019-07-11 | Discharge: 2019-07-11 | Disposition: A | Discharge status: Home / Self Care | Payer: BC Managed Care – PPO | Source: Ambulatory Visit | Attending: Obstetrics | Admitting: Obstetrics

## 2019-07-11 ENCOUNTER — Ambulatory Visit (HOSPITAL_COMMUNITY): Payer: BC Managed Care – PPO | Admitting: *Deleted

## 2019-07-11 ENCOUNTER — Encounter: Payer: BC Managed Care – PPO | Admitting: Student

## 2019-07-11 ENCOUNTER — Other Ambulatory Visit: Payer: BC Managed Care – PPO

## 2019-07-11 ENCOUNTER — Encounter (HOSPITAL_COMMUNITY): Payer: Self-pay | Admitting: *Deleted

## 2019-07-11 ENCOUNTER — Other Ambulatory Visit: Payer: Self-pay

## 2019-07-11 DIAGNOSIS — O365920 Maternal care for other known or suspected poor fetal growth, second trimester, not applicable or unspecified: Secondary | ICD-10-CM

## 2019-07-11 DIAGNOSIS — O99212 Obesity complicating pregnancy, second trimester: Secondary | ICD-10-CM | POA: Diagnosis not present

## 2019-07-11 DIAGNOSIS — Z362 Encounter for other antenatal screening follow-up: Secondary | ICD-10-CM | POA: Diagnosis not present

## 2019-07-11 DIAGNOSIS — Z3A27 27 weeks gestation of pregnancy: Secondary | ICD-10-CM | POA: Diagnosis not present

## 2019-07-11 DIAGNOSIS — O365990 Maternal care for other known or suspected poor fetal growth, unspecified trimester, not applicable or unspecified: Secondary | ICD-10-CM | POA: Diagnosis not present

## 2019-07-11 DIAGNOSIS — O99612 Diseases of the digestive system complicating pregnancy, second trimester: Secondary | ICD-10-CM

## 2019-07-11 DIAGNOSIS — O0990 Supervision of high risk pregnancy, unspecified, unspecified trimester: Secondary | ICD-10-CM

## 2019-07-11 NOTE — Procedures (Signed)
Deanna [name] [birth year] [redacted]w[redacted]d Fetus A Non-Stress Test Interpretation for 07/11/19  Indication: IUGR  Fetal Heart Rate A Mode: External Baseline Rate (A): 140 bpm Variability: Moderate Accelerations: 10 x 10 Decelerations: None Multiple birth?: No  Uterine Activity Mode: Palpation, Toco Contraction Frequency (min): None Resting Tone Palpated: Relaxed Resting Time: Adequate  Interpretation (Fetal Testing) Nonstress Test Interpretation: Reactive Overall Impression: Reassuring for gestational age Comments: EFM tracing reviewed by Dr. BGertie Exon

## 2019-07-12 ENCOUNTER — Ambulatory Visit (INDEPENDENT_AMBULATORY_CARE_PROVIDER_SITE_OTHER): Payer: BC Managed Care – PPO | Admitting: Medical

## 2019-07-12 ENCOUNTER — Other Ambulatory Visit: Payer: BC Managed Care – PPO

## 2019-07-12 VITALS — BP 117/77 | HR 98 | Wt 246.0 lb

## 2019-07-12 DIAGNOSIS — Z23 Encounter for immunization: Secondary | ICD-10-CM

## 2019-07-12 DIAGNOSIS — O0990 Supervision of high risk pregnancy, unspecified, unspecified trimester: Secondary | ICD-10-CM

## 2019-07-12 DIAGNOSIS — K50813 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: Secondary | ICD-10-CM

## 2019-07-12 DIAGNOSIS — O99343 Other mental disorders complicating pregnancy, third trimester: Secondary | ICD-10-CM

## 2019-07-12 DIAGNOSIS — R87613 High grade squamous intraepithelial lesion on cytologic smear of cervix (HGSIL): Secondary | ICD-10-CM

## 2019-07-12 DIAGNOSIS — O365930 Maternal care for other known or suspected poor fetal growth, third trimester, not applicable or unspecified: Secondary | ICD-10-CM

## 2019-07-12 DIAGNOSIS — F418 Other specified anxiety disorders: Secondary | ICD-10-CM

## 2019-07-12 DIAGNOSIS — M0880 Other juvenile arthritis, unspecified site: Secondary | ICD-10-CM

## 2019-07-12 NOTE — Progress Notes (Signed)
PRENATAL VISIT NOTE  Subjective:  Deanna [name] is a 23 y.o. G3P1011 at 21w0dbeing seen today for ongoing prenatal care.  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  Patient reports no complaints.  Contractions: Not present. Vag. Bleeding: None.  Movement: Present. Denies leaking of fluid.   The following portions of the patient's history were reviewed and updated as appropriate: allergies, current medications, past family history, past medical history, past social history, past surgical history and problem list.   Objective:   Vitals:   07/12/19 0949  BP: 117/77  Pulse: 98  Weight: 246 lb (111.6 kg)    Fetal Status: Fetal Heart Rate (bpm): 140   Movement: Present     General:  Alert, oriented and cooperative. Patient is in no acute distress.  Skin: Skin is warm and dry. No rash noted.   Cardiovascular: Normal heart rate noted  Respiratory: Normal respiratory effort, no problems with respiration noted  Abdomen: Soft, gravid, appropriate for gestational age.  Pain/Pressure: Present     Pelvic: Cervical exam deferred        Extremities: Normal range of motion.  Edema: Trace  Mental Status: Normal mood and affect. Normal behavior. Normal judgment and thought content.   Assessment and Plan:  Pregnancy: G3P1011 at 268w0d. Supervision of high risk pregnancy, antepartum - 2 hour GTT, CBC, HIV and RPR today  - Flu Vaccine QUAD 36+ mos IM - Tdap vaccine greater than or equal to 7yo IM  2. HGSIL (high grade squamous intraepithelial lesion) on Pap smear of  cervix - Scheduled for colpo at next visit and will need PP colpo as well   3. Poor fetal growth affecting management of mother in third trimester, single or unspecified fetus - Last [place] 07/11/19 and showed EFW 2%, has follow-up weekly dopplers and growth in 4 weeks scheduled  - Delivery timing will be per MFM   4. Depression with anxiety  5. 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HCHi[place]- Has not seen Rheumatology. Referral sent today.   Preterm labor symptoms and general obstetric precautions including but not limited to vaginal bleeding, contractions, leaking of fluid and fetal movement were reviewed in detail with the patient. Please refer to After Visit Summary for other counseling recommendations.   Return in about 2 weeks (around 07/26/2019) [facility].  Future Appointments  Date Time Provider [place]10/26/2020  1:15 PM [place]/2020  9:30 AM [place]FC-[place]  07/18/2019  9:30 AM WH-MFC [place] WH-MFCUS MFC-US  07/18/2019 10:45 AM WH-MFC NST [place]FC-US  07/25/2019  9:45 AM WH-MFC NST [place]Penas[place]FC-US  07/25/2019 11:15 AM WH-MFC NURSE WH-MFC MFC-US  07/25/2019 11:15 AM WH-MFC [place] WH-MFCUS MFC-US  07/26/2019  9:55 AM Constant, PeVickii ChafeMD WOC-WOCA WOC  08/01/2019 10:45 AM WH-MFC NST WHChums CornerFC-US  08/01/2019 11:30 AM WH-MFC NURSE WH-MFC MFC-US  08/01/2019 11:30 AM WH-MFC [place] WH-MFCUS MFC-US  08/08/2019  1:15 PM [place], KaMervyn SkeetersCNM WOC-WOCA WOC    JuKerry HoughPA-C

## 2019-07-12 NOTE — Patient Instructions (Signed)
Fetal Movement Counts Patient Name: ________________________________________________ Patient Due Date: ____________________ What is a fetal movement count?  A fetal movement count is the number of times that you feel your baby move during a certain amount of time. This may also be called a fetal kick count. A fetal movement count is recommended for every pregnant woman. You may be asked to start counting fetal movements as early as week 28 of your pregnancy. Pay attention to when your baby is most active. You may notice your baby's sleep and wake cycles. You may also notice things that make your baby move more. You should do a fetal movement count:  When your baby is normally most active.  At the same time each day. A good time to count movements is while you are resting, after having something to eat and drink. How do I count fetal movements? 1. Find a quiet, comfortable area. Sit, or lie down on your side. 2. Write down the date, the start time and stop time, and the number of movements that you felt between those two times. Take this information with you to your health care visits. 3. For 2 hours, count kicks, flutters, swishes, rolls, and jabs. You should feel at least 10 movements during 2 hours. 4. You may stop counting after you have felt 10 movements. 5. If you do not feel 10 movements in 2 hours, have something to eat and drink. Then, keep resting and counting for 1 hour. If you feel at least 4 movements during that hour, you may stop counting. Contact a health care provider if:  You feel fewer than 4 movements in 2 hours.  Your baby is not moving like he or she usually does. Date: ____________ Start time: ____________ Stop time: ____________ Movements: ____________ Date: ____________ Start time: ____________ Stop time: ____________ Movements: ____________ Date: ____________ Start time: ____________ Stop time: ____________ Movements: ____________ Date: ____________ Start time:  ____________ Stop time: ____________ Movements: ____________ Date: ____________ Start time: ____________ Stop time: ____________ Movements: ____________ Date: ____________ Start time: ____________ Stop time: ____________ Movements: ____________ Date: ____________ Start time: ____________ Stop time: ____________ Movements: ____________ Date: ____________ Start time: ____________ Stop time: ____________ Movements: ____________ Date: ____________ Start time: ____________ Stop time: ____________ Movements: ____________ This information is not intended to replace advice given to you by your health care provider. Make sure you discuss any questions you have with your health care provider. Document Released: 10/07/2006 Document Revised: 09/27/2018 Document Reviewed: 10/17/2015 Elsevier Patient Education  2020 [employer] of the uterus can occur throughout pregnancy, but they are not always a sign that you are in labor. You may have practice contractions called Braxton Hicks contractions. These false labor contractions are sometimes confused with true labor. What are [place] contractions? Braxton Hicks contractions are tightening movements that occur in the muscles of the uterus before labor. Unlike true labor contractions, these contractions do not result in opening (dilation) and thinning of the cervix. Toward the end of pregnancy (32-34 weeks), Braxton Hicks contractions can happen more often and may become stronger. These contractions are sometimes difficult to tell apart from true labor because they can be very uncomfortable. You should not feel embarrassed if you go to the hospital with false labor. Sometimes, the only way to tell if you are in true labor is for your health care provider to look for changes in the cervix. The health care provider will do a physical exam and may monitor your contractions. If you  are not in true labor, the exam should show  that your cervix is not dilating and your water has not broken. If there are no other health problems associated with your pregnancy, it is completely safe for you to be sent home with false labor. You may continue to have Braxton Hicks contractions until you go into true labor. How to tell the difference between true labor and false labor True labor  Contractions last 30-70 seconds.  Contractions become very regular.  Discomfort is usually felt in the top of the uterus, and it spreads to the lower abdomen and low back.  Contractions do not go away with walking.  Contractions usually become more intense and increase in frequency.  The cervix dilates and gets thinner. False labor  Contractions are usually shorter and not as strong as true labor contractions.  Contractions are usually irregular.  Contractions are often felt in the front of the lower abdomen and in the groin.  Contractions may go away when you walk around or change positions while lying down.  Contractions get weaker and are shorter-lasting as time goes on.  The cervix usually does not dilate or become thin. Follow these instructions at home:   Take over-the-counter and prescription medicines only as told by your health care provider.  Keep up with your usual exercises and follow other instructions from your health care provider.  Eat and drink lightly if you think you are going into labor.  If Braxton Hicks contractions are making you uncomfortable: ? Change your position from lying down or resting to walking, or change from walking to resting. ? Sit and rest in a tub of warm water. ? Drink enough fluid to keep your urine pale yellow. Dehydration may cause these contractions. ? Do slow and deep breathing several times an hour.  Keep all follow-up prenatal visits as told by your health care provider. This is important. Contact a health care provider if:  You have a fever.  You have continuous pain in  your abdomen. Get help right away if:  Your contractions become stronger, more regular, and closer together.  You have fluid leaking or gushing from your vagina.  You pass blood-tinged mucus (bloody show).  You have bleeding from your vagina.  You have low back pain that you never had before.  You feel your baby's head pushing down and causing pelvic pressure.  Your baby is not moving inside you as much as it used to. Summary  Contractions that occur before labor are called Braxton Hicks contractions, false labor, or practice contractions.  Braxton Hicks contractions are usually shorter, weaker, farther apart, and less regular than true labor contractions. True labor contractions usually become progressively stronger and regular, and they become more frequent.  Manage discomfort from [facility] contractions by changing position, resting in a warm bath, drinking plenty of water, or practicing deep breathing. This information is not intended to replace advice given to you by your health care provider. Make sure you discuss any questions you have with your health care provider. Document Released: 01/21/2017 Document Revised: 08/20/2017 Document Reviewed: 01/21/2017 Elsevier Patient Education  2020 [employer].

## 2019-07-13 LAB — CBC
Hematocrit: 38 % (ref 34.0–46.6)
Hemoglobin: 12 g/dL (ref 11.1–15.9)
MCH: 28.8 pg (ref 26.6–33.0)
MCHC: 31.6 g/dL (ref 31.5–35.7)
MCV: 91 fL (ref 79–97)
Platelets: 212 10*3/uL (ref 150–450)
RBC: 4.17 x10E6/uL (ref 3.77–5.28)
RDW: 13.1 % (ref 11.7–15.4)
WBC: 8.6 10*3/uL (ref 3.4–10.8)

## 2019-07-13 LAB — GLUCOSE TOLERANCE, 2 HOURS W/ 1HR
Glucose, 1 hour: 96 mg/dL (ref 65–179)
Glucose, 2 hour: 78 mg/dL (ref 65–152)
Glucose, Fasting: 76 mg/dL (ref 65–91)

## 2019-07-13 LAB — RPR: RPR Ser Ql: NONREACTIVE

## 2019-07-13 LAB — HIV ANTIBODY (ROUTINE TESTING W REFLEX): HIV Screen 4th Generation wRfx: NONREACTIVE

## 2019-07-17 ENCOUNTER — Other Ambulatory Visit: Payer: Self-pay

## 2019-07-17 ENCOUNTER — Ambulatory Visit: Payer: BC Managed Care – PPO | Admitting: Clinical

## 2019-07-17 DIAGNOSIS — Z91199 Patient's noncompliance with other medical treatment and regimen due to unspecified reason: Secondary | ICD-10-CM

## 2019-07-17 DIAGNOSIS — Z5329 Procedure and treatment not carried out because of patient's decision for other reasons: Secondary | ICD-10-CM

## 2019-07-17 NOTE — BH Specialist Note (Signed)
Pt did not arrive to video visit and did not answer the phone; Left HIPPA-compliant message to call back [name] from [facility] at [phone], and left MyChart message for patients.     [place] via Telemedicine Video Visit  07/17/2019 Deanna [name] [id number]  [name]

## 2019-07-18 ENCOUNTER — Other Ambulatory Visit: Payer: Self-pay

## 2019-07-18 ENCOUNTER — Ambulatory Visit (HOSPITAL_COMMUNITY): Payer: BC Managed Care – PPO | Admitting: *Deleted

## 2019-07-18 ENCOUNTER — Ambulatory Visit (HOSPITAL_COMMUNITY)
Admission: RE | Admit: 2019-07-18 | Discharge: 2019-07-18 | Disposition: A | Discharge status: Home / Self Care | Payer: BC Managed Care – PPO | Source: Ambulatory Visit | Attending: Obstetrics | Admitting: Obstetrics

## 2019-07-18 ENCOUNTER — Encounter (HOSPITAL_COMMUNITY): Payer: Self-pay

## 2019-07-18 VITALS — BP 122/63 | HR 100 | Temp 98.5°F

## 2019-07-18 DIAGNOSIS — O99212 Obesity complicating pregnancy, second trimester: Secondary | ICD-10-CM

## 2019-07-18 DIAGNOSIS — O365990 Maternal care for other known or suspected poor fetal growth, unspecified trimester, not applicable or unspecified: Secondary | ICD-10-CM | POA: Diagnosis not present

## 2019-07-18 DIAGNOSIS — O0990 Supervision of high risk pregnancy, unspecified, unspecified trimester: Secondary | ICD-10-CM

## 2019-07-18 DIAGNOSIS — O99612 Diseases of the digestive system complicating pregnancy, second trimester: Secondary | ICD-10-CM | POA: Diagnosis not present

## 2019-07-18 DIAGNOSIS — O365920 Maternal care for other known or suspected poor fetal growth, second trimester, not applicable or unspecified: Secondary | ICD-10-CM

## 2019-07-18 DIAGNOSIS — O365930 Maternal care for other known or suspected poor fetal growth, third trimester, not applicable or unspecified: Secondary | ICD-10-CM

## 2019-07-18 DIAGNOSIS — Z3A28 28 weeks gestation of pregnancy: Secondary | ICD-10-CM

## 2019-07-18 NOTE — Procedures (Signed)
Deanna [name] Feb 01, 1996 [redacted]w[redacted]d Fetus A Non-Stress Test Interpretation for 07/18/19  Indication: IUGR  Fetal Heart Rate A Mode: External Baseline Rate (A): 140 bpm Variability: Moderate Accelerations: 15 x 15 Decelerations: None Multiple birth?: No  Uterine Activity Mode: Toco Contraction Frequency (min): none noted  Interpretation (Fetal Testing) Nonstress Test Interpretation: Reactive Comments: FHR tracing rev'd by Dr. FAnnamaria Boots

## 2019-07-25 ENCOUNTER — Other Ambulatory Visit: Payer: Self-pay

## 2019-07-25 ENCOUNTER — Ambulatory Visit (HOSPITAL_COMMUNITY): Payer: BC Managed Care – PPO | Admitting: *Deleted

## 2019-07-25 ENCOUNTER — Encounter (HOSPITAL_COMMUNITY): Payer: Self-pay

## 2019-07-25 ENCOUNTER — Ambulatory Visit (HOSPITAL_COMMUNITY)
Admission: RE | Admit: 2019-07-25 | Discharge: 2019-07-25 | Disposition: A | Discharge status: Home / Self Care | Payer: BC Managed Care – PPO | Source: Ambulatory Visit | Attending: Obstetrics | Admitting: Obstetrics

## 2019-07-25 ENCOUNTER — Ambulatory Visit (HOSPITAL_COMMUNITY): Payer: BC Managed Care – PPO

## 2019-07-25 VITALS — BP 129/72 | HR 109 | Temp 98.6°F

## 2019-07-25 DIAGNOSIS — Z3A29 29 weeks gestation of pregnancy: Secondary | ICD-10-CM

## 2019-07-25 DIAGNOSIS — O99613 Diseases of the digestive system complicating pregnancy, third trimester: Secondary | ICD-10-CM | POA: Diagnosis not present

## 2019-07-25 DIAGNOSIS — O365990 Maternal care for other known or suspected poor fetal growth, unspecified trimester, not applicable or unspecified: Secondary | ICD-10-CM | POA: Insufficient documentation

## 2019-07-25 DIAGNOSIS — O99213 Obesity complicating pregnancy, third trimester: Secondary | ICD-10-CM | POA: Diagnosis not present

## 2019-07-25 DIAGNOSIS — O365930 Maternal care for other known or suspected poor fetal growth, third trimester, not applicable or unspecified: Secondary | ICD-10-CM

## 2019-07-25 DIAGNOSIS — O0990 Supervision of high risk pregnancy, unspecified, unspecified trimester: Secondary | ICD-10-CM | POA: Insufficient documentation

## 2019-07-25 NOTE — Procedures (Signed)
Deanna [name] June 01, 1996 [redacted]w[redacted]d Fetus A Non-Stress Test Interpretation for 07/25/19  Indication: IUGR  Fetal Heart Rate A Mode: External Baseline Rate (A): 140 bpm Variability: Moderate Accelerations: 10 x 10 Decelerations: None Multiple birth?: No  Uterine Activity Mode: Palpation, Toco Contraction Frequency (min): None Resting Tone Palpated: Relaxed Resting Time: Adequate  Interpretation (Fetal Testing) Nonstress Test Interpretation: Reactive Overall Impression: Reassuring for gestational age Comments: EFM tracing reviewed by Dr. FAnnamaria Boots

## 2019-07-26 ENCOUNTER — Encounter: Payer: Self-pay | Admitting: Obstetrics and Gynecology

## 2019-07-26 ENCOUNTER — Ambulatory Visit (INDEPENDENT_AMBULATORY_CARE_PROVIDER_SITE_OTHER): Payer: BC Managed Care – PPO | Admitting: Obstetrics and Gynecology

## 2019-07-26 VITALS — BP 117/74 | HR 96 | Wt 244.7 lb

## 2019-07-26 DIAGNOSIS — R87613 High grade squamous intraepithelial lesion on cytologic smear of cervix (HGSIL): Secondary | ICD-10-CM

## 2019-07-26 DIAGNOSIS — O365930 Maternal care for other known or suspected poor fetal growth, third trimester, not applicable or unspecified: Secondary | ICD-10-CM

## 2019-07-26 DIAGNOSIS — O0993 Supervision of high risk pregnancy, unspecified, third trimester: Secondary | ICD-10-CM

## 2019-07-26 DIAGNOSIS — Z3A30 30 weeks gestation of pregnancy: Secondary | ICD-10-CM

## 2019-07-26 DIAGNOSIS — O0990 Supervision of high risk pregnancy, unspecified, unspecified trimester: Secondary | ICD-10-CM

## 2019-07-26 DIAGNOSIS — K50813 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: Secondary | ICD-10-CM

## 2019-07-26 NOTE — Progress Notes (Signed)
   PRENATAL VISIT NOTE  Subjective:  Deanna [name] is a 23 y.o. G3P1011 at 66w0dbeing seen today for ongoing prenatal care.  She is currently monitored for the following issues for this high-risk pregnancy and has Keloid; Acne vulgaris; 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[place]; 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[place]; Exposure to STD; Depression with anxiety; Supervision of high risk pregnancy, antepartum; HGSIL (high grade squamous intraepithelial lesion) on Pap smear of cervix; and IUGR (intrauterine growth restriction) affecting care of mother on their problem list.  Patient reports no complaints.  Contractions: Irritability. Vag. Bleeding: None.  Movement: Present. Denies leaking of fluid.   The following portions of the patient's history were reviewed and updated as appropriate: allergies, current medications, past family history, past medical history, past social history, past surgical history and problem list.   Objective:   Vitals:   07/26/19 1026  BP: 117/74  Pulse: 96  Weight: 244 lb 11.2 oz (111 kg)    Fetal Status: Fetal Heart Rate (bpm): 142 Fundal Height: 28 cm Movement: Present     General:  Alert, oriented and cooperative. Patient is in no acute distress.  Skin: Skin is warm and dry. No rash noted.   Cardiovascular: Normal heart rate noted  Respiratory: Normal respiratory effort, no problems with respiration noted  Abdomen: Soft, gravid, appropriate for gestational age.  Pain/Pressure: Present     Pelvic: Cervical exam deferred        Extremities: Normal range of motion.  Edema: Trace  Mental Status: Normal mood and affect. Normal behavior. Normal judgment and thought content.   Assessment and Plan:  Pregnancy: G3P1011 at 359w0d. Supervision of high risk pregnancy, antepartum Patient is doing well without complaints Reviewed normal glucola last visit  2. Poor fetal growth affecting management of mother in third trimester, single or  unspecified fetus Elevated S/D ration on 11/3 without absent or reverse flow Follow up next week  3. HGSIL (high grade squamous intraepithelial lesion) on Pap smear of cervix Patient given informed consent, signed copy in the chart, time out was performed.  Placed in lithotomy position. Cervix viewed with speculum and colposcope after application of acetic acid.   Colposcopy adequate?  yes [place]et[place] lesions? no Punctation? no Mosaicism?  no Abnormal vasculature?  no Biopsies? no ECC? No  Will repeat colpo postpartum   4. 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[place]  Preterm labor symptoms and general obstetric precautions including but not limited to vaginal bleeding, contractions, leaking of fluid and fetal movement were reviewed in detail with the patient. Please refer to After Visit Summary for other counseling recommendations.   Return in about 2 weeks (around 08/09/2019) for ROB.  Future Appointments  Date Time Provider [place]11/06/2019 10:45 AM [facility]ST [facility]  08/01/2019 11:30 AM WHE[place]FC-US  08/01/2019 11:30 AM WH-MFC [place] WH-MFCUS MFC-US  08/08/2019  1:15 PM KoStarr LakeCNM WOC-WOCA WOGenesee1/13/2021  2:45 PM DeBo MerinoMD CR-GSO None  10/24/2019  2:30 PM Deveshwar, ShAbel PrestoMD CR-GSO None    PeMora BellmanMD

## 2019-08-01 ENCOUNTER — Other Ambulatory Visit: Payer: Self-pay

## 2019-08-01 ENCOUNTER — Other Ambulatory Visit (HOSPITAL_COMMUNITY): Payer: Self-pay | Admitting: Obstetrics

## 2019-08-01 ENCOUNTER — Ambulatory Visit (HOSPITAL_COMMUNITY)
Admission: RE | Admit: 2019-08-01 | Discharge: 2019-08-01 | Disposition: A | Discharge status: Home / Self Care | Payer: BC Managed Care – PPO | Source: Ambulatory Visit | Attending: Obstetrics | Admitting: Obstetrics

## 2019-08-01 ENCOUNTER — Ambulatory Visit (HOSPITAL_COMMUNITY): Payer: BC Managed Care – PPO | Admitting: *Deleted

## 2019-08-01 ENCOUNTER — Encounter (HOSPITAL_COMMUNITY): Payer: Self-pay

## 2019-08-01 ENCOUNTER — Other Ambulatory Visit (HOSPITAL_COMMUNITY): Payer: Self-pay | Admitting: *Deleted

## 2019-08-01 VITALS — BP 109/73 | HR 101 | Temp 98.8°F

## 2019-08-01 DIAGNOSIS — O365990 Maternal care for other known or suspected poor fetal growth, unspecified trimester, not applicable or unspecified: Secondary | ICD-10-CM | POA: Insufficient documentation

## 2019-08-01 DIAGNOSIS — O0990 Supervision of high risk pregnancy, unspecified, unspecified trimester: Secondary | ICD-10-CM

## 2019-08-01 DIAGNOSIS — O365930 Maternal care for other known or suspected poor fetal growth, third trimester, not applicable or unspecified: Secondary | ICD-10-CM | POA: Diagnosis not present

## 2019-08-01 DIAGNOSIS — O99613 Diseases of the digestive system complicating pregnancy, third trimester: Secondary | ICD-10-CM

## 2019-08-01 DIAGNOSIS — O99213 Obesity complicating pregnancy, third trimester: Secondary | ICD-10-CM

## 2019-08-01 DIAGNOSIS — Z362 Encounter for other antenatal screening follow-up: Secondary | ICD-10-CM | POA: Diagnosis not present

## 2019-08-01 DIAGNOSIS — Z3A30 30 weeks gestation of pregnancy: Secondary | ICD-10-CM | POA: Diagnosis not present

## 2019-08-01 NOTE — Procedures (Signed)
Deanna [name] 1996/08/22 [redacted]w[redacted]d Fetus A Non-Stress Test Interpretation for 08/01/19  Indication: IUGR  Fetal Heart Rate A Mode: External Baseline Rate (A): 140 bpm Variability: Moderate Accelerations: 15 x 15 Decelerations: None Multiple birth?: No  Uterine Activity Mode: Palpation, Toco Contraction Frequency (min): None Resting Tone Palpated: Relaxed Resting Time: Adequate  Interpretation (Fetal Testing) Nonstress Test Interpretation: Reactive Comments: EFM tracing reviewed by Dr. SDonalee Citrin

## 2019-08-02 ENCOUNTER — Ambulatory Visit: Payer: BC Managed Care – PPO | Admitting: Clinical

## 2019-08-02 DIAGNOSIS — Z5329 Procedure and treatment not carried out because of patient's decision for other reasons: Secondary | ICD-10-CM

## 2019-08-02 DIAGNOSIS — Z91199 Patient's noncompliance with other medical treatment and regimen due to unspecified reason: Secondary | ICD-10-CM

## 2019-08-02 NOTE — BH Specialist Note (Signed)
Pt did not arrive to video visit and did not answer the phone; Left HIPPA-compliant message to call back Deanna [name] from [facility] at [phone], and left MyChart message for patient.  [facility] via Telemedicine Video Visit  08/02/2019 Deanna [name] [id number]   [name]  Depression screen [facility] 2/9 07/26/2019 06/19/2019 10/26/2018 07/07/2018 12/30/2016  Decreased Interest 1 2 3  0 0  Down, Depressed, Hopeless 1 1 2 1  0  PHQ - 2 Score 2 3 5 1  0  Altered sleeping 2 1 3  0 1  Tired, decreased energy 3 3 2  0 1  Change in appetite 1 1 3 1  0  Feeling bad or failure about yourself  1 1 2 1  0  Trouble concentrating 1 0 1 - 0  Moving slowly or fidgety/restless 0 1 - 2 0  Suicidal thoughts 0 0 0 0 0  PHQ-9 Score 10 10 16 5 2   Difficult doing work/chores - - Very difficult - -  Some recent data might be hidden   GAD 7 : Generalized Anxiety Score 07/26/2019 06/19/2019 10/26/2018 07/07/2018  Nervous, Anxious, on Edge 2 3 3  0  Control/stop worrying 2 1 3  0  Worry too much - different things 2 1 3 2   Trouble relaxing 2 3 3  0  Restless 1 1 3  0  Easily annoyed or irritable 1 3 1  0  Afraid - awful might happen 0 0 1 0  Total GAD 7 Score 10 12 17 2   Anxiety Difficulty - - Very difficult -

## 2019-08-03 ENCOUNTER — Other Ambulatory Visit: Payer: Self-pay

## 2019-08-04 ENCOUNTER — Ambulatory Visit (INDEPENDENT_AMBULATORY_CARE_PROVIDER_SITE_OTHER): Payer: BC Managed Care – PPO | Admitting: Family Medicine

## 2019-08-04 ENCOUNTER — Encounter: Payer: Self-pay | Admitting: Family Medicine

## 2019-08-04 DIAGNOSIS — R87613 High grade squamous intraepithelial lesion on cytologic smear of cervix (HGSIL): Secondary | ICD-10-CM | POA: Diagnosis not present

## 2019-08-04 DIAGNOSIS — M0880 Other juvenile arthritis, unspecified site: Secondary | ICD-10-CM | POA: Diagnosis not present

## 2019-08-04 DIAGNOSIS — K50813 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: Secondary | ICD-10-CM

## 2019-08-04 DIAGNOSIS — F418 Other specified anxiety disorders: Secondary | ICD-10-CM | POA: Diagnosis not present

## 2019-08-04 MED ORDER — ONDANSETRON 4 MG PO TBDP: 4.0000 mg | ORAL_TABLET | Freq: Three times a day (TID) | ORAL | 1 refills | Status: DC | PRN

## 2019-08-04 NOTE — Assessment & Plan Note (Signed)
UnFortunately I would not recommend going on any medication for her RA at this time.

## 2019-08-04 NOTE — Patient Instructions (Signed)
Get B6 candy or vitamins for nausea Take the zofran for nausea  I will get appointment  With GI I will discuss about your prozac  F/U pending

## 2019-08-04 NOTE — Assessment & Plan Note (Signed)
Think that she would benefit from being on medication for her anxiety.  Fortunately she has stopped the Prozac and it seems the recommendations are not as clear on when she can restart this as she is in her third trimester.  She may benefit from another SSRIs such as Zoloft or Lexapro.  I'm going to  Get  In contact with her OB/GYN to see which one they would prefer since they are monitoring her pregnancy.

## 2019-08-04 NOTE — Progress Notes (Signed)
Subjective:    Patient ID: Deanna [name], female    DOB: 01/17/1996, 23 y.o.   MRN: [id number]  Patient presents for Pregnancy Concerns (states that she has concerns about what OB is doing and would like to discuss with PCP)   Currently [redacted] weeks pregnant with IUGR, he also is high risk secondary to her rheumatoid arthritis and her underlying Crohn's disease.  She has had significant problems with trying to keep her appetite up and drink enough fluids and eat enough calories.  They think this is what is contributing to her IUGR.  She states that she has constant nausea and vomiting she had some reflux issues.  Her Crohn's seems to be always flared up   she has multiple bowel movements a day.  She has not been followed by gastroenterology or rheumatology throughout her pregnancy.  She was told that she could take nausea medicine but then she was afraid of the side effects.  There is also the underlying anxiety and depression she was on Prozac she was told that she could continue it but she actually stopped the medication.  Now she is interested in restarting.  She was being followed by therapist at her OB/GYN.  Her other concern was an abnormal Pap smear she was confused on whether or not she actually had cervical cancer what was going on.  I did review the chart seems that she had H SIL with positive HPV she had abnormal Pap smear with me a few years ago but did not follow-up.  She had colposcopy done by her OB/GYN which came back negative but recommends a repeat this after delivery in case she does need biopsy.    They are planning to induce her early 71-37 weeks         Pt mother with her    Review Of Systems:  GEN- denies fatigue, fever, weight loss,weakness, recent illness HEENT- denies eye drainage, change in vision, nasal discharge, CVS- denies chest pain, palpitations RESP- denies SOB, cough, wheeze ABD- denies N/V,+ change in stools, abd pain GU- denies dysuria, hematuria,  dribbling, incontinence MSK- denies joint pain, muscle aches, injury Neuro- denies headache, dizziness, syncope, seizure activity       Objective:    BP 110/62   Pulse (!) 110   Temp 98.2 F (36.8 C) (Temporal)   Resp 14   Ht 5' 7"  (1.702 m)   Wt 250 lb (113.4 kg)   LMP 12/05/2018 (Exact Date)   SpO2 98%   BMI 39.16 kg/m  GEN- NAD, alert and oriented x3 HEENT- PERRL, EOMI, non injected sclera, pink conjunctiva, MMM, oropharynx clear Neck- Supple, no thyromegaly CVS- RRR, no murmur RESP-CTAB ABD-NABS,soft, gravid uterus Psych- normal affect and mood EXT- No edema Pulses- Radial 2+        Assessment & Plan:      Problem List Items Addressed This Visit      Unprioritized   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[place])    If he had problems with her Crohn's disease in the setting of her pregnancy.  I will contact her gastroenterologist see we can get her urgent appointment see if there is any medication even low dose that can be used during her pregnancy to help with her appetite and bowel movements to improve her nutrition which would then help improve nutrition for her fetus. In the meantime I have given her Zofran to use for nausea.  She can also take B6  prenatal vitamins to help with nausea      Depression with anxiety    Think that she would benefit from being on medication for her anxiety.  Fortunately she has stopped the Prozac and it seems the recommendations are not as clear on when she can restart this as she is in her third trimester.  She may benefit from another SSRIs such as Zoloft or Lexapro.  I'm going to  Get  In contact with her OB/GYN to see which one they would prefer since they are monitoring her pregnancy.      HGSIL (high grade squamous intraepithelial lesion) on Pap smear of cervix    Discussed with patient that her colposcopy did come back negative but she should have this repeated postpartum at that point if there is an  abnormal lesion that can be biopsied safely.  She voiced understanding.      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[place])    UnFortunately I would not recommend going on any medication for her RA at this time.         Note: This dictation was prepared with Dragon dictation along with smaller phrase technology. Any transcriptional errors that result from this process are unintentional.

## 2019-08-04 NOTE — Assessment & Plan Note (Addendum)
If he had problems with her Crohn's disease in the setting of her pregnancy.  I will contact her gastroenterologist see we can get her urgent appointment see if there is any medication even low dose that can be used during her pregnancy to help with her appetite and bowel movements to improve her nutrition which would then help improve nutrition for her fetus. In the meantime I have given her Zofran to use for nausea.  She can also take B6 prenatal vitamins to help with nausea

## 2019-08-04 NOTE — Assessment & Plan Note (Signed)
Discussed with patient that her colposcopy did come back negative but she should have this repeated postpartum at that point if there is an abnormal lesion that can be biopsied safely.  She voiced understanding.

## 2019-08-07 ENCOUNTER — Telehealth: Payer: Self-pay

## 2019-08-07 ENCOUNTER — Other Ambulatory Visit: Payer: Self-pay | Admitting: Obstetrics and Gynecology

## 2019-08-07 DIAGNOSIS — F418 Other specified anxiety disorders: Secondary | ICD-10-CM

## 2019-08-07 MED ORDER — SERTRALINE HCL 50 MG PO TABS: 50.0000 mg | ORAL_TABLET | Freq: Every day | ORAL | 3 refills | Status: DC

## 2019-08-07 NOTE — Telephone Encounter (Signed)
Called pt to advise that her Rx Zoloft has been sent to her Pharmacy, no answer, left VM.

## 2019-08-07 NOTE — Telephone Encounter (Signed)
-----   Message from Mora Bellman, MD sent at 08/07/2019  8:10 AM EST ----- Regarding: FW: Restarting depression medication Please inform patient that Rx for zoloft has been e-prescribed   Peggy   ----- Message ----- From: [name], MD Sent: 08/04/2019   5:05 PM EST To: Mora Bellman, MD Subject: Restarting depression medication                   Hello,    I saw Deanna [name] today, she would like to restart her depression medication. I see she has been following with the therapist in your office. She was prescribed prozac 52m back in February but she has not taken at all during her pregnancy.  I am not sure if fluoxetine would be the best to restart in her third trimester or if you prefer another agent such as Zoloft or Lexapro.  I am open to suggestions.   She is willing to continue seeing the therapist there at your office.  I also reached out to her gastroenterologist secondary to her Crohn's disease in her pregnancy/IUGR, I am not sure if there is anything that she can take at this time.  I prescribed her zofran for nausea, states she is only able to eat very small amounts and even liquids are making her nauseous and want to vomit.  Also had her get B6 vitamins   [employer]

## 2019-08-08 ENCOUNTER — Encounter: Payer: BC Managed Care – PPO | Admitting: Student

## 2019-08-08 ENCOUNTER — Telehealth: Payer: Self-pay | Admitting: Family Medicine

## 2019-08-08 NOTE — Telephone Encounter (Signed)
Called and left a voice message for patient.  It appears her OB/GYN as well as GI has been trying to contact her.  OB would like to start her on Zoloft for her depression.  GI has been trying to get her scheduled for her Crohn's disease.  I was unable to leave a voice message on her mother's phone.

## 2019-08-09 ENCOUNTER — Other Ambulatory Visit: Payer: BC Managed Care – PPO

## 2019-08-09 ENCOUNTER — Ambulatory Visit (INDEPENDENT_AMBULATORY_CARE_PROVIDER_SITE_OTHER): Payer: BC Managed Care – PPO | Admitting: Obstetrics and Gynecology

## 2019-08-09 ENCOUNTER — Other Ambulatory Visit: Payer: Self-pay

## 2019-08-09 ENCOUNTER — Ambulatory Visit (INDEPENDENT_AMBULATORY_CARE_PROVIDER_SITE_OTHER): Payer: BC Managed Care – PPO | Admitting: Gastroenterology

## 2019-08-09 ENCOUNTER — Encounter: Payer: Self-pay | Admitting: Obstetrics and Gynecology

## 2019-08-09 ENCOUNTER — Ambulatory Visit: Payer: Self-pay

## 2019-08-09 ENCOUNTER — Ambulatory Visit (INDEPENDENT_AMBULATORY_CARE_PROVIDER_SITE_OTHER): Payer: BC Managed Care – PPO | Admitting: *Deleted

## 2019-08-09 ENCOUNTER — Encounter: Payer: Self-pay | Admitting: Gastroenterology

## 2019-08-09 VITALS — BP 110/70 | HR 130 | Temp 97.1°F | Ht 67.0 in | Wt 246.0 lb

## 2019-08-09 VITALS — BP 114/58 | HR 114 | Wt 250.0 lb

## 2019-08-09 DIAGNOSIS — K50119 Crohn's disease of large intestine with unspecified complications: Secondary | ICD-10-CM

## 2019-08-09 DIAGNOSIS — F418 Other specified anxiety disorders: Secondary | ICD-10-CM

## 2019-08-09 DIAGNOSIS — O365930 Maternal care for other known or suspected poor fetal growth, third trimester, not applicable or unspecified: Secondary | ICD-10-CM | POA: Diagnosis not present

## 2019-08-09 DIAGNOSIS — O0001 Abdominal pregnancy with intrauterine pregnancy: Secondary | ICD-10-CM | POA: Diagnosis not present

## 2019-08-09 DIAGNOSIS — O0990 Supervision of high risk pregnancy, unspecified, unspecified trimester: Secondary | ICD-10-CM

## 2019-08-09 DIAGNOSIS — K50813 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: Secondary | ICD-10-CM

## 2019-08-09 DIAGNOSIS — O365910 Maternal care for other known or suspected poor fetal growth, first trimester, not applicable or unspecified: Secondary | ICD-10-CM

## 2019-08-09 DIAGNOSIS — R87613 High grade squamous intraepithelial lesion on cytologic smear of cervix (HGSIL): Secondary | ICD-10-CM

## 2019-08-09 DIAGNOSIS — Z3A32 32 weeks gestation of pregnancy: Secondary | ICD-10-CM

## 2019-08-09 DIAGNOSIS — M0880 Other juvenile arthritis, unspecified site: Secondary | ICD-10-CM

## 2019-08-09 DIAGNOSIS — O0993 Supervision of high risk pregnancy, unspecified, third trimester: Secondary | ICD-10-CM

## 2019-08-09 NOTE — Patient Instructions (Signed)
Your provider has requested that you go to the basement level for lab work before leaving today. Press "B" on the elevator. The lab is located at the first door on the left as you exit the elevator.

## 2019-08-09 NOTE — Progress Notes (Signed)
   PRENATAL VISIT NOTE  Subjective:  Deanna [name] is a 23 y.o. G3P1011 at 61w0dbeing seen today for ongoing prenatal care.  She is currently monitored for the following issues for this high-risk pregnancy and has Keloid; Acne vulgaris; 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[name]; 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[name]; Exposure to STD; Depression with anxiety; Supervision of high risk pregnancy, antepartum; HGSIL (high grade squamous intraepithelial lesion) on Pap smear of cervix; and IUGR (intrauterine growth restriction) affecting care of mother on their problem list.  Patient reports no complaints.  Contractions: Not present. Vag. Bleeding: None.  Movement: Present. Denies leaking of fluid.   The following portions of the patient's history were reviewed and updated as appropriate: allergies, current medications, past family history, past medical history, past social history, past surgical history and problem list.   Objective:   Vitals:   08/09/19 1607  BP: (!) 114/58  Pulse: (!) 114  Weight: 250 lb (113.4 kg)    Fetal Status: Fetal Heart Rate (bpm): NST   Movement: Present     General:  Alert, oriented and cooperative. Patient is in no acute distress.  Skin: Skin is warm and dry. No rash noted.   Cardiovascular: Normal heart rate noted  Respiratory: Normal respiratory effort, no problems with respiration noted  Abdomen: Soft, gravid, appropriate for gestational age.  Pain/Pressure: Present     Pelvic: Cervical exam deferred        Extremities: Normal range of motion.     Mental Status: Normal mood and affect. Normal behavior. Normal judgment and thought content.   Assessment and Plan:  Pregnancy: G3P1011 at 325w0d1. Supervision of high risk pregnancy, antepartum Doing well  2. Poor fetal growth affecting management of mother in first trimester, single or unspecified fetus Last growth 8th%tile Normal dopplers Weekly BPP BPP today 10/10  3.  Depression with anxiety Started on zoloft 2 days ago Doing well, so far no issues  4. [name]  5. 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[name] [facility] this am, has not been followed during pregnancy For endoscopic eval after delivery  6. 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[name]) No recommendations for meds at this time per PCP   Preterm labor symptoms and general obstetric precautions including but not limited to vaginal bleeding, contractions, leaking of fluid and fetal movement were reviewed in detail with the patient. Please refer to After Visit Summary for other counseling recommendations.   Return in about 13 days (around 08/22/2019) [facility], [facility] [place] [facility] same day, cancel 12/4 appt.  Future Appointments  Date Time Provider DeYorkana11/24/2020  2:30 PM WHValley GroveSK[place] WH-MFCUS MFC-US  08/15/2019  2:40 PM WHGlenwoodURSE WHDeep CreekFC-US  08/22/2019  3:15 PM WHSpring LakeSK[place] WH-MFCUS MFC-US  08/22/2019  3:20 PM WHAuburnURSE WHEurekaFC-US  10/04/2019  2:45 PM Deanna MerinoMD CR-GSO None  10/24/2019  2:30 PM Deanna MerinoMD CR-GSO None    KeSloan LeiterMD

## 2019-08-09 NOTE — Progress Notes (Signed)
Dr. Laverta [name],  Referring Provider: [name], MD Primary Care Physician:  Deanna Rossetti, MD   Reason for Consultation: Crohn's disease   IMPRESSION:  Intrauterine pregnancy at 31 weeks complicated by IUGR Fistulizing Crohns Disease    -Diagnosed in 2012 when she presented with abdominal pain, bloody stools, and a 60 pound weight loss    - Colon biopsies 2013 showed normal random biopsies except for focal erosion at the IC valve       - Procedure performed at [name], procedure note not available in [name]       - Has been treated with Remicade, methotrexate, and Arava    - Off therapy for at least 3 years    - MRE 09/23/12:  severe inflammatory changes of the lower pelvis with multiple enteroenteric fistulas       - inflammatory changes of the terminal ileum, sigmoid colon, and mesentery  Juvenile idiopathic arthritis/psoriatic arthritis Depression and anxiety  Limited treatment options in the setting of intrauterine pregnancy.  Trial of oral steroids is appropriate if approved by her obstetrician.  Long-term management will be more challenging.  She may be best served by an IBD clinic at a tertiary care center.  We will plan postpartum colonoscopy to restage her disease.  She may ultimately need combination therapy with a biologic and immunomodulators.  We could premedicate her and consider a rechallenge with Remicade.  PLAN: - Fecal calprotectin - Prednisone 40 mg daily with a taper of 5 mg every 2 weeks - Endoscopic evaluation after her pregnancy - She will obtain her vaccination history from her mother - Return to clinic 2-4 weeks, or earlier as needed   HPI: Deanna [name] is a 23 y.o. female with a history of JIA/psoriatic arthritis and Crohn's disease previously on MTX and Arava.  She was seen in consultation 11/16/18. An MRE was performed in March. Did not keep telemedicine follow-up visit planned 12/15/18 or reschedule the appointment. I was contacted by Dr.  Buelah [name] who requested that she be seen for symptoms of Crohn's during pregnancy.  The interval history is obtained through the patient and review of her electronic health record.  She has depression and anxiety.  She is currently [redacted] weeks pregnant.   Previously followed at [name] seen there in 2015. Diagnosed with Crohn's disease in 2012 when she presented with diffuse, nonradiating abdominal pain, bloody stools, and a 60 pound weight loss.  Evaluation in 08/2012 included an EGD, colonoscopy, and abdominal MRI.  Unfortunately I'm unable to obtain any outpatient office note summarizing her care.  Although she had a colonoscopy in 2013 I am unable to find a copy of the procedure report.  I am able to see colon biopsies from a colonoscopy in 2013.  None of those biopsies showed active or chronic colitis except for focal surface epithelial erosion on the IC valve.  MR of the abdomen and pelvis with IBD protocol was performed at [name] 09/23/2012 and shows severe inflammatory changes of the lower pelvis with multiple Antero enteric fistulas with the possibility of an enteric colonic fistula involving the sigmoid colon.  There are inflammatory changes of the terminal ileum, sigmoid colon, and mesentery consistent with severe complicated Crohn's disease. and shows she was previously treated with infliximab but self discontinued the medication in December 2014.  She has been off and on methotrexate.  [name] was added in August 2015.  Her symptoms were well controlled on MTX and Arava.  She was also  treated with Bentyl.  However she has been off medications for 3 years.  She had no significant flare in her symptoms while pregnant with her son over 2 years ago.  She has tried to control any intermittent abdominal pain with acetaminophen.  In March, she had ongoing symptoms including primarily diffuse, nonradiating abdominal pain and associated diarrhea - stools are black and loose, fatigue and poor  appetite.  History of arthralgias involving the bilateral wrists, knees, and ankles. Ankles are better over the last year. Using Tylenol. Knows to avoid NSAIDs.  Not seeing a rheumatologist but she would like to establish with 1.   She is unaware of her prior vaccine history.  She does receive the seasonal flu vaccine.  She is not sure about other vaccines and would like to review this with her mother.  Vaping in high school.  Denies any ongoing alcohol, tobacco, vaping, marijuana, or street drug use.  She is currently studying social work at [employer].  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  She has a 52-year-old son.  She lives with her grandmother.  At the time of our last visit, I ordered an MRE performed 11/22/18 that showed:  A 7.8 cm segment of thickened and enhancing terminal ileum extends to the cecum, and likely represents moderately inflamed active Crohn's disease. There is potentially some scarring or cicatricial reaction in this vicinity drawing the cecum medially, in close association with a margin of the sigmoid colon which may be mildly secondarily inflamed and may be tethered. Given the reported history of fistula, it is difficult to exclude a luminal connection between the terminal ilium and the sigmoid colon at their point of contact. This could be further assessed with a dedicated contrast enema if clinically warranted.  Left ovarian cyst with simple characteristics.  Today she reports ongoing symptoms with continued diffuse, nonradiating abdominal pain.  There is associated diarrhea - stools are black/dark brown and loose.  Having at least 5 BM daily.   No bright red blood or mucus. Associated fatigue. Appetitie fluctuates.    She reports that her pregnancy is going well. Dr. Buelah [name] prescribed Ollen Bowl. Trying to eat more. Was not eating much before.  Review of epic shows IUGR.  Having legs, hips, and back pain. No other extra GI manifestations of IBD.   Her goal is to  breastfeed.      Past Medical History:  Diagnosis Date  . Anxiety   . 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 Crohn disease ([place])    dx in 2012  . Depression   . Infection    UTI x many    Past Surgical History:  Procedure Laterality Date  . COLONOSCOPY      Current Outpatient Medications  Medication Sig Dispense Refill  . acetaminophen (TYLENOL) 500 MG tablet Take 500 mg by mouth every 6 (six) hours as needed.    . ondansetron (ZOFRAN ODT) 4 MG disintegrating tablet Take 1 tablet (4 mg total) by mouth every 8 (eight) hours as needed for nausea or vomiting. 20 tablet 1  . Prenatal Vit-Fe Fumarate-FA (PRENATAL PO) Take by mouth.    . sertraline (ZOLOFT) 50 MG tablet Take 1 tablet (50 mg total) by mouth daily. 30 tablet 3   No current facility-administered medications for this visit.     Allergies as of 08/09/2019  . (No Known Allergies)    Family History  Problem Relation Age of Onset  . Diabetes Maternal Grandmother   . Gallstones  Mother   . Anxiety disorder Mother   . Depression Mother   . Stomach cancer Maternal Aunt   . Diabetes Maternal Grandfather   . Heart disease Maternal Grandfather   . Stroke Maternal Grandfather   . Colon cancer Maternal Grandfather     Social History   Socioeconomic History  . Marital status: Single    Spouse name: Not on file  . Number of children: 1  . Years of education: Not on file  . Highest education level: Not on file  Occupational History  . Occupation: Ship broker  Social Needs  . Financial resource strain: Not on file  . Food insecurity    Worry: Never true    Inability: Never true  . Transportation needs    Medical: No    Non-medical: No  Tobacco Use  . Smoking status: Former Smoker    Types: E-cigarettes, Cigarettes  . Smokeless tobacco: Never Used  . Tobacco comment: vaping in high school  Substance and Sexual Activity  . Alcohol use: No  . Drug use: No  . Sexual activity: Yes    Birth control/protection:  None  Lifestyle  . Physical activity    Days per week: Not on file    Minutes per session: Not on file  . Stress: Not on file  Relationships  . Social Herbalist on phone: Not on file    Gets together: Not on file    Attends religious service: Not on file    Active member of club or organization: Not on file    Attends meetings of clubs or organizations: Not on file    Relationship status: Not on file  . Intimate partner violence    Fear of current or ex partner: No    Emotionally abused: No    Physically abused: No    Forced sexual activity: No  Other Topics Concern  . Not on file  Social History Narrative  . Not on file    Review of Systems: 12 system ROS is negative except as noted above with the additions of anxiety, back pain, cough, depression, fatigue, headaches, insomnia.  The patient has no eye or vision complaints.  She has no urinary symptoms.Danley Danker Weights   08/09/19 1141  Weight: 246 lb (111.6 kg)    Physical Exam: Vital signs were reviewed. General:   Alert, well-nourished, pleasant and cooperative in NAD Head:  Normocephalic and atraumatic. Eyes:  Sclera clear, no icterus.   Conjunctiva pink. Mouth:  No deformity or lesions.   Neck:  Supple; no thyromegaly. Lungs:  Clear throughout to auscultation.   No wheezes.  Heart:  Regular rate and rhythm; no murmurs Abdomen: Gravid, soft, nontender, normal bowel sounds. No rebound or guarding. No hepatosplenomegaly Rectal:  Deferred  Msk:  Symmetrical without gross deformities. Extremities:  No gross deformities or edema. Neurologic:  Alert and  oriented x4;  grossly nonfocal Skin:  No rash or bruise. Psych:  Alert and cooperative. Normal mood and affect.   Shaina Gullatt L. Tarri Glenn, MD, MPH [facility] 08/09/2019, 11:47 AM

## 2019-08-15 ENCOUNTER — Ambulatory Visit (HOSPITAL_COMMUNITY): Payer: BC Managed Care – PPO | Admitting: *Deleted

## 2019-08-15 ENCOUNTER — Other Ambulatory Visit: Payer: Self-pay

## 2019-08-15 ENCOUNTER — Ambulatory Visit (HOSPITAL_COMMUNITY)
Admission: RE | Admit: 2019-08-15 | Discharge: 2019-08-15 | Disposition: A | Discharge status: Home / Self Care | Payer: BC Managed Care – PPO | Source: Ambulatory Visit | Attending: Obstetrics and Gynecology | Admitting: Obstetrics and Gynecology

## 2019-08-15 ENCOUNTER — Encounter (HOSPITAL_COMMUNITY): Payer: Self-pay | Admitting: *Deleted

## 2019-08-15 ENCOUNTER — Other Ambulatory Visit: Payer: BC Managed Care – PPO

## 2019-08-15 DIAGNOSIS — O365930 Maternal care for other known or suspected poor fetal growth, third trimester, not applicable or unspecified: Secondary | ICD-10-CM

## 2019-08-15 DIAGNOSIS — O99613 Diseases of the digestive system complicating pregnancy, third trimester: Secondary | ICD-10-CM

## 2019-08-15 DIAGNOSIS — O0990 Supervision of high risk pregnancy, unspecified, unspecified trimester: Secondary | ICD-10-CM | POA: Diagnosis not present

## 2019-08-15 DIAGNOSIS — O99213 Obesity complicating pregnancy, third trimester: Secondary | ICD-10-CM

## 2019-08-15 DIAGNOSIS — Z3A32 32 weeks gestation of pregnancy: Secondary | ICD-10-CM | POA: Diagnosis not present

## 2019-08-15 DIAGNOSIS — O365990 Maternal care for other known or suspected poor fetal growth, unspecified trimester, not applicable or unspecified: Secondary | ICD-10-CM | POA: Insufficient documentation

## 2019-08-15 DIAGNOSIS — K50119 Crohn's disease of large intestine with unspecified complications: Secondary | ICD-10-CM

## 2019-08-19 LAB — CALPROTECTIN, FECAL: Calprotectin, Fecal: 466 ug/g — ABNORMAL HIGH (ref 0–120)

## 2019-08-22 ENCOUNTER — Encounter (HOSPITAL_COMMUNITY): Payer: Self-pay | Admitting: *Deleted

## 2019-08-22 ENCOUNTER — Ambulatory Visit (HOSPITAL_COMMUNITY)
Admission: RE | Admit: 2019-08-22 | Discharge: 2019-08-22 | Disposition: A | Discharge status: Home / Self Care | Payer: BC Managed Care – PPO | Source: Ambulatory Visit | Attending: Obstetrics and Gynecology | Admitting: Obstetrics and Gynecology

## 2019-08-22 ENCOUNTER — Other Ambulatory Visit: Payer: Self-pay

## 2019-08-22 ENCOUNTER — Ambulatory Visit (HOSPITAL_COMMUNITY): Payer: BC Managed Care – PPO | Admitting: *Deleted

## 2019-08-22 ENCOUNTER — Ambulatory Visit (INDEPENDENT_AMBULATORY_CARE_PROVIDER_SITE_OTHER): Payer: BC Managed Care – PPO | Admitting: Advanced Practice Midwife

## 2019-08-22 ENCOUNTER — Encounter: Payer: Self-pay | Admitting: *Deleted

## 2019-08-22 VITALS — BP 123/63 | HR 105 | Wt 248.9 lb

## 2019-08-22 DIAGNOSIS — O0993 Supervision of high risk pregnancy, unspecified, third trimester: Secondary | ICD-10-CM | POA: Diagnosis not present

## 2019-08-22 DIAGNOSIS — O365930 Maternal care for other known or suspected poor fetal growth, third trimester, not applicable or unspecified: Secondary | ICD-10-CM

## 2019-08-22 DIAGNOSIS — O0990 Supervision of high risk pregnancy, unspecified, unspecified trimester: Secondary | ICD-10-CM | POA: Insufficient documentation

## 2019-08-22 DIAGNOSIS — Z3A33 33 weeks gestation of pregnancy: Secondary | ICD-10-CM

## 2019-08-22 DIAGNOSIS — Z362 Encounter for other antenatal screening follow-up: Secondary | ICD-10-CM | POA: Diagnosis not present

## 2019-08-22 DIAGNOSIS — O365990 Maternal care for other known or suspected poor fetal growth, unspecified trimester, not applicable or unspecified: Secondary | ICD-10-CM | POA: Diagnosis not present

## 2019-08-22 DIAGNOSIS — O99613 Diseases of the digestive system complicating pregnancy, third trimester: Secondary | ICD-10-CM | POA: Diagnosis not present

## 2019-08-22 DIAGNOSIS — O99213 Obesity complicating pregnancy, third trimester: Secondary | ICD-10-CM

## 2019-08-22 NOTE — Progress Notes (Signed)
   PRENATAL VISIT NOTE  Subjective:  Deanna [name] is a 23 y.o. G3P1011 at 2w6dbeing seen today for ongoing prenatal care.  She is currently monitored for the following issues for this high-risk pregnancy and has Keloid; Acne vulgaris; 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HF[place]; 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HMillers[place]; Exposure to STD; Depression with anxiety; Supervision of high risk pregnancy, antepartum; HGSIL (high grade squamous intraepithelial lesion) on Pap smear of cervix; and IUGR (intrauterine growth restriction) affecting care of mother on their problem list.  Patient reports no complaints.  Contractions: Not present. Vag. Bleeding: None.  Movement: Present. Denies leaking of fluid.   The following portions of the patient's history were reviewed and updated as appropriate: allergies, current medications, past family history, past medical history, past social history, past surgical history and problem list. Problem list updated.  Objective:   Vitals:   08/22/19 1423  BP: 123/63  Pulse: (!) 105  Weight: 248 lb 14.4 oz (112.9 kg)    Fetal Status: Fetal Heart Rate (bpm): NST   Movement: Present  Presentation: Vertex  General:  Alert, oriented and cooperative. Patient is in no acute distress.  Skin: Skin is warm and dry. No rash noted.   Cardiovascular: Normal heart rate noted  Respiratory: Normal respiratory effort, no problems with respiration noted  Abdomen: Soft, gravid, appropriate for gestational age.  Pain/Pressure: Absent     Pelvic: Cervical exam deferred        Extremities: Normal range of motion.  Edema: Trace  Mental Status: Normal mood and affect. Normal behavior. Normal judgment and thought content.   Assessment and Plan:  Pregnancy: G3P1011 at 328w6d1. Supervision of high risk pregnancy, antepartum --Biweekly testing previously initiated/scheduled --In person for 36 week swabs next visit  2. Poor fetal growth affecting management  of mother in third trimester, single or unspecified fetus - Fetal nonstress test  Normal - Reassuring surveillance in MFM: EFW 5% today, BPP 8/8  Preterm labor symptoms and general obstetric precautions including but not limited to vaginal bleeding, contractions, leaking of fluid and fetal movement were reviewed in detail with the patient. Please refer to After Visit Summary for other counseling recommendations.  Return in about 2 weeks (around 09/05/2019) [facility]  Future Appointments  Date Time Provider DeYancey12/07/2019 11:30 AM WHLucamaSKorea WH-MFCUS MFC-US  09/01/2019 11:40 AM WHOoliticURSE WHKirkwoodFC-US  09/06/2019  4:15 PM StTruett MainlandDO WOC-WOCA WORoxie12/18/2020 11:30 AM WHSouthbridgeSKorea WH-MFCUS MFC-US  09/08/2019 11:40 AM WH-MFC NURSE WH-MFC MFC-US  09/18/2019 11:15 AM WH-MFC [place] WH-MFCUS MFC-US  09/18/2019 11:20 AM WH-MFC NURSE WH-MFC MFC-US  10/04/2019  2:45 PM DeBo MerinoMD CR-GSO None  10/24/2019  2:30 PM DeBo MerinoMD CR-GSO None    SaDarlina RumpfCNM

## 2019-08-22 NOTE — Progress Notes (Signed)
[place] for growth/BPP and UA cord doppler @ MFM today.  Pt reports some occasional H/A - mostly relieved with tylenol.

## 2019-08-22 NOTE — Patient Instructions (Signed)

## 2019-08-23 ENCOUNTER — Other Ambulatory Visit (HOSPITAL_COMMUNITY): Payer: Self-pay | Admitting: *Deleted

## 2019-08-23 DIAGNOSIS — O365930 Maternal care for other known or suspected poor fetal growth, third trimester, not applicable or unspecified: Secondary | ICD-10-CM

## 2019-08-25 ENCOUNTER — Encounter: Payer: BC Managed Care – PPO | Admitting: Obstetrics and Gynecology

## 2019-09-01 ENCOUNTER — Encounter (HOSPITAL_COMMUNITY): Payer: Self-pay

## 2019-09-01 ENCOUNTER — Other Ambulatory Visit: Payer: Self-pay

## 2019-09-01 ENCOUNTER — Ambulatory Visit: Payer: BC Managed Care – PPO | Admitting: Family Medicine

## 2019-09-01 ENCOUNTER — Ambulatory Visit (HOSPITAL_COMMUNITY)
Admission: RE | Admit: 2019-09-01 | Discharge: 2019-09-01 | Disposition: A | Discharge status: Home / Self Care | Payer: BC Managed Care – PPO | Source: Ambulatory Visit | Attending: Obstetrics and Gynecology | Admitting: Obstetrics and Gynecology

## 2019-09-01 ENCOUNTER — Ambulatory Visit (HOSPITAL_COMMUNITY): Payer: BC Managed Care – PPO | Admitting: *Deleted

## 2019-09-01 DIAGNOSIS — Z3A35 35 weeks gestation of pregnancy: Secondary | ICD-10-CM

## 2019-09-01 DIAGNOSIS — O99613 Diseases of the digestive system complicating pregnancy, third trimester: Secondary | ICD-10-CM

## 2019-09-01 DIAGNOSIS — O99213 Obesity complicating pregnancy, third trimester: Secondary | ICD-10-CM | POA: Diagnosis not present

## 2019-09-01 DIAGNOSIS — O0990 Supervision of high risk pregnancy, unspecified, unspecified trimester: Secondary | ICD-10-CM | POA: Insufficient documentation

## 2019-09-01 DIAGNOSIS — O365930 Maternal care for other known or suspected poor fetal growth, third trimester, not applicable or unspecified: Secondary | ICD-10-CM

## 2019-09-04 ENCOUNTER — Ambulatory Visit: Payer: BC Managed Care – PPO | Admitting: Family Medicine

## 2019-09-06 ENCOUNTER — Ambulatory Visit (INDEPENDENT_AMBULATORY_CARE_PROVIDER_SITE_OTHER): Payer: BC Managed Care – PPO | Admitting: Obstetrics & Gynecology

## 2019-09-06 ENCOUNTER — Other Ambulatory Visit: Payer: Self-pay

## 2019-09-06 VITALS — BP 123/79 | HR 111 | Wt 254.0 lb

## 2019-09-06 DIAGNOSIS — O0990 Supervision of high risk pregnancy, unspecified, unspecified trimester: Secondary | ICD-10-CM

## 2019-09-06 DIAGNOSIS — O99210 Obesity complicating pregnancy, unspecified trimester: Secondary | ICD-10-CM

## 2019-09-06 DIAGNOSIS — Z3A36 36 weeks gestation of pregnancy: Secondary | ICD-10-CM

## 2019-09-06 DIAGNOSIS — O0993 Supervision of high risk pregnancy, unspecified, third trimester: Secondary | ICD-10-CM

## 2019-09-06 DIAGNOSIS — Z113 Encounter for screening for infections with a predominantly sexual mode of transmission: Secondary | ICD-10-CM | POA: Diagnosis not present

## 2019-09-06 DIAGNOSIS — O365910 Maternal care for other known or suspected poor fetal growth, first trimester, not applicable or unspecified: Secondary | ICD-10-CM

## 2019-09-06 DIAGNOSIS — O365930 Maternal care for other known or suspected poor fetal growth, third trimester, not applicable or unspecified: Secondary | ICD-10-CM

## 2019-09-06 DIAGNOSIS — Z8759 Personal history of other complications of pregnancy, childbirth and the puerperium: Secondary | ICD-10-CM | POA: Insufficient documentation

## 2019-09-06 DIAGNOSIS — O99213 Obesity complicating pregnancy, third trimester: Secondary | ICD-10-CM

## 2019-09-06 NOTE — Progress Notes (Signed)
   PRENATAL VISIT NOTE  Subjective:  Deanna [name] is a 23 y.o. G3P1011 at 57w0dbeing seen today for ongoing prenatal care.  She is currently monitored for the following issues for this high-risk pregnancy and has Keloid; Acne vulgaris; 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HUrb[place]; 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HBowman[place]; Exposure to STD; Depression with anxiety; Supervision of high risk pregnancy, antepartum; HGSIL (high grade squamous intraepithelial lesion) on Pap smear of cervix; IUGR (intrauterine growth restriction) affecting care of mother; and Obesity in pregnancy on their problem list.  Patient reports no complaints.  Contractions: Not present. Vag. Bleeding: None.  Movement: Present. Denies leaking of fluid.   The following portions of the patient's history were reviewed and updated as appropriate: allergies, current medications, past family history, past medical history, past social history, past surgical history and problem list.   Objective:   Vitals:   09/06/19 1609  BP: 123/79  Pulse: (!) 111  Weight: 254 lb (115.2 kg)    Fetal Status: Fetal Heart Rate (bpm): 154   Movement: Present     General:  Alert, oriented and cooperative. Patient is in no acute distress.  Skin: Skin is warm and dry. No rash noted.   Cardiovascular: Normal heart rate noted  Respiratory: Normal respiratory effort, no problems with respiration noted  Abdomen: Soft, gravid, appropriate for gestational age.  Pain/Pressure: Absent     Pelvic: Cervical exam performed        Extremities: Normal range of motion.  Edema: Trace  Mental Status: Normal mood and affect. Normal behavior. Normal judgment and thought content.   Assessment and Plan:  Pregnancy: G3P1011 at [redacted]w[redacted]d. Supervision of high risk pregnancy, antepartum  - Culture, beta strep (group b only) - Cervicovaginal ancillary only( COWood[place] 2. Poor fetal growth affecting management of mother in first trimester,  single or unspecified fetus - getting weekly BPP and dopplers with MFM 3. Obesity in pregnancy  4. H/O rapid labor  Preterm labor symptoms and general obstetric precautions including but not limited to vaginal bleeding, contractions, leaking of fluid and fetal movement were reviewed in detail with the patient. Please refer to After Visit Summary for other counseling recommendations.   No follow-ups on file.  Future Appointments  Date Time Provider DeGladstone12/18/2020 11:30 AM WHMonroeSKorea WH-MFCUS MFC-US  09/08/2019 11:40 AM WH-MFC NURSE WHPort OrfordFC-US  09/18/2019 11:15 AM WH-MFC [place] WH-MFCUS MFC-US  09/18/2019 11:20 AM WH-MFC NURSE WH-MFC MFC-US  10/04/2019  2:45 PM DeBo MerinoMD CR-GSO None  10/24/2019  2:30 PM Deveshwar, ShAbel PrestoMD CR-GSO None    MyEmily FilbertMD

## 2019-09-07 LAB — CERVICOVAGINAL ANCILLARY ONLY
Chlamydia: NEGATIVE
Comment: NEGATIVE
Comment: NORMAL
Neisseria Gonorrhea: NEGATIVE

## 2019-09-08 ENCOUNTER — Encounter (HOSPITAL_COMMUNITY): Payer: Self-pay

## 2019-09-08 ENCOUNTER — Ambulatory Visit (HOSPITAL_COMMUNITY)
Admission: RE | Admit: 2019-09-08 | Discharge: 2019-09-08 | Disposition: A | Discharge status: Home / Self Care | Payer: BC Managed Care – PPO | Source: Ambulatory Visit | Attending: Obstetrics and Gynecology | Admitting: Obstetrics and Gynecology

## 2019-09-08 ENCOUNTER — Other Ambulatory Visit: Payer: Self-pay

## 2019-09-08 ENCOUNTER — Ambulatory Visit (HOSPITAL_COMMUNITY): Payer: BC Managed Care – PPO | Admitting: *Deleted

## 2019-09-08 DIAGNOSIS — Z8759 Personal history of other complications of pregnancy, childbirth and the puerperium: Secondary | ICD-10-CM | POA: Diagnosis not present

## 2019-09-08 DIAGNOSIS — O365931 Maternal care for other known or suspected poor fetal growth, third trimester, fetus 1: Secondary | ICD-10-CM | POA: Diagnosis not present

## 2019-09-08 DIAGNOSIS — Z3A36 36 weeks gestation of pregnancy: Secondary | ICD-10-CM | POA: Diagnosis not present

## 2019-09-08 DIAGNOSIS — O0990 Supervision of high risk pregnancy, unspecified, unspecified trimester: Secondary | ICD-10-CM | POA: Insufficient documentation

## 2019-09-08 DIAGNOSIS — O365930 Maternal care for other known or suspected poor fetal growth, third trimester, not applicable or unspecified: Secondary | ICD-10-CM | POA: Diagnosis not present

## 2019-09-08 DIAGNOSIS — O99613 Diseases of the digestive system complicating pregnancy, third trimester: Secondary | ICD-10-CM

## 2019-09-08 DIAGNOSIS — O99213 Obesity complicating pregnancy, third trimester: Secondary | ICD-10-CM | POA: Diagnosis not present

## 2019-09-09 ENCOUNTER — Inpatient Hospital Stay (HOSPITAL_COMMUNITY)
Admission: AD | Admit: 2019-09-09 | Discharge: 2019-09-09 | Disposition: A | Discharge status: Home / Self Care | Payer: BC Managed Care – PPO | Attending: Obstetrics and Gynecology | Admitting: Obstetrics and Gynecology

## 2019-09-09 ENCOUNTER — Encounter (HOSPITAL_COMMUNITY): Payer: Self-pay | Admitting: Obstetrics and Gynecology

## 2019-09-09 ENCOUNTER — Other Ambulatory Visit: Payer: Self-pay

## 2019-09-09 DIAGNOSIS — O4703 False labor before 37 completed weeks of gestation, third trimester: Secondary | ICD-10-CM

## 2019-09-09 DIAGNOSIS — Z3A36 36 weeks gestation of pregnancy: Secondary | ICD-10-CM | POA: Insufficient documentation

## 2019-09-09 DIAGNOSIS — Z8759 Personal history of other complications of pregnancy, childbirth and the puerperium: Secondary | ICD-10-CM

## 2019-09-09 DIAGNOSIS — O479 False labor, unspecified: Secondary | ICD-10-CM

## 2019-09-09 DIAGNOSIS — O0990 Supervision of high risk pregnancy, unspecified, unspecified trimester: Secondary | ICD-10-CM

## 2019-09-09 NOTE — MAU Provider Note (Signed)
None      S: Ms. GLORIA RICARDO is a 23 y.o. G3P1011 at [redacted]w[redacted]d who presents to MAU today complaining of irregular contractions since 0600. She denies vaginal bleeding. She denies LOF. She reports normal fetal movement.    O: BP 118/83 (BP Location: Left Arm)   Pulse 97   Temp 97.9 F (36.6 C) (Oral)   Resp 18   LMP 12/05/2018 (Exact Date)   SpO2 100%  GENERAL: Well-developed, well-nourished female in no acute distress.  HEAD: Normocephalic, atraumatic.  CHEST: Normal effort of breathing, regular heart rate ABDOMEN: Soft, nontender, gravid  Cervical exam:  Dilation: 3 Effacement (%): Thick Station: Ballotable Exam by:: AJannifer Franklin RN   Fetal Monitoring: Baseline: 150 Variability: mod Accelerations: present Decelerations: absent Contractions: irregular   A: SIUP at 318w3dFalse labor  P: -Discharge home with labor precautions -Keep appt on 12/22  KoStarr LakeCNSpring Valley Lake2/19/2020 10:42 AM

## 2019-09-09 NOTE — MAU Note (Signed)
Deanna [name] is a 23 y.o. at 19w3dhere in MAU reporting: irregular uc's since 0600, denies bleeding and LOF. +FM. Also has a headache  Onset of complaint: today  Pain score: contractions 5/10, headache 2/10  Vitals:   09/09/19 0912  BP: 118/83  Pulse: 97  Resp: 18  Temp: 97.9 F (36.6 C)  SpO2: 100%     Lab orders placed from triage: none

## 2019-09-09 NOTE — Discharge Instructions (Signed)

## 2019-09-10 LAB — CULTURE, BETA STREP (GROUP B ONLY): Strep Gp B Culture: NEGATIVE

## 2019-09-11 ENCOUNTER — Inpatient Hospital Stay (HOSPITAL_COMMUNITY): Payer: BC Managed Care – PPO | Admitting: Anesthesiology

## 2019-09-11 ENCOUNTER — Other Ambulatory Visit: Payer: Self-pay

## 2019-09-11 ENCOUNTER — Inpatient Hospital Stay (HOSPITAL_COMMUNITY)
Admission: AD | Admit: 2019-09-11 | Discharge: 2019-09-13 | DRG: 805 | Disposition: A | Discharge status: Home / Self Care | Payer: BC Managed Care – PPO | Attending: Family Medicine | Admitting: Family Medicine

## 2019-09-11 ENCOUNTER — Encounter (HOSPITAL_COMMUNITY): Payer: Self-pay | Admitting: Obstetrics & Gynecology

## 2019-09-11 ENCOUNTER — Encounter: Payer: Self-pay | Admitting: *Deleted

## 2019-09-11 DIAGNOSIS — O42913 Preterm premature rupture of membranes, unspecified as to length of time between rupture and onset of labor, third trimester: Secondary | ICD-10-CM | POA: Diagnosis present

## 2019-09-11 DIAGNOSIS — O9962 Diseases of the digestive system complicating childbirth: Secondary | ICD-10-CM | POA: Diagnosis present

## 2019-09-11 DIAGNOSIS — Z87891 Personal history of nicotine dependence: Secondary | ICD-10-CM | POA: Diagnosis not present

## 2019-09-11 DIAGNOSIS — Z30017 Encounter for initial prescription of implantable subdermal contraceptive: Secondary | ICD-10-CM

## 2019-09-11 DIAGNOSIS — O365930 Maternal care for other known or suspected poor fetal growth, third trimester, not applicable or unspecified: Secondary | ICD-10-CM | POA: Diagnosis present

## 2019-09-11 DIAGNOSIS — O99344 Other mental disorders complicating childbirth: Secondary | ICD-10-CM | POA: Diagnosis present

## 2019-09-11 DIAGNOSIS — O42919 Preterm premature rupture of membranes, unspecified as to length of time between rupture and onset of labor, unspecified trimester: Secondary | ICD-10-CM | POA: Diagnosis present

## 2019-09-11 DIAGNOSIS — Z20828 Contact with and (suspected) exposure to other viral communicable diseases: Secondary | ICD-10-CM | POA: Diagnosis present

## 2019-09-11 DIAGNOSIS — F419 Anxiety disorder, unspecified: Secondary | ICD-10-CM | POA: Diagnosis present

## 2019-09-11 DIAGNOSIS — Z3A36 36 weeks gestation of pregnancy: Secondary | ICD-10-CM

## 2019-09-11 DIAGNOSIS — K5090 Crohn's disease, unspecified, without complications: Secondary | ICD-10-CM | POA: Diagnosis present

## 2019-09-11 DIAGNOSIS — O42013 Preterm premature rupture of membranes, onset of labor within 24 hours of rupture, third trimester: Secondary | ICD-10-CM

## 2019-09-11 LAB — TYPE AND SCREEN
ABO/RH(D): O POS
Antibody Screen: NEGATIVE

## 2019-09-11 LAB — RESPIRATORY PANEL BY RT PCR (FLU A&B, COVID)
Influenza A by PCR: NEGATIVE
Influenza B by PCR: NEGATIVE
SARS Coronavirus 2 by RT PCR: NEGATIVE

## 2019-09-11 LAB — POCT FERN TEST: POCT Fern Test: POSITIVE

## 2019-09-11 LAB — CBC
HCT: 36.9 % (ref 36.0–46.0)
Hemoglobin: 12.4 g/dL (ref 12.0–15.0)
MCH: 30.2 pg (ref 26.0–34.0)
MCHC: 33.6 g/dL (ref 30.0–36.0)
MCV: 90 fL (ref 80.0–100.0)
Platelets: 199 10*3/uL (ref 150–400)
RBC: 4.1 MIL/uL (ref 3.87–5.11)
RDW: 13.2 % (ref 11.5–15.5)
WBC: 10.9 10*3/uL — ABNORMAL HIGH (ref 4.0–10.5)
nRBC: 0 % (ref 0.0–0.2)

## 2019-09-11 LAB — RPR: RPR Ser Ql: NONREACTIVE

## 2019-09-11 MED ORDER — LACTATED RINGERS IV SOLN: 500.0000 mL | INTRAVENOUS | Status: DC | PRN

## 2019-09-11 MED ORDER — IBUPROFEN 600 MG PO TABS
600.0000 mg | ORAL_TABLET | Freq: Four times a day (QID) | ORAL | Status: DC
  Administered 2019-09-11 – 2019-09-13 (×8): 600 mg via ORAL
  Filled 2019-09-11 (×8): qty 1

## 2019-09-11 MED ORDER — SOD CITRATE-CITRIC ACID 500-334 MG/5ML PO SOLN: 30.0000 mL | ORAL | Status: DC | PRN

## 2019-09-11 MED ORDER — SENNOSIDES-DOCUSATE SODIUM 8.6-50 MG PO TABS
2.0000 | ORAL_TABLET | ORAL | Status: DC
  Administered 2019-09-12 (×2): 2 via ORAL
  Filled 2019-09-11 (×2): qty 2

## 2019-09-11 MED ORDER — PHENYLEPHRINE 40 MCG/ML (10ML) SYRINGE FOR IV PUSH (FOR BLOOD PRESSURE SUPPORT): 80.0000 ug | PREFILLED_SYRINGE | INTRAVENOUS | Status: DC | PRN

## 2019-09-11 MED ORDER — TETANUS-DIPHTH-ACELL PERTUSSIS 5-2.5-18.5 LF-MCG/0.5 IM SUSP: 0.5000 mL | Freq: Once | INTRAMUSCULAR | Status: DC

## 2019-09-11 MED ORDER — SERTRALINE HCL 50 MG PO TABS
50.0000 mg | ORAL_TABLET | Freq: Every day | ORAL | Status: DC
  Administered 2019-09-12 – 2019-09-13 (×2): 50 mg via ORAL
  Filled 2019-09-11 (×2): qty 1

## 2019-09-11 MED ORDER — PRENATAL MULTIVITAMIN CH
1.0000 | ORAL_TABLET | Freq: Every day | ORAL | Status: DC
  Administered 2019-09-12 – 2019-09-13 (×2): 1 via ORAL
  Filled 2019-09-11 (×2): qty 1

## 2019-09-11 MED ORDER — EPHEDRINE 5 MG/ML INJ: 10.0000 mg | INTRAVENOUS | Status: DC | PRN

## 2019-09-11 MED ORDER — ZOLPIDEM TARTRATE 5 MG PO TABS: 5.0000 mg | ORAL_TABLET | Freq: Every evening | ORAL | Status: DC | PRN

## 2019-09-11 MED ORDER — LACTATED RINGERS IV SOLN
INTRAVENOUS | Status: DC
  Administered 2019-09-11: 125 mL/h via INTRAVENOUS

## 2019-09-11 MED ORDER — SODIUM CHLORIDE (PF) 0.9 % IJ SOLN
INTRAMUSCULAR | Status: DC | PRN
  Administered 2019-09-11: 12 mL/h via EPIDURAL

## 2019-09-11 MED ORDER — OXYTOCIN 40 UNITS IN NORMAL SALINE INFUSION - SIMPLE MED
1.0000 m[IU]/min | INTRAVENOUS | Status: DC
  Administered 2019-09-11: 2 m[IU]/min via INTRAVENOUS

## 2019-09-11 MED ORDER — BENZOCAINE-MENTHOL 20-0.5 % EX AERO
1.0000 "application " | INHALATION_SPRAY | CUTANEOUS | Status: DC | PRN
  Administered 2019-09-11: 1 via TOPICAL
  Filled 2019-09-11: qty 56

## 2019-09-11 MED ORDER — OXYTOCIN 40 UNITS IN NORMAL SALINE INFUSION - SIMPLE MED
2.5000 [IU]/h | INTRAVENOUS | Status: DC
  Filled 2019-09-11: qty 1000

## 2019-09-11 MED ORDER — SIMETHICONE 80 MG PO CHEW: 80.0000 mg | CHEWABLE_TABLET | ORAL | Status: DC | PRN

## 2019-09-11 MED ORDER — COCONUT OIL OIL: 1.0000 "application " | TOPICAL_OIL | Status: DC | PRN

## 2019-09-11 MED ORDER — ONDANSETRON HCL 4 MG/2ML IJ SOLN: 4.0000 mg | INTRAMUSCULAR | Status: DC | PRN

## 2019-09-11 MED ORDER — ACETAMINOPHEN 325 MG PO TABS: 650.0000 mg | ORAL_TABLET | ORAL | Status: DC | PRN

## 2019-09-11 MED ORDER — WITCH HAZEL-GLYCERIN EX PADS: 1.0000 "application " | MEDICATED_PAD | CUTANEOUS | Status: DC | PRN

## 2019-09-11 MED ORDER — LACTATED RINGERS IV SOLN
500.0000 mL | Freq: Once | INTRAVENOUS | Status: AC
  Administered 2019-09-11: 500 mL via INTRAVENOUS

## 2019-09-11 MED ORDER — FENTANYL-BUPIVACAINE-NACL 0.5-0.125-0.9 MG/250ML-% EP SOLN: 12.0000 mL/h | EPIDURAL | Status: DC | PRN

## 2019-09-11 MED ORDER — DIBUCAINE (PERIANAL) 1 % EX OINT: 1.0000 "application " | TOPICAL_OINTMENT | CUTANEOUS | Status: DC | PRN

## 2019-09-11 MED ORDER — ONDANSETRON HCL 4 MG/2ML IJ SOLN
4.0000 mg | Freq: Four times a day (QID) | INTRAMUSCULAR | Status: DC | PRN
  Administered 2019-09-11: 4 mg via INTRAVENOUS
  Filled 2019-09-11: qty 2

## 2019-09-11 MED ORDER — ONDANSETRON HCL 4 MG PO TABS: 4.0000 mg | ORAL_TABLET | ORAL | Status: DC | PRN

## 2019-09-11 MED ORDER — LIDOCAINE HCL (PF) 1 % IJ SOLN
30.0000 mL | INTRAMUSCULAR | Status: AC | PRN
  Administered 2019-09-11: 3 mL via SUBCUTANEOUS
  Administered 2019-09-11: 5 mL via SUBCUTANEOUS
  Administered 2019-09-11: 2 mL via SUBCUTANEOUS

## 2019-09-11 MED ORDER — TERBUTALINE SULFATE 1 MG/ML IJ SOLN: 0.2500 mg | Freq: Once | INTRAMUSCULAR | Status: DC | PRN

## 2019-09-11 MED ORDER — DIPHENHYDRAMINE HCL 50 MG/ML IJ SOLN: 12.5000 mg | INTRAMUSCULAR | Status: DC | PRN

## 2019-09-11 MED ORDER — FENTANYL-BUPIVACAINE-NACL 0.5-0.125-0.9 MG/250ML-% EP SOLN
EPIDURAL | Status: AC
  Filled 2019-09-11: qty 250

## 2019-09-11 MED ORDER — BETAMETHASONE SOD PHOS & ACET 6 (3-3) MG/ML IJ SUSP
12.0000 mg | INTRAMUSCULAR | Status: DC
  Administered 2019-09-11: 12 mg via INTRAMUSCULAR
  Filled 2019-09-11: qty 5
  Filled 2019-09-11: qty 2

## 2019-09-11 MED ORDER — OXYTOCIN BOLUS FROM INFUSION
500.0000 mL | Freq: Once | INTRAVENOUS | Status: AC
  Administered 2019-09-11: 500 mL via INTRAVENOUS

## 2019-09-11 MED ORDER — DIPHENHYDRAMINE HCL 25 MG PO CAPS: 25.0000 mg | ORAL_CAPSULE | Freq: Four times a day (QID) | ORAL | Status: DC | PRN

## 2019-09-11 MED ORDER — LACTATED RINGERS IV SOLN: 500.0000 mL | Freq: Once | INTRAVENOUS | Status: DC

## 2019-09-11 MED ORDER — FENTANYL CITRATE (PF) 100 MCG/2ML IJ SOLN
50.0000 ug | INTRAMUSCULAR | Status: DC | PRN
  Administered 2019-09-11: 100 ug via INTRAVENOUS
  Administered 2019-09-11: 50 ug via INTRAVENOUS
  Filled 2019-09-11 (×2): qty 2

## 2019-09-11 NOTE — Progress Notes (Signed)
Patient ID: Deanna [name], female   DOB: 1995/11/27, 23 y.o.   MRN: [id number] Wants epidural  Vitals:   09/11/19 0226 09/11/19 0415 09/11/19 0520 09/11/19 0640  BP:  (!) 141/80 113/62 (!) 108/46  Pulse:  93 89 74  Resp:  20 20 20   Temp: 97.9 F (36.6 C) 98.7 F (37.1 C)  97.8 F (36.6 C)  TempSrc:  Oral  Oral  SpO2: 100%      FHR reassuring UCs every 2-3 min  Cervix reported to be 6cm but not in system yet  Will get epidural and wait for second stage

## 2019-09-11 NOTE — MAU Note (Signed)
Pt reports to MAU for ROM that occurred at 0200. Is reporting some pressure, denies VB, + some movement.

## 2019-09-11 NOTE — Anesthesia Preprocedure Evaluation (Signed)
Anesthesia Evaluation  Patient identified by MRN, date of birth, ID band Patient awake    Reviewed: Allergy & Precautions, Patient's Chart, lab work & pertinent test results  Airway Mallampati: II  TM Distance: >3 FB     Dental   Pulmonary former smoker,    breath sounds clear to auscultation       Cardiovascular negative cardio ROS   Rhythm:Regular Rate:Normal     Neuro/Psych negative neurological ROS     GI/Hepatic Neg liver ROS, crohns   Endo/Other  negative endocrine ROS  Renal/GU negative Renal ROS     Musculoskeletal  (+) Arthritis ,   Abdominal   Peds  Hematology negative hematology ROS (+)   Anesthesia Other Findings   Reproductive/Obstetrics                             Lab Results  Component Value Date   WBC 10.9 (H) 09/11/2019   HGB 12.4 09/11/2019   HCT 36.9 09/11/2019   MCV 90.0 09/11/2019   PLT 199 09/11/2019   Lab Results  Component Value Date   CREATININE 0.63 04/12/2019   BUN 7 04/12/2019   NA 136 04/12/2019   K 4.5 04/12/2019   CL 100 04/12/2019   CO2 20 04/12/2019    Anesthesia Physical Anesthesia Plan  ASA: II  Anesthesia Plan: Epidural   Post-op Pain Management:    Induction:   PONV Risk Score and Plan: Treatment may vary due to age or medical condition  Airway Management Planned: Natural Airway  Additional Equipment:   Intra-op Plan:   Post-operative Plan:   Informed Consent: I have reviewed the patients History and Physical, chart, labs and discussed the procedure including the risks, benefits and alternatives for the proposed anesthesia with the patient or authorized representative who has indicated his/her understanding and acceptance.       Plan Discussed with:   Anesthesia Plan Comments:         Anesthesia Quick Evaluation

## 2019-09-11 NOTE — H&P (Signed)
LABOR AND DELIVERY ADMISSION HISTORY AND PHYSICAL NOTE  Deanna [name] is a 23 y.o. female G44P1011 with IUP at 47w5dby 7 wk UKoreapresenting for PPROM.  Patient had SROM around 0200, noted fluid color was yellow   She reports positive fetal movement. She denies vaginal bleeding.   She plans on breast and bottle feeding. She requests Nexplanon for birth control.  [place]ren[place]/Complications[place]redacted[place] , IUGR 5%, cephalic presentation, posterior placenta, EFW 1859 grams Pregnancy complications:  - IUGR - Hx of Crohn's and JIA - HGSIL pap  Past Medical History: Past Medical History:  Diagnosis Date  . Anxiety   . Arthritis, 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[place]   . Cro[place] disease ([place]    dx in 2012  . Depression   . Hemorrhoid   . Infection    UTI x many    Past Surgical History: Past Surgical History:  Procedure Laterality Date  . COLONOSCOPY      Obstetrical History: OB History    Gravida  3   Para  1   Term  1   Preterm  0   AB  1   Living  1     SAB  0   TAB  1   Ectopic  0   Multiple  0   Live Births  1           Social History: Social History   Socioeconomic History  . Marital status: Single    Spouse name: Not on file  . Number of children: 1  . Years of education: Not on file  . Highest education level: Not on file  Occupational History  . Occupation: sShip broker Tobacco Use  . Smoking status: Former Smoker    Types: E-cigarettes, Cigarettes  . Smokeless tobacco: Never Used  . Tobacco comment: vaping in high school  Substance and Sexual Activity  . Alcohol use: No  . Drug use: No  . Sexual activity: Yes    Birth control/protection: None  Other Topics Concern  . Not on file  Social History Narrative  . Not on file   Social Determinants of Health   Financial Resource Strain:   . Difficulty of Paying Living Expenses: Not on file  Food Insecurity: No Food Insecurity  . Worried About RCharity fundraiserin  the Last Year: Never true  . Ran Out of Food in the Last Year: Never true  Transportation Needs: No Transportation Needs  . Lack of Transportation (Medical): No  . Lack of Transportation (Non-Medical): No  Physical Activity:   . Days of Exercise per Week: Not on file  . Minutes of Exercise per Session: Not on file  Stress:   . Feeling of Stress : Not on file  Social Connections:   . Frequency of Communication with Friends and Family: Not on file  . Frequency of Social Gatherings with Friends and Family: Not on file  . Attends Religious Services: Not on file  . Active Member of Clubs or Organizations: Not on file  . Attends CArchivistMeetings: Not on file  . Marital Status: Not on file    Family History: Family History  Problem Relation Age of Onset  . Diabetes Maternal Grandmother   . Gallstones Mother   . Anxiety disorder Mother   . Depression Mother   . Stomach cancer Maternal Aunt   . Diabetes Maternal Grandfather   . Heart disease Maternal Grandfather   . Stroke Maternal  Grandfather   . Colon cancer Maternal Grandfather     Allergies: No Known Allergies  Medications Prior to Admission  Medication Sig Dispense Refill Last Dose  . acetaminophen (TYLENOL) 500 MG tablet Take 500 mg by mouth every 6 (six) hours as needed.   Past Month at Unknown time  . ondansetron (ZOFRAN ODT) 4 MG disintegrating tablet Take 1 tablet (4 mg total) by mouth every 8 (eight) hours as needed for nausea or vomiting. 20 tablet 1 Past Month at Unknown time  . Prenatal Vit-Fe Fumarate-FA (PRENATAL PO) Take by mouth.   09/11/2019 at Unknown time  . sertraline (ZOLOFT) 50 MG tablet Take 1 tablet (50 mg total) by mouth daily. 30 tablet 3 09/11/2019 at Unknown time     Review of Systems  All systems reviewed and negative except as stated in HPI  Physical Exam Temperature 97.9 F (36.6 C), last menstrual period 12/05/2018, SpO2 100 %, unknown if currently breastfeeding. General  appearance: alert, oriented, NAD Lungs: normal respiratory effort Heart: regular rate Abdomen: soft, non-tender; gravid, leopolds 2200 g Extremities: No calf swelling or tenderness Fetal monitoringBaseline: 140 bpm, Variability: Good {> 6 bpm), Accelerations: Non-reactive but appropriate for gestational age and Decelerations: Absent Uterine activityFrequency: Every 4-5 minutes    Prenatal labs: ABO, Rh: O/Positive/-- (07/22 1101) Antibody: Negative (07/22 1101) Rubella: 1.60 (07/22 1101) RPR: Non Reactive (10/21 0901)  HBsAg: Negative (07/22 1101)  HIV: Non Reactive (10/21 0901)  GC/Chlamydia: neg/neg 09/06/2019  GBS: Negative/-- (12/16 1626)  2-hr GTT: normal 07/12/2019 Genetic screening:  Low risk panorama Anatomy US: IUGR  Prenatal Transfer Tool  Maternal Diabetes: No Genetic Screening: Normal Maternal Ultrasounds/Referrals: IUGR Fetal Ultrasounds or other Referrals:  None Maternal Substance Abuse:  No Significant Maternal Medications:  None Significant Maternal Lab Results: Group B Strep negative  Results for orders placed or performed during the hospital encounter of 09/11/19 (from the past 24 hour(s))  POCT fern test   Collection Time: 09/11/19  2:47 AM  Result Value Ref Range   POCT Fern Test Positive = ruptured amniotic membanes     Patient Active Problem List   Diagnosis Date Noted  . Preterm premature rupture of membranes (PPROM) with unknown onset of labor 09/11/2019  . Obesity in pregnancy 09/06/2019  . H/O rapid labor 09/06/2019  . IUGR (intrauterine growth restriction) affecting care of mother 06/25/2019  . HGSIL (high grade squamous intraepithelial lesion) on Pap smear of cervix 04/12/2019  . Supervision of high risk pregnancy, antepartum 02/06/2019  . Depression with anxiety 10/27/2018  . Exposure to STD 07/07/2018  . Keloid 09/12/2013  . Acne vulgaris 09/12/2013  . 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[place]) 09/30/2012  .  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 G3P1011 at 31w5dhere for PPROM.  #PPROM: betamethasone series #Labor: Expectant management #Pain: IV pain meds PRN, epidural upon request #FWB: Cat I #GBS/ID:  negative #COVID: swab pending #MOF: Breast and Bottle #MOC: Nexplanon #Circ: yes (inpatient)   MAnnice NeedyEVa [facility]12/21/2020, 2:50 AM

## 2019-09-11 NOTE — Anesthesia Procedure Notes (Signed)
Epidural Patient location during procedure: OB Start time: 09/11/2019 10:25 AM End time: 09/11/2019 10:33 AM  Staffing Anesthesiologist: Suzette Battiest, MD Performed: anesthesiologist   Preanesthetic Checklist Completed: patient identified, IV checked, site marked, risks and benefits discussed, surgical consent, monitors and equipment checked, pre-op evaluation and timeout performed  Epidural Patient position: sitting Prep: DuraPrep and site prepped and draped Patient monitoring: continuous pulse ox and blood pressure Approach: midline Location: L3-L4 Injection technique: LOR air  Needle:  Needle type: Tuohy  Needle gauge: 17 G Needle length: 9 cm and 9 Needle insertion depth: 7 cm Catheter type: closed end flexible Catheter size: 19 Gauge Catheter at skin depth: 12 cm Test dose: negative  Assessment Events: blood not aspirated, injection not painful, no injection resistance, no paresthesia and negative IV test

## 2019-09-11 NOTE — Discharge Summary (Signed)
Postpartum Discharge Summary      Patient Name: Deanna [name] DOB: 14-Jul-1996 MRN: [id number]  Date of admission: 09/11/2019 Delivering Provider: Seabron Spates   Date of discharge: 09/13/2019  Admitting diagnosis: Preterm premature rupture of membranes (PPROM) with unknown onset of labor [O42.919] Intrauterine pregnancy: [redacted]w[redacted]d    Secondary diagnosis:  Active Problems:   Preterm premature rupture of membranes (PPROM) with unknown onset of labor   Status post delivery at term  Additional problems: none     Discharge diagnosis: Preterm Pregnancy Delivered and IUGR                                                                                                Post partum procedures:Nexplanon placed  Augmentation: none, but did get Betamethasone  Complications: None  Hospital course:  Onset of Labor With Vaginal Delivery     23y.o. yo G3P1011 at 360w5das admitted in Latent Labor on 09/11/2019. Patient had an uncomplicated labor course as follows:  Membrane Rupture Time/Date: 2:00 AM ,09/11/2019   Intrapartum Procedures: Episiotomy: None [1]                                         Lacerations:  None [1]  Patient had a delivery of a Viable infant. 09/11/2019  Information for the patient's newborn:  WoPriscillia, Fouch0[[id number]]Delivery Method: Vag-Spont     Pateint had an uncomplicated postpartum course.  She is ambulating, tolerating a regular diet, passing flatus, and urinating well. Patient is discharged home in stable condition on 09/13/19.  Delivery time: 2:02 PM    Magnesium Sulfate received: No BMZ received: Yes Rhophylac:N/A MMR:N/A Transfusion:No  Physical exam  Vitals:   09/12/19 0500 09/12/19 1416 09/12/19 2145 09/13/19 0500  BP: (!) 117/44 (!) 112/54 124/70 127/72  Pulse: 67 63 70 72  Resp: 18 18 18 18   Temp: 97.7 F (36.5 C) 98.3 F (36.8 C) 97.9 F (36.6 C) 98.1 F (36.7 C)  TempSrc: Oral Oral Oral Oral  SpO2: 100% 100%  100%    General: alert, cooperative and no distress Lochia: appropriate Uterine Fundus: firm Incision: N/A DVT Evaluation: No evidence of DVT seen on physical exam. Negative Homan's sign. No cords or calf tenderness. Labs: Lab Results  Component Value Date   WBC 10.9 (H) 09/11/2019   HGB 12.4 09/11/2019   HCT 36.9 09/11/2019   MCV 90.0 09/11/2019   PLT 199 09/11/2019   CMP Latest Ref Rng & Units 04/12/2019  Glucose 65 - 99 mg/dL 86  BUN 6 - 20 mg/dL 7  Creatinine 0.57 - 1.00 mg/dL 0.63  Sodium 134 - 144 mmol/L 136  Potassium 3.5 - 5.2 mmol/L 4.5  Chloride 96 - 106 mmol/L 100  CO2 20 - 29 mmol/L 20  Calcium 8.7 - 10.2 mg/dL 9.4  Total Protein 6.0 - 8.5 g/dL 7.4  Total Bilirubin 0.0 - 1.2 mg/dL <0.2  Alkaline Phos 39 - 117 IU/L 57  AST 0 - 40  IU/L 7  ALT 0 - 32 IU/L 5    Discharge instruction: per After Visit Summary and "Baby and Me Booklet".  After visit meds:  Allergies as of 09/13/2019   No Known Allergies     Medication List    STOP taking these medications   acetaminophen 500 MG tablet Commonly known as: TYLENOL   ondansetron 4 MG disintegrating tablet Commonly known as: Zofran ODT   sertraline 50 MG tablet Commonly known as: Zoloft     TAKE these medications   ibuprofen 600 MG tablet Commonly known as: ADVIL Take 1 tablet (600 mg total) by mouth every 6 (six) hours.   PRENATAL PO Take by mouth.       Diet: routine diet  Activity: Advance as tolerated. Pelvic rest for 6 weeks.   Outpatient follow up: Follow up Appt: Future Appointments  Date Time Provider [place]  10/04/2019  2:45 PM Bo Merino, MD [facility] None  10/11/2019  3:35 PM Luvenia Redden, PA-C [facility] [place]  10/24/2019  2:30 PM Bo Merino, MD [facility] None   Follow up Visit: [place] for [facility] Follow up on 10/11/2019.   Specialty: Obstetrics and Gynecology Why: for postpartum checkup Contact information: [address] [place], [place] [id number] [place] [address] [phone]           Please schedule this patient for Postpartum visit in: 4 weeks with the following provider: Any provider For C/S patients schedule nurse incision check in weeks 2 weeks: no High risk pregnancy complicated by: IUGR, Crohn's Delivery mode:  SVD Anticipated Birth Control:  Nexplanon PP Procedures needed: none  Schedule Integrated [place] visit: no  Newborn Data: Live born female  Birth Weight: 4 lb 12.9 oz (2180 g) APGAR: 7, 9  Newborn Delivery   Birth date/time: 09/11/2019 14:02:00 Delivery type: Vaginal, Spontaneous      Baby Feeding: Bottle and Breast Disposition:home with mother   09/13/2019 Christin Fudge, CNM

## 2019-09-11 NOTE — Lactation Note (Signed)
This note was copied from a baby's chart. Lactation Consultation Note  Patient Name: Deanna [name] [id number] Date: 09/11/2019 Reason for consult: Initial assessment;Infant < 6lbs;Late-preterm 34-36.6wks P2, 9 hour LPTI, less than 5 lbs at birth. Mom is active on the [facility] program in [facility] and has a DEBP at home. Mom hx: crohn's disease, depression and on Zoloft-L2-compatible with breastfeeding. Mom's feeding choice is breast and formula feeding. Per mom, breastfeeding is going well and he latches good.  Per mom, she recently finished breastfeeding infant for 20 minutes and supplemented infant with Similac Neosure  22 kcal formula 5 mls prior to [facility] entering the room.  LC did not observe at latch at this time. Mom is experienced at breastfeeding, she breastfeed her 23 year old for 18 months. Mom taught back hand expression and easily expressed 6 mls of EBM in a bullet, mom plans to offer to infant after she breastfeeds him at the next feeding. Parents have been doing STS with infant. LC discussed LPTI feeding policy and procedures, Mom knows to breastfeed according hunger cues, 8 to 12 times within 24 hours, not exceed 3 hours without breastfeeding infant and not to breastfeed longer than 30 minutes green sheet ) given.  Mom understands supplemental amounts after latching infant at breast based on infant's age and hours of life. Mom knows to call RN or LC if she has any questions, concerns or need assistance with latching infant at breast. Mom shown how to use DEBP & how to disassemble, clean, & reassemble parts. Mom will use DEBP every 3 hours for 15 minutes on initial setting.  Mom made aware of O/P services, breastfeeding support groups, community resources, and our phone # for post-discharge questions.  Mom's plan: 1. Breastfeed infant according LPTI policy on cues, 8 to 12 times within 24 hours and not exceed 3 hours without breastfeeding infant. 2. Mom will supplement infant  afterwards with EBM/ and or formula after latching infant to breast. 3. Mom will pump every 3 hours for 15 minutes on initial setting using DEBP and give infant back EBM. 4. Parents will continue to do STS as much as possible with infant.   Maternal Data Formula Feeding for Exclusion: Yes Reason for exclusion: Mother's choice to formula and breast feed on admission Has patient been taught Hand Expression?: Yes Does the patient have breastfeeding experience prior to this delivery?: Yes  Feeding Feeding Type: Breast Fed  LATCH Score                   Interventions Interventions: Breast feeding basics reviewed;Skin to skin;DEBP;Expressed milk;Hand express  Lactation Tools Discussed/Used WIC Program: Yes Pump Review: Setup, frequency, and cleaning;Milk Storage Initiated by:: Vicente Serene, IBCLC Date initiated:: 09/11/19   Consult Status Consult Status: Follow-up Date: 09/12/19 Follow-up type: In-patient    Vicente Serene 09/11/2019, 11:12 PM

## 2019-09-11 NOTE — Progress Notes (Signed)
Labor Progress Note Deanna [name] is a 23 y.o. G3P1011 at 18w5dpresented for SROM  S:  Patient more uncomfortable with contractions  O:  BP 113/62   Pulse 89   Temp 98.7 F (37.1 C) (Oral)   Resp 20   LMP 12/05/2018 (Exact Date)   SpO2 100%   Fetal Tracing:  Baseline: 130 Variability: moderate Accels: 15x15 Decels: none  Toco: unable to trace   CVE: Dilation: 3 Effacement (%): 50 Station: -3 Presentation: Vertex Exam by:: JJavier DockerRN   A&P: 23y.o. G3P1011 332w5dROM #Labor: Unable to adequately trace contractions. IUPC placed without difficulty.  #Pain: planning epidural #FWB: Cat 1 #GBS negative  CaWende MottCNM 6:37 AM

## 2019-09-12 ENCOUNTER — Encounter: Payer: BC Managed Care – PPO | Admitting: Obstetrics & Gynecology

## 2019-09-12 DIAGNOSIS — Z30017 Encounter for initial prescription of implantable subdermal contraceptive: Secondary | ICD-10-CM

## 2019-09-12 MED ORDER — LIDOCAINE HCL 1 % IJ SOLN
0.0000 mL | Freq: Once | INTRAMUSCULAR | Status: AC | PRN
  Administered 2019-09-12: 20 mL via INTRADERMAL
  Filled 2019-09-12: qty 20

## 2019-09-12 MED ORDER — ETONOGESTREL 68 MG ~~LOC~~ IMPL
68.0000 mg | DRUG_IMPLANT | Freq: Once | SUBCUTANEOUS | Status: AC
  Administered 2019-09-12: 68 mg via SUBCUTANEOUS
  Filled 2019-09-12: qty 1

## 2019-09-12 NOTE — Progress Notes (Addendum)
Post Partum Day 1  Subjective:  Deanna [name] is a 23 y.o. [id number] 29w5ds/p SVD.  No acute events overnight.  Pt denies problems with ambulating, voiding or po intake.  She denies nausea or vomiting.  Pain is well controlled.  She has had flatus. She has not had bowel movement.  Lochia Minimal.  Plan for birth control is  Nexplanon .  Method of Feeding: breast and bottle  Objective: BP (!) 117/44 (BP Location: Right Arm)   Pulse 67   Temp 97.7 F (36.5 C) (Oral)   Resp 18   LMP 12/05/2018 (Exact Date)   SpO2 100%   Breastfeeding Unknown   Physical Exam:  General: alert, cooperative and no distress Lochia:normal flow Chest: normal work of breathing Heart: regular rate Abdomen: soft, nontender Uterine Fundus: firm DVT Evaluation: No evidence of DVT seen on physical exam.   Recent Labs    09/11/19 0330  HGB 12.4  HCT 36.9    Assessment/Plan:  ASSESSMENT: Deanna VANCAMPis a 23y.o. [id number] #1 s/p NSVD doing well.   Plan for discharge tomorrow, Breastfeeding, Lactation consult, Circumcision prior to discharge and Contraception Nexplanon Continue routine PP care   LOS: 1 day   Deanna RevelDO 09/12/2019, 8:01 AM   GME ATTESTATION:  I saw and evaluated the patient. I agree with the findings and the plan of care as documented in the resident's note.  SpMerilyn BabaDO OB Fellow, Faculty Practice 09/12/2019 12:00 PM

## 2019-09-12 NOTE — Progress Notes (Signed)
CSW received consult for history of anxiety and depression.  CSW met with MOB to offer support and complete assessment.    MOB sitting up in bed with FOB present at bedside and holding infant, when CSW entered the room. CSW introduced self and received verbal permission to complete assessment with FOB present. MOB and FOB both very pleasant and engaged throughout assessment. Both appeared to be well-bonded to infant and FOB was tending to infant cues throughout visit. 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 MOB shared symptoms of separation anxiety, sadness, not wanting to leave the home, being irritable with family and not trusting anyone with infant. MOB shared she is currently taking Zoloft and reported a positive experience with it and feels it has helped manage her symptoms. MOB expressed some hesitation in taking it while pregnant but reported she is glad she did. MOB denied any current mental health symptoms and reported she feels "good". CSW provided education regarding the baby blues period vs. perinatal mood disorders. CSW recommended self-evaluation during the postpartum time period using the New Mom Checklist from Postpartum Progress and encouraged MOB to contact a medical professional if symptoms are noted at any time. MOB did not appear to be displaying any acute mental health symptoms and denied any current SI or HI. MOB reported feeling well-supported by FOB, her mother, her grandmother, her aunt and her son. FOB noted to be a good support for MOB during visit.  MOB confirmed having all essential items for infant once discharged and reported infant would be sleeping in a bassinet once home. CSW provided review of Sudden Infant Death Syndrome (SIDS) precautions and safe sleeping habits.    CSW identifies no further need for intervention and no barriers to discharge at this time.  Daejon Lich,  LCSW [facility] [phone] 

## 2019-09-12 NOTE — Anesthesia Postprocedure Evaluation (Signed)
Anesthesia Post Note  Patient: Deanna [name]  Procedure(s) Performed: AN AD HOC LABOR EPIDURAL     Patient location during evaluation: Mother Baby Anesthesia Type: Epidural Level of consciousness: awake and alert Pain management: pain level controlled Vital Signs Assessment: post-procedure vital signs reviewed and stable Respiratory status: spontaneous breathing, nonlabored ventilation and respiratory function stable Cardiovascular status: stable Postop Assessment: no headache, no backache and epidural receding Anesthetic complications: no    Last Vitals:  Vitals:   09/12/19 0109 09/12/19 0500  BP: (!) 125/56 (!) 117/44  Pulse: 63 67  Resp: 18 18  Temp: 36.7 C 36.5 C  SpO2: 100% 100%    Last Pain:  Vitals:   09/12/19 0808  TempSrc:   PainSc: 0-No pain   Pain Goal:                Epidural/Spinal Function Cutaneous sensation: Normal sensation (09/12/19 0808), Patient able to flex knees: Yes (09/12/19 5056), Patient able to lift hips off bed: Yes (09/12/19 0808), Back pain beyond tenderness at insertion site: No (09/12/19 0808), Progressively worsening motor and/or sensory loss: No (09/12/19 0808), Bowel and/or bladder incontinence post epidural: No (09/12/19 9794)  [place] [employer]

## 2019-09-12 NOTE — Discharge Instructions (Signed)
Postpartum Care After Vaginal Delivery This sheet gives you information about how to care for yourself from the time you deliver your baby to up to 6-12 weeks after delivery (postpartum period). Your health care provider may also give you more specific instructions. If you have problems or questions, contact your health care provider. Follow these instructions at home: Vaginal bleeding  It is normal to have vaginal bleeding (lochia) after delivery. Wear a sanitary pad for vaginal bleeding and discharge. ? During the first week after delivery, the amount and appearance of lochia is often similar to a menstrual period. ? Over the next few weeks, it will gradually decrease to a dry, yellow-brown discharge. ? For most women, lochia stops completely by 4-6 weeks after delivery. Vaginal bleeding can vary from woman to woman.  Change your sanitary pads frequently. Watch for any changes in your flow, such as: ? A sudden increase in volume. ? A change in color. ? Large blood clots.  If you pass a blood clot from your vagina, save it and call your health care provider to discuss. Do not flush blood clots down the toilet before talking with your health care provider.  Do not use tampons or douches until your health care provider says this is safe.  If you are not breastfeeding, your period should return 6-8 weeks after delivery. If you are feeding your child breast milk only (exclusive breastfeeding), your period may not return until you stop breastfeeding. Perineal care  Keep the area between the vagina and the anus (perineum) clean and dry as told by your health care provider. Use medicated pads and pain-relieving sprays and creams as directed.  If you had a cut in the perineum (episiotomy) or a tear in the vagina, check the area for signs of infection until you are healed. Check for: ? More redness, swelling, or pain. ? Fluid or blood coming from the cut or tear. ? Warmth. ? Pus or a bad  smell.  You may be given a squirt bottle to use instead of wiping to clean the perineum area after you go to the bathroom. As you start healing, you may use the squirt bottle before wiping yourself. Make sure to wipe gently.  To relieve pain caused by an episiotomy, a tear in the vagina, or swollen veins in the anus (hemorrhoids), try taking a warm sitz bath 2-3 times a day. A sitz bath is a warm water bath that is taken while you are sitting down. The water should only come up to your hips and should cover your buttocks. Breast care  Within the first few days after delivery, your breasts may feel heavy, full, and uncomfortable (breast engorgement). Milk may also leak from your breasts. Your health care provider can suggest ways to help relieve the discomfort. Breast engorgement should go away within a few days.  If you are breastfeeding: ? Wear a bra that supports your breasts and fits you well. ? Keep your nipples clean and dry. Apply creams and ointments as told by your health care provider. ? You may need to use breast pads to absorb milk that leaks from your breasts. ? You may have uterine contractions every time you breastfeed for up to several weeks after delivery. Uterine contractions help your uterus return to its normal size. ? If you have any problems with breastfeeding, work with your health care provider or Science writer.  If you are not breastfeeding: ? Avoid touching your breasts a lot. Doing this can make  your breasts produce more milk. ? Wear a good-fitting bra and use cold packs to help with swelling. ? Do not squeeze out (express) milk. This causes you to make more milk. Intimacy and sexuality  Ask your health care provider when you can engage in sexual activity. This may depend on: ? Your risk of infection. ? How fast you are healing. ? Your comfort and desire to engage in sexual activity.  You are able to get pregnant after delivery, even if you have not had  your period. If desired, talk with your health care provider about methods of birth control (contraception). Medicines  Take over-the-counter and prescription medicines only as told by your health care provider.  If you were prescribed an antibiotic medicine, take it as told by your health care provider. Do not stop taking the antibiotic even if you start to feel better. Activity  Gradually return to your normal activities as told by your health care provider. Ask your health care provider what activities are safe for you.  Rest as much as possible. Try to rest or take a nap while your baby is sleeping. Eating and drinking   Drink enough fluid to keep your urine pale yellow.  Eat high-fiber foods every day. These may help prevent or relieve constipation. High-fiber foods include: ? Whole grain cereals and breads. ? Brown rice. ? Beans. ? Fresh fruits and vegetables.  Do not try to lose weight quickly by cutting back on calories.  Take your prenatal vitamins until your postpartum checkup or until your health care provider tells you it is okay to stop. Lifestyle  Do not use any products that contain nicotine or tobacco, such as cigarettes and e-cigarettes. If you need help quitting, ask your health care provider.  Do not drink alcohol, especially if you are breastfeeding. General instructions  Keep all follow-up visits for you and your baby as told by your health care provider. Most women visit their health care provider for a postpartum checkup within the first 3-6 weeks after delivery. Contact a health care provider if:  You feel unable to cope with the changes that your child brings to your life, and these feelings do not go away.  You feel unusually sad or worried.  Your breasts become red, painful, or hard.  You have a fever.  You have trouble holding urine or keeping urine from leaking.  You have little or no interest in activities you used to enjoy.  You have not  breastfed at all and you have not had a menstrual period for 12 weeks after delivery.  You have stopped breastfeeding and you have not had a menstrual period for 12 weeks after you stopped breastfeeding.  You have questions about caring for yourself or your baby.  You pass a blood clot from your vagina. Get help right away if:  You have chest pain.  You have difficulty breathing.  You have sudden, severe leg pain.  You have severe pain or cramping in your lower abdomen.  You bleed from your vagina so much that you fill more than one sanitary pad in one hour. Bleeding should not be heavier than your heaviest period.  You develop a severe headache.  You faint.  You have blurred vision or spots in your vision.  You have bad-smelling vaginal discharge.  You have thoughts about hurting yourself or your baby. If you ever feel like you may hurt yourself or others, or have thoughts about taking your own life, get help  right away. You can go to the nearest emergency department or call:  Your local emergency services (911 in the [place]  A suicide crisis helpline, such as the [address] at 313-663-2434. This is open 24 hours a day. Summary  The period of time right after you deliver your newborn up to 6-12 weeks after delivery is called the postpartum period.  Gradually return to your normal activities as told by your health care provider.  Keep all follow-up visits for you and your baby as told by your health care provider. This information is not intended to replace advice given to you by your health care provider. Make sure you discuss any questions you have with your health care provider. Document Released: 07/05/2007 Document Revised: 09/10/2017 Document Reviewed: 06/21/2017 Elsevier Patient Education  [place] Instructions After Insertion   Keep bandage clean and dry for 24 hours   Keep the steri-strips on your arm until  they fall off. This usually takes 1-2 wks. If they fall off before 1 week, replace them with the ones we provided you.   May use ice/Tylenol/Ibuprofen for soreness or pain   If you develop fever, drainage or increased warmth from incision site-contact office immediately

## 2019-09-12 NOTE — Lactation Note (Signed)
This note was copied from a baby's chart. Lactation Consultation Note  Patient Name: Deanna [name] BTDHR'C Date: 09/12/2019 Reason for consult: Follow-up assessment;Late-preterm 34-36.6wks;Infant < 6lbs;Infant weight loss  Baby is 40 hours old and per mom -the  last baby fed was at 9 am for 20 mins and has been sleeping since.  LC recommended checking the diaper and using techniques to wake the baby up. LC checked the diaper and it was dry. Baby waking up slowly. LC recommended since it had been awhile since feeding, try and appetizer of the formula / PACE feeding and baby took 5 ml for dad. [place] instructed dad on PACE feeding/ he did well. Baby more awake and LC assisted mom to latch on the left breast / cross cradle / depth achieved and LC used the curved tip and inserted the tip in the corner of the mouth and baby took 3 ml , lost alittle of the 39m dripping out.  Baby released, nipple well rounded and dad finished the feeding PACE feeding and he took 10 ml .  LC reviewed supply and demand / discussing the benefits of hand expressing ( which she does well) and LC encouraged mom to post pump with the DEBP  To increase volume.  Mom is an experienced breast feeding mother, but of a term infant.  LC reviewed potential feeding behaviors of a LPT infant and the importance of feeding cues and by 3 hours due to baby being less than 5 pounds.  Mom did need assistance with latching and review.  LC updated the doc flow sheets from this am per mom.  LC inform the [facility] and how [facility] fed.     Maternal Data Has patient been taught Hand Expression?: Yes(mom does well with hand expressing)  Feeding Feeding Type: Formula  LATCH Score Latch: Repeated attempts needed to sustain latch, nipple held in mouth throughout feeding, stimulation needed to elicit sucking reflex.  Audible Swallowing: Spontaneous and intermittent  Type of Nipple: Everted at rest and after  stimulation  Comfort (Breast/Nipple): Soft / non-tender  Hold (Positioning): Assistance needed to correctly position infant at breast and maintain latch.  LATCH Score: 8  Interventions Interventions: Breast feeding basics reviewed  Lactation Tools Discussed/Used WIC Program: (per mom has been in contact with [facility] still needs to get registered)   Consult Status Consult Status: Follow-up Date: 09/13/19 Follow-up type: In-patient    MW[place]burn12/22/2020, 2:50 PM

## 2019-09-12 NOTE — Progress Notes (Signed)
Patient ID: Deanna [name], female   DOB: [birth year], 23 y.o.   MRN: [id number] Ms. Caicedo is requesting Nexplanon insertion s/p . No complaints. UPT ordered today.  BP (!) 112/54 (BP Location: Right Arm)   Pulse 63   Temp 98.3 F (36.8 C) (Oral)   Resp 18   LMP 12/05/2018 (Exact Date)   SpO2 100%   Breastfeeding Unknown       Nexplanon Insertion Procedure Patient identified, informed consent performed, consent signed.   Patient does understand that irregular bleeding is a very common side effect of this medication. She was advised to have backup contraception for one week after placement. Pregnancy test in clinic today was negative.  Appropriate time out taken.  Patient's left arm was prepped and draped in the usual sterile fashion. The ruler used to measure and mark insertion area.  Patient was prepped with alcohol swab and then injected with 2 ml of 1% lidocaine.  She was prepped with betadine, Nexplanon removed from packaging,  Device confirmed in needle (by patient, RN & CNM), then inserted full length of needle and withdrawn per handbook instructions. Nexplanon was able to palpated in the patient's arm; patient palpated the insert herself. There was minimal blood loss.  Patient insertion site covered with steri-strips and a pressure bandage to reduce any bruising.  The patient tolerated the procedure well and was given post procedure instructions. Follow-up via My Chart video visit , unless having problems and need to be seen in the office.  Nexplanon Lot#: [id number] / Expiration Date: 10/18/2021  Laury Deep, CNM  09/12/2019 5:20 PM

## 2019-09-13 LAB — SURGICAL PATHOLOGY

## 2019-09-13 MED ORDER — IBUPROFEN 600 MG PO TABS: 600.0000 mg | ORAL_TABLET | Freq: Four times a day (QID) | ORAL | 0 refills | Status: DC

## 2019-09-13 NOTE — Progress Notes (Signed)
AVS printed and discharge instructions given to patient. Patient informed of follow up appointment and to pick up prescriptions. Patient has no further questions.

## 2019-09-13 NOTE — Lactation Note (Signed)
This note was copied from a baby's chart. Lactation Consultation Note  Patient Name: Deanna [name] PYKDX'I Date: 09/13/2019 Reason for consult: Follow-up assessment;Late-preterm 34-36.6wks;Infant < 6lbs Baby is 43 hours old/4% weight loss.  Mom reports that baby is latching well.  She is also giving some supplemental formula feeds.  Breasts are feeling heavier.  Discussed milk coming to volume and the prevention and treatment of engorgement.  Mom has a breast pump at home.  No questions or concerns.  Reviewed outpatient services and encouraged to call prn.  Maternal Data    Feeding    LATCH Score                   Interventions    Lactation Tools Discussed/Used     Consult Status Consult Status: Complete Follow-up type: Call as needed    Ave Filter 09/13/2019, 9:07 AM

## 2019-09-18 ENCOUNTER — Ambulatory Visit (HOSPITAL_COMMUNITY): Payer: BC Managed Care – PPO

## 2019-10-03 NOTE — Progress Notes (Signed)
Virtual Visit via Telephone Note  I connected with Deanna [name] on 10/04/19 at  2:45 PM EST by telephone and verified that I am speaking with the correct person using two identifiers.  Location: Patient: Home  Provider: Clinic  This service was conducted via virtual visit.   The patient was located at home. I was located in my office.  Consent was obtained prior to the virtual visit and is aware of possible charges through their insurance for this visit.  The patient is an established patient.  Dr. Estanislado Pandy, MD conducted the virtual visit and Hazel Sams, PA-C acted as scribe during the service.  Office staff helped with scheduling follow up visits after the service was conducted.     I discussed the limitations, risks, security and privacy concerns of performing an evaluation and management service by telephone and the availability of in person appointments. I also discussed with the patient that there may be a patient responsible charge related to this service. The patient expressed understanding and agreed to proceed.  CC: History of Present Illness: Patient is a 24 year old female with past medical history of JIA and Crohn's disease.  Patient states that when she was in middle school she started having problems with extending her wrist joints, elbow joints and was having some discomfort in her knee joints and feet.  She states she had swelling in her knee joints.  She was seen at the [facility] rheumatology clinic where she was given the diagnosis of JIA.  On the chart review it seems she was on sulfasalazine and Arava and methotrexate at different times.  She states that she was also given steroid injections to her knee joints.  In 2012 she presented with blood in her stool, diarrhea and weight loss.  At the time she was diagnosed with Crohn's disease.  In the notes there is a mention about use of Enbrel I am not sure if she really took Enbrel shots are not.  She was given Remicade infusions  starting 2012 until 2014.  She lost follow-up and according to the records [place] was added in 2015.  She was on methotrexate off and on.  She has had 2 pregnancies since then.  She states that she delivered her baby on September 11, 2019.  She is gravida 2 para 2 2 miscarriages 0.  Currently she is not taking any medications for arthritis or Crohn's disease.  She states she established with Dr. Tarri Glenn.  She was not placed on any treatment.  I also reviewed records from Dr. Tarri Glenn and according to the visit in March 2020 based on MRE she was given the diagnosis of active Crohn's disease.  There is also possible history of fistula in the past.  Patient denies any history of psoriasis although there is history of psoriasis in her father.  According to patient currently she has been having joint pain but no joint swelling.  She states she has had lower back pain since she had epidural.  She also gives history of discomfort in her hip joints and her knee joints.  She has been having some discomfort in her shoulders.  She states that she has a lot of popping and cracking in her joints.  She was also diagnosed with depression last year and she has been on sertraline 10 mg p.o. nightly which is helped.   Review of Systems  Constitutional: Positive for malaise/fatigue. Negative for fever.  Eyes: Negative for photophobia, pain, discharge and redness.  Respiratory:  Negative for cough, shortness of breath and wheezing.   Cardiovascular: Negative for chest pain and palpitations.  Gastrointestinal: Negative for blood in stool, constipation and diarrhea.  Genitourinary: Negative for dysuria.  Musculoskeletal: Positive for joint pain. Negative for back pain, myalgias and neck pain.  Skin: Negative for rash.  Neurological: Negative for dizziness and headaches.  Psychiatric/Behavioral: Positive for depression. The patient is nervous/anxious. The patient does not have insomnia.     Observations/Objective: Physical  Exam  Constitutional: She is oriented to person, place, and time.  Neurological: She is alert and oriented to person, place, and time.  Psychiatric: Mood, memory, affect and judgment normal.   Patient reports morning stiffness for 10  minutes.   Patient reports nocturnal pain.  Difficulty dressing/grooming: Denies Difficulty climbing stairs: Reports Difficulty getting out of chair: Denies Difficulty using hands for taps, buttons, cutlery, and/or writing: Denies  Last office visit at [facility] on 08/31/14.   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  She states she was having ongoing pain and swelling in her knee joints and pain in multiple joints.  The history is not very clear.  On detailed review from her [facility] chart she had been on methotrexate, Arava, sulfasalazine at different times and was also treated with IV Remicade while she was diagnosed with Crohn's disease.  Currently she complains of pain in multiple joints and denies joint swelling.  We will obtain lab work today and will bring her back in couple of weeks for a follow-up visit. 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 Tarri Glenn.  Based on MR E she had active Crohn's disease in March 2020.  I noticed that patient has lost follow-up.  I have  advised her to follow-up with Dr. Tarri Glenn.  I would also forward my notes to Dr. Tarri Glenn.  Patient was diagnosed with Crohn's disease in 2012.  She was on IV Remicade from 2012-2014.  High risk medication use- She will probably benefit from Humira.  In anticipation to start her on immunosuppressive agents I will obtain following labs today. -     CBC with diff -     CMP with GFR -     Hepatitis B core antibody, IgM -     Hepatitis B surface antigen -     Hepatitis C antibody -     HIV antibody -     QuantiFERON-TB Gold Plus -     Serum protein electrophoresis with reflex -     Immunoglobulins  Acne vulgaris  HGSIL (high grade squamous intraepithelial lesion) on Pap smear of cervix  Depression with anxiety-patient states she was diagnosed with depression last year and she is on sertraline 10 mg p.o. nightly, which is controlling her symptoms quite well.  Family history of psoriasis Comments: Father  Other fatigue -     CK (Creatine Kinase) -     TSH    Follow Up Instructions: She will follow up as scheduled on October 10, 2018.   I discussed the assessment and treatment plan with the patient. The patient was provided an opportunity to ask questions and all were answered. The patient agreed with the plan and demonstrated an understanding of the instructions.   The patient was advised to call back or seek an in-person evaluation if the symptoms worsen or if the condition fails to improve as anticipated.  I provided 60 minutes of non-face-to-face time during this encounter.   Bo Merino, MD

## 2019-10-04 ENCOUNTER — Other Ambulatory Visit: Payer: Self-pay

## 2019-10-04 ENCOUNTER — Telehealth (INDEPENDENT_AMBULATORY_CARE_PROVIDER_SITE_OTHER): Payer: BC Managed Care – PPO | Admitting: Rheumatology

## 2019-10-04 DIAGNOSIS — L700 Acne vulgaris: Secondary | ICD-10-CM | POA: Diagnosis not present

## 2019-10-04 DIAGNOSIS — K50813 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: Secondary | ICD-10-CM | POA: Diagnosis not present

## 2019-10-04 DIAGNOSIS — M0880 Other juvenile arthritis, unspecified site: Secondary | ICD-10-CM | POA: Diagnosis not present

## 2019-10-04 DIAGNOSIS — Z840 Family history of diseases of the skin and subcutaneous tissue: Secondary | ICD-10-CM

## 2019-10-04 DIAGNOSIS — R87613 High grade squamous intraepithelial lesion on cytologic smear of cervix (HGSIL): Secondary | ICD-10-CM

## 2019-10-04 DIAGNOSIS — Z79899 Other long term (current) drug therapy: Secondary | ICD-10-CM

## 2019-10-04 DIAGNOSIS — R5383 Other fatigue: Secondary | ICD-10-CM

## 2019-10-04 DIAGNOSIS — F418 Other specified anxiety disorders: Secondary | ICD-10-CM

## 2019-10-11 ENCOUNTER — Ambulatory Visit: Payer: BC Managed Care – PPO | Admitting: Medical

## 2019-10-15 ENCOUNTER — Encounter: Payer: Self-pay | Admitting: Family Medicine

## 2019-10-17 ENCOUNTER — Ambulatory Visit: Payer: Self-pay | Admitting: Family Medicine

## 2019-10-17 ENCOUNTER — Encounter: Payer: Self-pay | Admitting: Medical

## 2019-10-17 ENCOUNTER — Encounter (INDEPENDENT_AMBULATORY_CARE_PROVIDER_SITE_OTHER): Payer: BC Managed Care – PPO | Admitting: Medical

## 2019-10-17 ENCOUNTER — Telehealth: Payer: Self-pay | Admitting: Medical

## 2019-10-17 NOTE — Progress Notes (Signed)
10:39a- Called pt for My Chart visit, no answer, left VM that will call back in 10 to 15 minutes.    11:18a-2nd attempt, still no answer, will need to be rescheduled.

## 2019-10-17 NOTE — Patient Instructions (Signed)
Please reschedule as soon as possible

## 2019-10-17 NOTE — Telephone Encounter (Signed)
Attempted to call patient to get her rescheduled for her missed appointment. No answer, left voicemail for patient to give the office a call back to be rescheduled. No show letter mailed.

## 2019-10-18 ENCOUNTER — Ambulatory Visit (INDEPENDENT_AMBULATORY_CARE_PROVIDER_SITE_OTHER): Payer: BC Managed Care – PPO | Admitting: Family Medicine

## 2019-10-18 ENCOUNTER — Other Ambulatory Visit: Payer: Self-pay

## 2019-10-18 ENCOUNTER — Encounter: Payer: Self-pay | Admitting: Family Medicine

## 2019-10-18 VITALS — BP 120/68 | HR 100 | Temp 98.7°F | Resp 16 | Ht 68.0 in | Wt 239.0 lb

## 2019-10-18 DIAGNOSIS — K50813 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: Secondary | ICD-10-CM | POA: Diagnosis not present

## 2019-10-18 DIAGNOSIS — F418 Other specified anxiety disorders: Secondary | ICD-10-CM | POA: Diagnosis not present

## 2019-10-18 MED ORDER — SERTRALINE HCL 100 MG PO TABS: 100.0000 mg | ORAL_TABLET | Freq: Every day | ORAL | 3 refills | Status: DC

## 2019-10-18 NOTE — Progress Notes (Signed)
Subjective:    Patient ID: Deanna [name], female    DOB: 1996/05/09, 24 y.o.   MRN: [id number]  Patient presents for Depression (issues with sleeping, postpartum depression)   Pt here for her mood.  She has history of depression with anxiety.  She now feels like this is worsening her postpartum state.  She has little boy Deanna [name] who is 45 weeks old   she is more fatigued, but feels low energy feels like she cannot turn her mind off at nighttime to relax.  Feels like she is not doing enough for some mother.  She is not in school she is not working so she feels like she is not contributing to the household has to help others too much.  Her boyfriend is working second shift so she typically is with the kids all day long and then she gets up in the middle the night.  She will often be up about 3 or 4 AM and realize she has not been to sleep.  And she is unable to sleep during the day because of her older son.  She has been taking the Zoloft 50 mg once know she could increase the dose.  She is no longer breast-feeding. sHe did have 1 visit with the therapist at her OB/GYN office but did not feel like that was beneficial.  She is open to psychotherapy to help her going forward.   She has established with Dr. Humberto Seals for telemedicine for her rheumatoid arthritis, she has some upcoming labs  She needs to reestablish with her gastroenterologist Dr. Tarri Glenn  Review Of Systems:  GEN- denies fatigue, fever, weight loss,weakness, recent illness HEENT- denies eye drainage, change in vision, nasal discharge, CVS- denies chest pain, palpitations RESP- denies SOB, cough, wheeze ABD- denies N/V, change in stools, abd pain GU- denies dysuria, hematuria, dribbling, incontinence MSK- denies joint pain, muscle aches, injury Neuro- denies headache, dizziness, syncope, seizure activity       Objective:    BP 120/68   Pulse 100   Temp 98.7 F (37.1 C) (Temporal)   Resp 16   Ht 5' 8"  (1.727 m)   Wt  239 lb (108.4 kg)   LMP 10/18/2019 (Exact Date) Comment: irregular due to recent pregnancy  SpO2 96%   Breastfeeding Yes Comment: only as newborn wants  BMI 36.34 kg/m  GEN- NAD, alert and oriented x3 HEENT- PERRL, EOMI, non injected sclera, pink conjunctiva,  Neck- Supple, no thyromegaly CVS- RRR, no murmur RESP-CTAB ABD-NABS,soft,NT,ND Psych- normal affect and mood EXT- No edema Pulses- Radial, 2+  PHQ-9 score 17 no suicidal ideations      Assessment & Plan:      Problem List Items Addressed This Visit      Unprioritized   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[place])    Establish care with Dr. Tarri Glenn.  I will send a MyChart message to see if they can arrange appointment for her.      Depression with anxiety - Primary    Crease Zoloft to 100 mg once a day.  Melatonin at bedtime for sleep she can start at 3 mg.  Referral for psychotherapy.  We will follow-up via telemedicine in 3 weeks as this is easier for her with the 2 children at home.      Relevant Medications   sertraline (ZOLOFT) 100 MG tablet   Other Relevant Orders   Ambulatory referral to Psychology      Note: This dictation  was prepared with Dragon dictation along with smaller phrase technology. Any transcriptional errors that result from this process are unintentional.

## 2019-10-18 NOTE — Patient Instructions (Addendum)
Increase the sertaline to 18m  I will help you re-stablish with GI  Take melatonin 369mat bedtime  Referral to therapy  F/U 3 weeks -telehealth

## 2019-10-18 NOTE — Assessment & Plan Note (Signed)
Establish care with Dr. Tarri Glenn.  I will send a MyChart message to see if they can arrange appointment for her.

## 2019-10-18 NOTE — Assessment & Plan Note (Signed)
Crease Zoloft to 100 mg once a day.  Melatonin at bedtime for sleep she can start at 3 mg.  Referral for psychotherapy.  We will follow-up via telemedicine in 3 weeks as this is easier for her with the 2 children at home.

## 2019-10-18 NOTE — Progress Notes (Signed)
Office Visit Note  Patient: Deanna [name]             Date of Birth: 1996/08/04           MRN: [id number]             PCP: [name], MD Referring: [name], MD Visit Date: 10/24/2019 Occupation: @GUAROCC @  Subjective:  Pain in back and hips.   History of Present Illness: Deanna [name] is a 24 y.o. female with history of JIA and Crohn's disease.  She states she continues to have discomfort in her back and her hips which she describes in the SI joint area.  She also has some discomfort in her shoulders which she believes is from lifting her baby.  She gets stiffness in her hands after doing dishes.  She denies any joint swelling.  She had a Crohn's flare in January per patient.  She denies any blood or diarrhea in her stool currently.  She has appointment coming up with Dr. Tarri Glenn.  Patient states during her childhood she had lot of pain and discomfort in her knee joints and ankle joints.  She used to use walker in the middle school.  Activities of Daily Living:  Patient reports morning stiffness for 20 minutes.   Patient Reports nocturnal pain.  Difficulty dressing/grooming: Denies Difficulty climbing stairs: Denies Difficulty getting out of chair: Denies Difficulty using hands for taps, buttons, cutlery, and/or writing: Denies  Review of Systems  Constitutional: Positive for fatigue. Negative for night sweats, weight gain and weight loss.  HENT: Negative for mouth sores, trouble swallowing, trouble swallowing, mouth dryness and nose dryness.   Eyes: Negative for pain, redness, itching, visual disturbance and dryness.  Respiratory: Negative for cough, shortness of breath, wheezing and difficulty breathing.   Cardiovascular: Negative for chest pain, palpitations, hypertension, irregular heartbeat and swelling in legs/feet.  Gastrointestinal: Negative for blood in stool, constipation and diarrhea.  Endocrine: Negative for increased urination.    Genitourinary: Negative for difficulty urinating, painful urination and vaginal dryness.  Musculoskeletal: Positive for arthralgias, joint pain and morning stiffness. Negative for joint swelling, myalgias, muscle weakness, muscle tenderness and myalgias.  Skin: Negative for color change, rash, hair loss, skin tightness, ulcers and sensitivity to sunlight.  Allergic/Immunologic: Negative for susceptible to infections.  Neurological: Positive for numbness and weakness. Negative for dizziness, headaches, memory loss and night sweats.  Hematological: Negative for bruising/bleeding tendency and swollen glands.  Psychiatric/Behavioral: Positive for sleep disturbance. Negative for depressed mood and confusion. The patient is nervous/anxious.     PMFS History:  Patient Active Problem List   Diagnosis Date Noted  . Preterm premature rupture of membranes (PPROM) with unknown onset of labor 09/11/2019  . Status post delivery at term 09/11/2019  . Obesity in pregnancy 09/06/2019  . H/O rapid labor 09/06/2019  . IUGR (intrauterine growth restriction) affecting care of mother 06/25/2019  . HGSIL (high grade squamous intraepithelial lesion) on Pap smear of cervix 04/12/2019  . Supervision of high risk pregnancy, antepartum 02/06/2019  . Depression with anxiety 10/27/2018  . Exposure to STD 07/07/2018  . Keloid 09/12/2013  . Acne vulgaris 09/12/2013  . 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[place]) 09/30/2012  . 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 Anxiety   . Arthritis, 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[place])   . Crohn disease ([place])    dx in 2012  . Depression   .  Hemorrhoid   . Infection    UTI x many    Family History  Problem Relation Age of Onset  . Diabetes Maternal Grandmother   . Gallstones Mother   . Anxiety disorder Mother   . Depression Mother   . Schizophrenia Father   . Eczema Father   . Stomach cancer Maternal  Aunt   . Diabetes Maternal Grandfather   . Heart disease Maternal Grandfather   . Stroke Maternal Grandfather   . Colon cancer Maternal Grandfather   . Healthy Son   . Healthy Son    Past Surgical History:  Procedure Laterality Date  . COLONOSCOPY     Social History   Social History Narrative  . Not on file   Immunization History  Administered Date(s) Administered  . DTaP 10/25/1996, 12/25/1996, 02/23/1997, 01/22/1998, 09/30/2001  . HPV 9-valent 07/17/2015  . HPV Quadrivalent 09/12/2013  . Hepatitis A, Ped/Adol-2 Dose 09/12/2013, 07/17/2015  . Hepatitis B 06-23-1996, 10/25/1996, 05/25/1997  . HiB (PRP-OMP) 10/25/1996, 12/25/1996, 02/23/1997, 01/22/1998  . IPV 10/25/1996, 12/25/1996, 08/27/1997, 09/30/2001  . Influenza,inj,Quad PF,6+ Mos 09/12/2013, 07/17/2015, 06/11/2016, 07/12/2019  . MMR 08/27/1997, 09/30/2001  . Meningococcal Conjugate 09/12/2013  . PPD Test 06/22/2014  . Tdap 04/02/2008, 09/01/2016, 07/12/2019  . Varicella 08/27/1997, 04/02/2008     Objective: Vital Signs: BP 118/72 (BP Location: Left Arm, Patient Position: Sitting, Cuff Size: Normal)   Pulse 79   Resp 13   Ht 5' 8"  (1.727 m)   Wt 240 lb (108.9 kg)   LMP 10/18/2019 (Exact Date)   BMI 36.49 kg/m    Physical Exam Vitals and nursing note reviewed.  Constitutional:      Appearance: She is well-developed.  HENT:     Head: Normocephalic and atraumatic.  Eyes:     Conjunctiva/sclera: Conjunctivae normal.  Cardiovascular:     Rate and Rhythm: Normal rate and regular rhythm.     Heart sounds: Normal heart sounds.  Pulmonary:     Effort: Pulmonary effort is normal.     Breath sounds: Normal breath sounds.  Abdominal:     General: Bowel sounds are normal.     Palpations: Abdomen is soft.  Musculoskeletal:     Cervical back: Normal range of motion.  Lymphadenopathy:     Cervical: No cervical adenopathy.  Skin:    General: Skin is warm and dry.     Capillary Refill: Capillary refill takes  less than 2 seconds.  Neurological:     Mental Status: She is alert and oriented to person, place, and time.  Psychiatric:        Behavior: Behavior normal.      Musculoskeletal Exam: She has good range of motion of her C-spine thoracic and lumbar spine.  She had discomfort on palpation of her thoracic and lumbar region.  She has some tenderness over SI joints.  She had tenderness over bilateral trapezius region and bilateral trochanteric bursa.  Shoulder joints, wrist joints were in good range of motion.  No MCP PIP or DIP swelling or synovitis was noted.  She had bilateral elbow joint contractures.  Hip joints, knee joints, ankles, MTPs and PIPs with good range of motion with no synovitis.  CDAI Exam: CDAI Score: -- Patient Global: --; Provider Global: -- Swollen: --; Tender: -- Joint Exam 10/24/2019   No joint exam has been documented for this visit   There is currently no information documented on the homunculus. Go to the Rheumatology activity and complete the homunculus joint exam.  Investigation:  No additional findings.  Imaging: XR Thoracic Spine 2 View  Result Date: 10/24/2019 No disc space narrowing was noted.  No syndesmophytes were noted. Impression: Unremarkable x-ray of the thoracic spine.  XR Foot 2 Views Left  Result Date: 10/24/2019 No MCP, PIP or DIP narrowing was noted.  No intertarsal tibiotalar joint space narrowing was noted.  No subtalar joint space narrowing was noted.  Calcification was noted in the distal portion of the tibia anteriorly in the soft tissue. Impression: Unremarkable x-ray of the foot and ankle joint.  Calcification noted over the distal tibia anteriorly in the soft tissue.  XR Foot 2 Views Right  Result Date: 10/24/2019 No MTP, PIP or DIP narrowing was noted.  No intertarsal, tibiotalar or subtalar joint space narrowing was noted.  Some calcification was noted anterior to the distal tibia in the soft tissue. Impression: Unremarkable x-ray of the  foot and ankle.  Calcification noted anterior to the distal tibia in the soft tissue.  XR Hand 2 View Left  Result Date: 10/24/2019 No MCP, PIP or DIP narrowing was noted.  Mild intercarpal and radiocarpal joint space narrowing was noted.  Cystic versus erosive changes were noted in the carpal bones. 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  XR Hand 2 View Right  Result Date: 10/24/2019 No MCP, PIP or DIP narrowing was noted.  Intercarpal and radiocarpal joint space narrowing was noted.  Cystic versus erosive changes were noted in the carpal bones. 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  XR Lumbar Spine 2-3 Views  Result Date: 10/24/2019 No disc space narrowing was noted.  No syndesmophytes were noted. Impression: Unremarkable x-ray of the lumbar spine.  XR Pelvis 1-2 Views  Result Date: 10/24/2019 Bilateral SI joint sclerosis was noted.  No erosive changes were noted. Impression: These findings are consistent with bilateral sclerosis.   Recent Labs: Lab Results  Component Value Date   WBC 8.9 10/20/2019   HGB 13.7 10/20/2019   PLT 245 10/20/2019   NA 137 10/20/2019   K 4.3 10/20/2019   CL 105 10/20/2019   CO2 26 10/20/2019   GLUCOSE 71 10/20/2019   BUN 14 10/20/2019   CREATININE 0.84 10/20/2019   BILITOT 0.4 10/20/2019   ALKPHOS 57 04/12/2019   AST 10 10/20/2019   ALT 9 10/20/2019   PROT 7.3 10/20/2019   PROT 7.4 10/20/2019   ALBUMIN 3.9 04/12/2019   CALCIUM 9.2 10/20/2019   GFRAA 114 10/20/2019   QFTBGOLDPLUS NEGATIVE 10/20/2019  October 20, 2019 SPEP consistent with inflammatory pattern, IgG elevated, hepatitis B-, hepatitis C negative, HIV negative, TB Gold negative, ESR 48, CK 49, TSH normal, ANA 1: 40 speckled, RF negative, anti-CCP negative, HLA-B27 negative  Speciality Comments: No specialty comments available.   Last office visit at [facility] on 08/31/14.    Diagnosed with JIA (psoriatic/Crohn's related) Diagnosed with Crohn's disease in 2012 (bx proven)  Enbrel +MTX combination (2012)-Then lost follow up until 2013 infliximab (last infusion 08/2013) 2015: MTX (25 mg po weekly), Arava 20 mg daily (noncompliance taking medications)  Switched from MTX to SSZ in 08/2014   Procedures:  No procedures performed Allergies: Patient has no known allergies.   Assessment / Plan:     Visit Diagnoses: Pain in thoracic spine -patient complains of discomfort in the thoracic region.  She has some point tenderness in the midthoracic region.  Plan: XR Thoracic Spine 2 View.  The x-ray of the thoracic spine  were unremarkable.  Chronic midline low back pain without sciatica -she complains of chronic lower back pain.  Plan: XR Lumbar Spine 2-3 Views.  X-ray of the lumbar spine were unremarkable.  Chronic SI joint pain - Plan: XR Pelvis 1-2 Views.  Bilateral SI joint sclerosis was noted.  No SI joint narrowing was noted.  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 [facility]) - Most March 2012.  Followed by Dr. Tarri Glenn.  Treated with IV Remicade from 2012 2014.  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  Treated with methotrexate, Arava, sulfasalazine in the past.  Treated with IV Remicade for Crohn's.  Ongoing arthralgias. - Plan: XR Hand 2 View Right, XR Hand 2 View Left, XR Foot 2 Views Right, XR Foot 2 Views Left.  X-ray of bilateral hands showed erosive changes in the carpal bones and also intercarpal and radiocarpal joint narrowing.  This indicates chronic inflammatory disease with erosive changes.  X-ray of bilateral feet and ankles were unremarkable.  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  No synovitis was noted over elbow joints.  Patient believes that she has had contractures in her elbows since she was in middle school.  High risk medication  use - Discussed possible use of Humira.  Patient would like to see Dr. Tarri Glenn before making a decision.  Myofascial pain-she has some trapezius and trochanteric bursa pain.  She is 1 month postpartum.  Other fatigue  Acne vulgaris  Depression with anxiety - She is on sertraline 10 mg p.o. nightly.  HGSIL (high grade squamous intraepithelial lesion) on Pap smear of cervix  Family history of psoriasis -  her father.  Orders: Orders Placed This Encounter  Procedures  . XR Thoracic Spine 2 View  . XR Lumbar Spine 2-3 Views  . XR Pelvis 1-2 Views  . XR Hand 2 View Right  . XR Hand 2 View Left  . XR Foot 2 Views Right  . XR Foot 2 Views Left   No orders of the defined types were placed in this encounter.   Face-to-face time spent with patient was 60 minutes. Greater than 50% of time was spent in counseling and coordination of care.  Follow-Up Instructions: Return in 2 months (on 12/22/2019) for Crohn's disease, inflammatory arthritis.   Bo Merino, MD  Note - This record has been created using Editor, commissioning.  Chart creation errors have been sought, but may not always  have been located. Such creation errors do not reflect on  the standard of medical care.

## 2019-10-20 ENCOUNTER — Other Ambulatory Visit: Payer: Self-pay | Admitting: *Deleted

## 2019-10-20 DIAGNOSIS — M0880 Other juvenile arthritis, unspecified site: Secondary | ICD-10-CM

## 2019-10-20 DIAGNOSIS — R5383 Other fatigue: Secondary | ICD-10-CM

## 2019-10-24 ENCOUNTER — Ambulatory Visit: Payer: Self-pay

## 2019-10-24 ENCOUNTER — Ambulatory Visit (INDEPENDENT_AMBULATORY_CARE_PROVIDER_SITE_OTHER): Payer: BC Managed Care – PPO | Admitting: Rheumatology

## 2019-10-24 ENCOUNTER — Other Ambulatory Visit: Payer: Self-pay

## 2019-10-24 ENCOUNTER — Encounter: Payer: Self-pay | Admitting: Rheumatology

## 2019-10-24 VITALS — BP 118/72 | HR 79 | Resp 13 | Ht 68.0 in | Wt 240.0 lb

## 2019-10-24 DIAGNOSIS — M546 Pain in thoracic spine: Secondary | ICD-10-CM

## 2019-10-24 DIAGNOSIS — M0880 Other juvenile arthritis, unspecified site: Secondary | ICD-10-CM | POA: Diagnosis not present

## 2019-10-24 DIAGNOSIS — F418 Other specified anxiety disorders: Secondary | ICD-10-CM | POA: Diagnosis not present

## 2019-10-24 DIAGNOSIS — M24522 Contracture, left elbow: Secondary | ICD-10-CM

## 2019-10-24 DIAGNOSIS — M7918 Myalgia, other site: Secondary | ICD-10-CM | POA: Diagnosis not present

## 2019-10-24 DIAGNOSIS — K50813 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: Secondary | ICD-10-CM | POA: Diagnosis not present

## 2019-10-24 DIAGNOSIS — R5383 Other fatigue: Secondary | ICD-10-CM

## 2019-10-24 DIAGNOSIS — M545 Low back pain: Secondary | ICD-10-CM | POA: Diagnosis not present

## 2019-10-24 DIAGNOSIS — M24521 Contracture, right elbow: Secondary | ICD-10-CM

## 2019-10-24 DIAGNOSIS — Z79899 Other long term (current) drug therapy: Secondary | ICD-10-CM

## 2019-10-24 DIAGNOSIS — M533 Sacrococcygeal disorders, not elsewhere classified: Secondary | ICD-10-CM

## 2019-10-24 DIAGNOSIS — R87613 High grade squamous intraepithelial lesion on cytologic smear of cervix (HGSIL): Secondary | ICD-10-CM

## 2019-10-24 DIAGNOSIS — L700 Acne vulgaris: Secondary | ICD-10-CM | POA: Diagnosis not present

## 2019-10-24 DIAGNOSIS — Z840 Family history of diseases of the skin and subcutaneous tissue: Secondary | ICD-10-CM

## 2019-10-24 DIAGNOSIS — G8929 Other chronic pain: Secondary | ICD-10-CM

## 2019-10-26 LAB — CBC WITH DIFFERENTIAL/PLATELET
Absolute Monocytes: 757 cells/uL (ref 200–950)
Basophils Absolute: 62 cells/uL (ref 0–200)
Basophils Relative: 0.7 %
Eosinophils Absolute: 401 cells/uL (ref 15–500)
Eosinophils Relative: 4.5 %
HCT: 41.3 % (ref 35.0–45.0)
Hemoglobin: 13.7 g/dL (ref 11.7–15.5)
Lymphs Abs: 4005 cells/uL — ABNORMAL HIGH (ref 850–3900)
MCH: 29.6 pg (ref 27.0–33.0)
MCHC: 33.2 g/dL (ref 32.0–36.0)
MCV: 89.2 fL (ref 80.0–100.0)
MPV: 11.3 fL (ref 7.5–12.5)
Monocytes Relative: 8.5 %
Neutro Abs: 3676 cells/uL (ref 1500–7800)
Neutrophils Relative %: 41.3 %
Platelets: 245 10*3/uL (ref 140–400)
RBC: 4.63 10*6/uL (ref 3.80–5.10)
RDW: 12.5 % (ref 11.0–15.0)
Total Lymphocyte: 45 %
WBC: 8.9 10*3/uL (ref 3.8–10.8)

## 2019-10-26 LAB — COMPLETE METABOLIC PANEL WITH GFR
AG Ratio: 1.1 (calc) (ref 1.0–2.5)
ALT: 9 U/L (ref 6–29)
AST: 10 U/L (ref 10–30)
Albumin: 3.8 g/dL (ref 3.6–5.1)
Alkaline phosphatase (APISO): 77 U/L (ref 31–125)
BUN: 14 mg/dL (ref 7–25)
CO2: 26 mmol/L (ref 20–32)
Calcium: 9.2 mg/dL (ref 8.6–10.2)
Chloride: 105 mmol/L (ref 98–110)
Creat: 0.84 mg/dL (ref 0.50–1.10)
GFR, Est African American: 114 mL/min/{1.73_m2} (ref >=60)
GFR, Est Non African American: 98 mL/min/{1.73_m2} (ref >=60)
Globulin: 3.5 g/dL (calc) (ref 1.9–3.7)
Glucose, Bld: 71 mg/dL (ref 65–99)
Potassium: 4.3 mmol/L (ref 3.5–5.3)
Sodium: 137 mmol/L (ref 135–146)
Total Bilirubin: 0.4 mg/dL (ref 0.2–1.2)
Total Protein: 7.3 g/dL (ref 6.1–8.1)

## 2019-10-26 LAB — HIV ANTIBODY (ROUTINE TESTING W REFLEX): HIV 1&2 Ab, 4th Generation: NONREACTIVE

## 2019-10-26 LAB — PROTEIN ELECTROPHORESIS, SERUM, WITH REFLEX
Albumin ELP: 3.6 g/dL — ABNORMAL LOW (ref 3.8–4.8)
Alpha 1: 0.4 g/dL — ABNORMAL HIGH (ref 0.2–0.3)
Alpha 2: 0.8 g/dL (ref 0.5–0.9)
Beta 2: 0.4 g/dL (ref 0.2–0.5)
Beta Globulin: 0.4 g/dL (ref 0.4–0.6)
Gamma Globulin: 1.8 g/dL — ABNORMAL HIGH (ref 0.8–1.7)
Total Protein: 7.4 g/dL (ref 6.1–8.1)

## 2019-10-26 LAB — HLA-B27 ANTIGEN: HLA-B27 Antigen: NEGATIVE

## 2019-10-26 LAB — CK: Total CK: 49 U/L (ref 29–143)

## 2019-10-26 LAB — RHEUMATOID FACTOR: Rheumatoid fact SerPl-aCnc: <14 IU/mL (ref <14)

## 2019-10-26 LAB — QUANTIFERON-TB GOLD PLUS
Mitogen-NIL: >10 IU/mL
NIL: 0.05 IU/mL
QuantiFERON-TB Gold Plus: NEGATIVE
TB1-NIL: 0.01 IU/mL
TB2-NIL: 0 IU/mL

## 2019-10-26 LAB — SEDIMENTATION RATE: Sed Rate: 48 mm/h — ABNORMAL HIGH (ref 0–20)

## 2019-10-26 LAB — HEPATITIS B CORE ANTIBODY, IGM: Hep B C IgM: NONREACTIVE

## 2019-10-26 LAB — HEPATITIS B SURFACE ANTIGEN: Hepatitis B Surface Ag: NONREACTIVE

## 2019-10-26 LAB — IGG, IGA, IGM
IgG (Immunoglobin G), Serum: 2011 mg/dL — ABNORMAL HIGH (ref 600–1640)
IgM, Serum: 82 mg/dL (ref 50–300)
Immunoglobulin A: 295 mg/dL (ref 47–310)

## 2019-10-26 LAB — ANA: Anti Nuclear Antibody (ANA): POSITIVE — AB

## 2019-10-26 LAB — TSH: TSH: 1.42 mIU/L

## 2019-10-26 LAB — ANTI-NUCLEAR AB-TITER (ANA TITER): ANA Titer 1: 1:40 {titer} — ABNORMAL HIGH

## 2019-10-26 LAB — 14-3-3 ETA PROTEIN: 14-3-3 eta Protein: <0.2 ng/mL (ref <0.2)

## 2019-10-26 LAB — HEPATITIS C ANTIBODY
Hepatitis C Ab: NONREACTIVE
SIGNAL TO CUT-OFF: 0.48 (ref <1.00)

## 2019-10-26 LAB — CYCLIC CITRUL PEPTIDE ANTIBODY, IGG: Cyclic Citrullin Peptide Ab: <16 UNITS

## 2019-10-26 NOTE — Progress Notes (Signed)
Sedimentation rate is still elevated.  All other labs are unremarkable.

## 2019-11-09 ENCOUNTER — Ambulatory Visit: Payer: BC Managed Care – PPO | Admitting: Gastroenterology

## 2019-11-15 ENCOUNTER — Encounter: Payer: Self-pay | Admitting: Family Medicine

## 2019-11-15 ENCOUNTER — Telehealth (INDEPENDENT_AMBULATORY_CARE_PROVIDER_SITE_OTHER): Payer: BC Managed Care – PPO | Admitting: Family Medicine

## 2019-11-15 DIAGNOSIS — F418 Other specified anxiety disorders: Secondary | ICD-10-CM

## 2019-11-15 MED ORDER — SERTRALINE HCL 100 MG PO TABS: 100.0000 mg | ORAL_TABLET | Freq: Every day | ORAL | 3 refills | Status: DC

## 2019-11-15 NOTE — Progress Notes (Signed)
Virtual Visit via Video Note  I connected with Deanna [name] on 11/15/19 at 2:24pm  by a video enabled telemedicine application and verified that I am speaking with the correct person using two identifiers.      Pt location: at home   Physician location:  In office, [facility], Vic Blackbird MD     On call: patient and physician   I discussed the limitations of evaluation and management by telemedicine and the availability of in person appointments. The patient expressed understanding and agreed to proceed.  History of Present Illness: Telehealth visit to follow-up medications.  She was seen approximately 4 weeks ago secondary to depression with anxiety.  Due to care for her children difficulty getting into office visit.  She has a 44-monthold at home as well as her older child.  She was also not sleeping well.  Had been taking Zoloft 50 mg but having worsening depression symptoms despite this medication.  I increased her Zoloft to 100 mg at the last visit.  Also recommended melatonin at bedtime and she was referred for psychotherapy.  Today she states that she can tell a difference in her anxiety and depression with the higher dose of Zoloft.  She feels like she can get through her day without feeling overwhelmed and when she does find herself getting that way she can stop and rethink things.  She is also starting a new job tomorrow she will be working from home and her family members will be caring for her children so she is looking forward to that contribution to her family.  Work regards to her sleep she is getting a few hours at a time with the baby sleeps at night.  She did try the melatonin but it made her too groggy when she had to keep getting up with the baby in the middle of the night.  She noticed that when she just turned off her phone especially with the higher dose of Zoloft she is able to fall asleep for a few hours at night.  She still is unable to nap during the  daytime.  She has not had any major side effects with the medication.  She had some headaches in the first couple weeks on the higher dose but that is actually improved.  She was seen by her rheumatologist due to increased back pain pelvic pain.  She had x-rays done which we reviewed.  She did have some sclerotic changes in the pelvis region and she had inflammatory arthritis changes in her hands.  Her rheumatologist feels like this may be due to her Crohn's been untreated so they were going to work with her gastroenterologist to find a medication that could possibly help treat the inflammation from both of her autoimmune disorders.  Observations/Objective: No acute distress noted smiling during visit.  Normal affect and mood normal speech.  No apparent hallucinations  Assessment and Plan: Major depression with anxiety continue Zoloft at 100 mg once a day.  Hold off on melatonin as it makes her more groggy. Her sleep has been improving though still difficult with an infant in general.  She will also be starting a new job which she is looking forward to.  We will follow-up in 2 months to see how she is doing with her medication.  Her goal is to be able to handle things without being on medication in the future.  She is also going to follow-up with psychotherapy they did call her she just  needed to given the insurance information and states that she will set up this appointment next week.  Follow Up Instructions:    I discussed the assessment and treatment plan with the patient. The patient was provided an opportunity to ask questions and all were answered. The patient agreed with the plan and demonstrated an understanding of the instructions.   The patient was advised to call back or seek an in-person evaluation if the symptoms worsen or if the condition fails to improve as anticipated.  I provided 17 minutes of non-face-to-face time during this encounter. End Time: 2:41pm   Vic Blackbird,  MD

## 2019-12-15 ENCOUNTER — Encounter: Payer: Self-pay | Admitting: Family Medicine

## 2019-12-21 ENCOUNTER — Encounter: Payer: Self-pay | Admitting: Family Medicine

## 2019-12-25 NOTE — Progress Notes (Deleted)
Office Visit Note  Patient: Deanna [name]             Date of Birth: 08/03/1996           MRN: [id number]             PCP: [name], MD Referring: [name], MD Visit Date: 12/27/2019 Occupation: @GUAROCC @  Subjective:  No chief complaint on file.   History of Present Illness: Deanna [name] is a 24 y.o. female ***   Activities of Daily Living:  Patient reports morning stiffness for *** {minute/hour:19697}.   Patient {ACTIONS;DENIES/REPORTS:21021675::"Denies"} nocturnal pain.  Difficulty dressing/grooming: {ACTIONS;DENIES/REPORTS:21021675::"Denies"} Difficulty climbing stairs: {ACTIONS;DENIES/REPORTS:21021675::"Denies"} Difficulty getting out of chair: {ACTIONS;DENIES/REPORTS:21021675::"Denies"} Difficulty using hands for taps, buttons, cutlery, and/or writing: {ACTIONS;DENIES/REPORTS:21021675::"Denies"}  No Rheumatology ROS completed.   PMFS History:  Patient Active Problem List   Diagnosis Date Noted  . Preterm premature rupture of membranes (PPROM) with unknown onset of labor 09/11/2019  . Status post delivery at term 09/11/2019  . Obesity in pregnancy 09/06/2019  . H/O rapid labor 09/06/2019  . IUGR (intrauterine growth restriction) affecting care of mother 06/25/2019  . HGSIL (high grade squamous intraepithelial lesion) on Pap smear of cervix 04/12/2019  . Supervision of high risk pregnancy, antepartum 02/06/2019  . Depression with anxiety 10/27/2018  . Exposure to STD 07/07/2018  . Keloid 09/12/2013  . Acne vulgaris 09/12/2013  . 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 Anxiety   . 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 Crohn disease ([place])    dx in 2012  . Depression   . Hemorrhoid   . Infection    UTI x many    Family History  Problem Relation Age of Onset  . Diabetes Maternal Grandmother   .  Gallstones Mother   . Anxiety disorder Mother   . Depression Mother   . Schizophrenia Father   . Eczema Father   . Stomach cancer Maternal Aunt   . Diabetes Maternal Grandfather   . Heart disease Maternal Grandfather   . Stroke Maternal Grandfather   . Colon cancer Maternal Grandfather   . Healthy Son   . Healthy Son    Past Surgical History:  Procedure Laterality Date  . COLONOSCOPY     Social History   Social History Narrative  . Not on file   Immunization History  Administered Date(s) Administered  . DTaP 10/25/1996, 12/25/1996, 02/23/1997, 01/22/1998, 09/30/2001  . HPV 9-valent 07/17/2015  . HPV Quadrivalent 09/12/2013  . Hepatitis A, Ped/Adol-2 Dose 09/12/2013, 07/17/2015  . Hepatitis B 10-09-1995, 10/25/1996, 05/25/1997  . HiB (PRP-OMP) 10/25/1996, 12/25/1996, 02/23/1997, 01/22/1998  . IPV 10/25/1996, 12/25/1996, 08/27/1997, 09/30/2001  . Influenza,inj,Quad PF,6+ Mos 09/12/2013, 07/17/2015, 06/11/2016, 07/12/2019  . MMR 08/27/1997, 09/30/2001  . Meningococcal Conjugate 09/12/2013  . PPD Test 06/22/2014  . Tdap 04/02/2008, 09/01/2016, 07/12/2019  . Varicella 08/27/1997, 04/02/2008     Objective: Vital Signs: There were no vitals taken for this visit.   Physical Exam   Musculoskeletal Exam: ***  CDAI Exam: CDAI Score: - Patient Global: -; Provider Global: - Swollen: -; Tender: - Joint Exam 12/27/2019   No joint exam has been documented for this visit   There is currently no information documented on the homunculus. Go to the Rheumatology activity and complete the homunculus joint exam.  Investigation: No additional findings.  Imaging: No results found.  Recent Labs: Lab Results  Component Value Date   WBC 8.9 10/20/2019   HGB 13.7 10/20/2019   PLT 245 10/20/2019   NA 137 10/20/2019   K 4.3 10/20/2019   CL 105 10/20/2019   CO2 26 10/20/2019   GLUCOSE 71 10/20/2019   BUN 14 10/20/2019   CREATININE 0.84 10/20/2019   BILITOT 0.4 10/20/2019    ALKPHOS 57 04/12/2019   AST 10 10/20/2019   ALT 9 10/20/2019   PROT 7.3 10/20/2019   PROT 7.4 10/20/2019   ALBUMIN 3.9 04/12/2019   CALCIUM 9.2 10/20/2019   GFRAA 114 10/20/2019   QFTBGOLDPLUS NEGATIVE 10/20/2019  Jerry 29 2021 SPEP consistent with chronic inflammatory response, IgG elevated, hepatitis B-, hepatitis C negative, HIV negative, TB Gold negative, ANA 1: 40 speckled, ESR 48, anti-CCP negative, RF negative, 43 3 eta negative, HLA-B27 negative  Speciality Comments: No specialty comments available.  Procedures:  No procedures performed Allergies: Patient has no known allergies.   Assessment / Plan:     Visit Diagnoses: No diagnosis found.  Orders: No orders of the defined types were placed in this encounter.  No orders of the defined types were placed in this encounter.   Face-to-face time spent with patient was *** minutes. Greater than 50% of time was spent in counseling and coordination of care.  Follow-Up Instructions: No follow-ups on file.   Bo Merino, MD  Note - This record has been created using Editor, commissioning.  Chart creation errors have been sought, but may not always  have been located. Such creation errors do not reflect on  the standard of medical care.

## 2019-12-27 ENCOUNTER — Ambulatory Visit: Payer: BC Managed Care – PPO | Admitting: Rheumatology

## 2020-02-02 ENCOUNTER — Encounter: Payer: Self-pay | Admitting: Family Medicine

## 2020-02-02 MED ORDER — SERTRALINE HCL 100 MG PO TABS: 150.0000 mg | ORAL_TABLET | Freq: Every day | ORAL | 3 refills | Status: DC

## 2020-03-02 ENCOUNTER — Other Ambulatory Visit: Payer: Self-pay | Admitting: Family Medicine

## 2020-03-03 ENCOUNTER — Other Ambulatory Visit: Payer: Self-pay | Admitting: Advanced Practice Midwife

## 2020-05-17 ENCOUNTER — Other Ambulatory Visit: Payer: Self-pay | Admitting: Family Medicine

## 2020-07-06 ENCOUNTER — Other Ambulatory Visit: Payer: Self-pay

## 2020-07-06 ENCOUNTER — Emergency Department (HOSPITAL_COMMUNITY)
Admission: EM | Admit: 2020-07-06 | Discharge: 2020-07-06 | Disposition: A | Discharge status: Home / Self Care | Payer: BC Managed Care – PPO | Attending: Emergency Medicine | Admitting: Emergency Medicine

## 2020-07-06 ENCOUNTER — Encounter (HOSPITAL_COMMUNITY): Payer: Self-pay | Admitting: *Deleted

## 2020-07-06 DIAGNOSIS — B9689 Other specified bacterial agents as the cause of diseases classified elsewhere: Secondary | ICD-10-CM | POA: Diagnosis not present

## 2020-07-06 DIAGNOSIS — R319 Hematuria, unspecified: Secondary | ICD-10-CM | POA: Diagnosis not present

## 2020-07-06 DIAGNOSIS — M5489 Other dorsalgia: Secondary | ICD-10-CM | POA: Diagnosis not present

## 2020-07-06 DIAGNOSIS — N760 Acute vaginitis: Secondary | ICD-10-CM | POA: Insufficient documentation

## 2020-07-06 DIAGNOSIS — M545 Low back pain, unspecified: Secondary | ICD-10-CM | POA: Diagnosis not present

## 2020-07-06 DIAGNOSIS — R457 State of emotional shock and stress, unspecified: Secondary | ICD-10-CM | POA: Diagnosis not present

## 2020-07-06 DIAGNOSIS — F17210 Nicotine dependence, cigarettes, uncomplicated: Secondary | ICD-10-CM | POA: Diagnosis not present

## 2020-07-06 DIAGNOSIS — N898 Other specified noninflammatory disorders of vagina: Secondary | ICD-10-CM | POA: Diagnosis present

## 2020-07-06 DIAGNOSIS — R52 Pain, unspecified: Secondary | ICD-10-CM | POA: Diagnosis not present

## 2020-07-06 DIAGNOSIS — N390 Urinary tract infection, site not specified: Secondary | ICD-10-CM

## 2020-07-06 LAB — URINALYSIS, ROUTINE W REFLEX MICROSCOPIC
Bilirubin Urine: NEGATIVE
Glucose, UA: NEGATIVE mg/dL
Ketones, ur: NEGATIVE mg/dL
Nitrite: NEGATIVE
Protein, ur: 100 mg/dL — AB
Specific Gravity, Urine: 1.014 (ref 1.005–1.030)
WBC, UA: >50 WBC/hpf — ABNORMAL HIGH (ref 0–5)
pH: 5 (ref 5.0–8.0)

## 2020-07-06 LAB — WET PREP, GENITAL
Sperm: NONE SEEN
Trich, Wet Prep: NONE SEEN
Yeast Wet Prep HPF POC: NONE SEEN

## 2020-07-06 LAB — POC URINE PREG, ED: Preg Test, Ur: NEGATIVE

## 2020-07-06 MED ORDER — METRONIDAZOLE 500 MG PO TABS: 500.0000 mg | ORAL_TABLET | Freq: Two times a day (BID) | ORAL | 0 refills | Status: DC

## 2020-07-06 MED ORDER — CEPHALEXIN 500 MG PO CAPS: 500.0000 mg | ORAL_CAPSULE | Freq: Four times a day (QID) | ORAL | 0 refills | Status: DC

## 2020-07-06 NOTE — ED Notes (Signed)
Patient has a urine culture in the main lab

## 2020-07-06 NOTE — ED Provider Notes (Addendum)
Deanna [name] DEPT Provider Note   CSN: [id number] Arrival date & time: 07/06/20  1602     History Chief Complaint  Patient presents with  . Back Pain    Deanna [name] is a 24 y.o. female.  The history is provided by the patient. No language interpreter was used.  Vaginal Discharge Quality:  White Severity:  Moderate Onset quality:  Gradual Timing:  Constant Chronicity:  New Relieved by:  Nothing Worsened by:  Nothing Ineffective treatments:  None tried Associated symptoms: no abdominal pain   Risk factors: no new sexual partner and no STI exposure   Pt complains of burning and discomfort with urination.  Pt reports she has had a vaginal discharge.  Pt has had BV in the past      Past Medical History:  Diagnosis Date  . Anxiety   . 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 Crohn disease ([place])    dx in 2012  . Depression   . Hemorrhoid   . Infection    UTI x many    Patient Active Problem List   Diagnosis Date Noted  . Preterm premature rupture of membranes (PPROM) with unknown onset of labor 09/11/2019  . Status post delivery at term 09/11/2019  . Obesity in pregnancy 09/06/2019  . H/O rapid labor 09/06/2019  . IUGR (intrauterine growth restriction) affecting care of mother 06/25/2019  . HGSIL (high grade squamous intraepithelial lesion) on Pap smear of cervix 04/12/2019  . Supervision of high risk pregnancy, antepartum 02/06/2019  . Depression with anxiety 10/27/2018  . Exposure to STD 07/07/2018  . Keloid 09/12/2013  . Acne vulgaris 09/12/2013  . 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 COLONOSCOPY       OB History    Gravida  3   Para  2   Term  1   Preterm  1   AB  1   Living  2     SAB  0   TAB  1   Ectopic  0   Multiple  0   Live Births  2             Family History  Problem Relation Age of Onset  . Diabetes Maternal Grandmother   . Gallstones Mother   . Anxiety disorder Mother   . Depression Mother   . Schizophrenia Father   . Eczema Father   . Stomach cancer Maternal Aunt   . Diabetes Maternal Grandfather   . Heart disease Maternal Grandfather   . Stroke Maternal Grandfather   . Colon cancer Maternal Grandfather   . Healthy Son   . Healthy Son     Social History   Tobacco Use  . Smoking status: Current Every Day Smoker    Types: E-cigarettes, Cigarettes  . Smokeless tobacco: Never Used  . Tobacco comment: vaping in high school  Vaping Use  . Vaping Use: Former  Substance Use Topics  . Alcohol use: No  . Drug use: No    Home Medications Prior to Admission medications   Medication Sig Start Date End Date Taking? Authorizing Provider  acetaminophen (TYLENOL) 500 MG tablet Take 1,000 mg by mouth every 6 (six) hours as needed for mild pain.   Yes [provider]  etonogestrel (NEXPLANON) 68 MG IMPL implant 1 each (68 mg total) by Subdermal route  once for 1 dose. December 2020 10/18/19  Yes [place], Modena Nunnery, MD  Melatonin 10 MG TABS Take 10 tablets by mouth daily as needed (sleep).   Yes [provider]  sertraline (ZOLOFT) 100 MG tablet TAKE 1 AND 1/2 TABLETS DAILY BY MOUTH Patient taking differently: Take 50-150 mg by mouth See admin instructions. Takes 150 mg in the morning and 50 mg at night as needed. 05/17/20  Yes [place], Modena Nunnery, MD  cephALEXin (KEFLEX) 500 MG capsule Take 1 capsule (500 mg total) by mouth 4 (four) times daily for 7 days. 07/06/20 07/13/20  Fransico Meadow, PA-C  ibuprofen (ADVIL) 600 MG tablet TAKE 1 TABLET BY MOUTH EVERY 6 HOURS Patient not taking: Reported on 07/06/2020 03/04/20   Cresen[place]-Dishmon, Joaquim Lai, CNM  metroNIDAZOLE (FLAGYL) 500 MG tablet Take 1 tablet (500 mg total) by mouth 2 (two) times daily for 7 days. 07/06/20 07/13/20  Fransico Meadow, PA-C     Allergies    Patient has no known allergies.  Review of Systems   Review of Systems  Gastrointestinal: Negative for abdominal pain.  Genitourinary: Positive for vaginal discharge.  All other systems reviewed and are negative.   Physical Exam Updated Vital Signs BP 121/72 (BP Location: Right Arm)   Pulse (!) 109   Temp 98.6 F (37 C) (Oral)   Resp 18   Ht 5' 7"  (1.702 m)   Wt 108.9 kg   SpO2 98%   BMI 37.59 kg/m   Physical Exam Vitals and nursing note reviewed.  Constitutional:      Appearance: She is well-developed.  HENT:     Head: Normocephalic.  Cardiovascular:     Rate and Rhythm: Normal rate.  Pulmonary:     Effort: Pulmonary effort is normal.  Abdominal:     General: Abdomen is flat. There is no distension.  Genitourinary:    General: Normal vulva.     Vagina: Vaginal discharge present.  Musculoskeletal:        General: Normal range of motion.     Cervical back: Normal range of motion.  Skin:    General: Skin is warm.  Neurological:     General: No focal deficit present.     Mental Status: She is alert and oriented to person, place, and time.  Psychiatric:        Mood and Affect: Mood normal.     ED Results / Procedures / Treatments   Labs (all labs ordered are listed, but only abnormal results are displayed) Labs Reviewed  WET PREP, GENITAL - Abnormal; Notable for the following components:      Result Value   Clue Cells Wet Prep HPF POC PRESENT (*)    WBC, Wet Prep HPF POC FEW (*)    All other components within normal limits  URINALYSIS, ROUTINE W REFLEX MICROSCOPIC - Abnormal; Notable for the following components:   APPearance CLOUDY (*)    Hgb urine dipstick MODERATE (*)    Protein, ur 100 (*)    Leukocytes,Ua LARGE (*)    WBC, UA >50 (*)    Bacteria, UA RARE (*)    All other components within normal limits  POC URINE PREG, ED  GC/CHLAMYDIA PROBE AMP ([place]) NOT AT [facility]    EKG None  Radiology No results  found.  Procedures Procedures (including critical care time)  Medications Ordered in ED Medications - No data to display  ED Course  I have reviewed the triage vital signs and the nursing notes.  Pertinent labs & imaging results that were available during my care of the patient were reviewed by me and considered in my medical decision making (see chart for details).    MDM Rules/Calculators/A&P                          MDM:  Pt counseled on uti and bv.  Pt given rx for flagyl and keflex.  Final Clinical Impression(s) / ED Diagnoses Final diagnoses:  Urinary tract infection without hematuria, site unspecified  Bacterial vaginitis    Rx / DC Orders ED Discharge Orders         Ordered    metroNIDAZOLE (FLAGYL) 500 MG tablet  2 times daily        07/06/20 1839    cephALEXin (KEFLEX) 500 MG capsule  4 times daily        07/06/20 1839        An After Visit Summary was printed and given to the patient.    Fransico Meadow, PA-C 07/06/20 1846    Fransico Meadow, PA-C 07/06/20 1849    Lajean Saver, MD 07/07/20 [phone]

## 2020-07-06 NOTE — ED Triage Notes (Signed)
BIB EMS lower back pain and rt flank, noticed blood in urine and pain, took OTC meds which upset her stomach. Has appointment Monday with PCP. Tylenol 1034m PTA. 146/88-102-100%

## 2020-07-06 NOTE — Discharge Instructions (Signed)
Return if any problems.

## 2020-07-08 ENCOUNTER — Ambulatory Visit (INDEPENDENT_AMBULATORY_CARE_PROVIDER_SITE_OTHER): Payer: BC Managed Care – PPO | Admitting: Gastroenterology

## 2020-07-08 ENCOUNTER — Other Ambulatory Visit (INDEPENDENT_AMBULATORY_CARE_PROVIDER_SITE_OTHER): Payer: BC Managed Care – PPO

## 2020-07-08 ENCOUNTER — Encounter: Payer: Self-pay | Admitting: Gastroenterology

## 2020-07-08 VITALS — BP 116/68 | HR 108 | Ht 68.0 in | Wt 279.0 lb

## 2020-07-08 DIAGNOSIS — K50819 Crohn's disease of both small and large intestine with unspecified complications: Secondary | ICD-10-CM

## 2020-07-08 DIAGNOSIS — G8929 Other chronic pain: Secondary | ICD-10-CM

## 2020-07-08 DIAGNOSIS — R1013 Epigastric pain: Secondary | ICD-10-CM

## 2020-07-08 LAB — IBC + FERRITIN
Ferritin: 72.2 ng/mL (ref 10.0–291.0)
Iron: 10 ug/dL — ABNORMAL LOW (ref 42–145)
Saturation Ratios: 3.6 % — ABNORMAL LOW (ref 20.0–50.0)
Transferrin: 200 mg/dL — ABNORMAL LOW (ref 212.0–360.0)

## 2020-07-08 LAB — CBC WITH DIFFERENTIAL/PLATELET
Basophils Absolute: 0.1 10*3/uL (ref 0.0–0.1)
Basophils Relative: 0.5 % (ref 0.0–3.0)
Eosinophils Absolute: 0.1 10*3/uL (ref 0.0–0.7)
Eosinophils Relative: 0.6 % (ref 0.0–5.0)
HCT: 38.4 % (ref 36.0–46.0)
Hemoglobin: 12.9 g/dL (ref 12.0–15.0)
Lymphocytes Relative: 4.8 % — ABNORMAL LOW (ref 12.0–46.0)
Lymphs Abs: 0.6 10*3/uL — ABNORMAL LOW (ref 0.7–4.0)
MCHC: 33.5 g/dL (ref 30.0–36.0)
MCV: 87.2 fl (ref 78.0–100.0)
Monocytes Absolute: 1 10*3/uL (ref 0.1–1.0)
Monocytes Relative: 7.6 % (ref 3.0–12.0)
Neutro Abs: 10.8 10*3/uL — ABNORMAL HIGH (ref 1.4–7.7)
Neutrophils Relative %: 86.5 % — ABNORMAL HIGH (ref 43.0–77.0)
Platelets: 193 10*3/uL (ref 150.0–400.0)
RBC: 4.4 Mil/uL (ref 3.87–5.11)
RDW: 14.7 % (ref 11.5–15.5)
WBC: 12.5 10*3/uL — ABNORMAL HIGH (ref 4.0–10.5)

## 2020-07-08 LAB — GC/CHLAMYDIA PROBE AMP (~~LOC~~) NOT AT ARMC
Chlamydia: NEGATIVE
Comment: NEGATIVE
Comment: NORMAL
Neisseria Gonorrhea: NEGATIVE

## 2020-07-08 LAB — COMPREHENSIVE METABOLIC PANEL
ALT: 17 U/L (ref 0–35)
AST: 21 U/L (ref 0–37)
Albumin: 3.5 g/dL (ref 3.5–5.2)
Alkaline Phosphatase: 89 U/L (ref 39–117)
BUN: 13 mg/dL (ref 6–23)
CO2: 23 mEq/L (ref 19–32)
Calcium: 9 mg/dL (ref 8.4–10.5)
Chloride: 105 mEq/L (ref 96–112)
Creatinine, Ser: 0.77 mg/dL (ref 0.40–1.20)
GFR: 108.17 mL/min (ref >60.00)
Glucose, Bld: 99 mg/dL (ref 70–99)
Potassium: 3.7 mEq/L (ref 3.5–5.1)
Sodium: 136 mEq/L (ref 135–145)
Total Bilirubin: 0.4 mg/dL (ref 0.2–1.2)
Total Protein: 7.3 g/dL (ref 6.0–8.3)

## 2020-07-08 LAB — TIQ-MISC

## 2020-07-08 MED ORDER — PLENVU 140 G PO SOLR: 140.0000 g | ORAL | 0 refills | Status: DC

## 2020-07-08 NOTE — Progress Notes (Signed)
Dr. Laverta Baltimore,  Referring Provider: [name], MD Primary Care Physician:  [name], MD   Chief complaint: Crohn's disease   IMPRESSION:  Epigastric Abdominal pain Fistulizing Crohns Disease    -Diagnosed in 2012 when she presented with abdominal pain, bloody stools, and a 60 pound weight loss    - Colon biopsies 2013 showed normal random biopsies except for focal erosion at the IC valve       - Procedure performed at [facility], procedure note not available in [place]       - Has been treated with Remicade, methotrexate, and Arava    - Off therapy for at least 3 years    - MRE 09/23/12:  severe inflammatory changes of the lower pelvis with multiple enteroenteric fistulas       - inflammatory changes of the terminal ileum, sigmoid colon, and mesentery  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 .   Labs from earlier this year were obtained in preparation for immunosuppressive treatment. Additional screening for iron deficiency and vitamin D deficiency recommended. Fecal calprotectin to use moving forward as a marker of disease activity.   I am recommending combination therapy with Humira and azathioprine. Will continue azathioprine for at least 12-24 months before discontinuation.  The patient remembers have a much better response to remicade and would prefer this to Humira. We discussed the conveniences for Humira. Will screen for infliximab antibodies, although my preference for compliance would be Humira.      PLAN: - Fecal calprotectin - CMP, CBC, iron, ferritin, vitamin D - Infliximab antibodies - EGD/Colonoscopy - CT enterography  - She will obtain her vaccination history from her  mother - Return to clinic after the endoscopy  HPI: Deanna [name] is a 24 y.o. female with a history of JIA/psoriatic arthritis and Crohn's disease previously on MTX and Arava.  She was seen in consultation 11/16/18. An MRE was performed in March. Did not keep telemedicine follow-up visit planned 12/15/18 or reschedule the appointment. I was contacted by Dr. Buelah Manis who requested that she be seen for symptoms of Crohn's during pregnancy. She was last seen when [redacted] weeks pregnant 07/2019. Follow-up in 2-4 weeks was recommended at that time. She is now followed by Dr. Humberto Seals for rheumatoid arthritis.  Her son is now 87 months old and very healthy.  IBD history: Diagnosed in 2012 presenting with abdominal pain, bloody diarrhea, 60 pound weight loss; followed at [facility] until 2015; MRE 11/22/18 showed A 7.8 cm segment of thickened and enhancing terminal ileum extends to the cecum, and likely represents moderately inflamed active Crohn's disease, radiologist unable to exclude fistula Surgical history: None  Current medications: None Prior medications: Enbrel +MTX combination (2012)-Then lost follow up until 2013; infliximab (last infusion 08/2013); 2015: MTX (25 mg po weekly), Arava 20 mg daily (noncompliance taking medications);  Switched from MTX to SSZ in 08/2014  Last colonoscopy:  Extraintestinal manifestations: JIA (Dr. Estanislado Pandy) Other medical history: None Vaccination history: Unknown   Her symptoms were well controlled on MTX and Arava.  She remembers the Remicade being the most helpful treatment.  She was also treated with Bentyl.  However she has been off medications for years.  She had no significant flare during two pregnancies.  Was seen in the ED 07/06/20 for symptoms attributed to  UTI. Reports night sweats, chills, back pain.  Nonradiating, epigastric abdominal pain. No diarrhea. No blood or mucous in the stool. Poor appetite. Her fiancee makes her eat. She has severe fatigue.   She  reports that her pregnancy is going well. Dr. Buelah Manis prescribed Ollen Bowl. Trying to eat more. Was not eating much before.  Review of epic shows IUGR.  Having legs, hips, and back pain. No other extra GI manifestations of IBD. Seen by Dr. Estanislado Pandy 10/2019. Treatment initiation deferred by the patient until she returned to this clinic.   She is starting a job with [employer] next month. Hoping to get things figured out before then.   Labs 10/20/19: Negative HCV, HBsAG, HBcAb total, HIV, QuantiFERON-TB Gold   ESR 48   Past Medical History:  Diagnosis Date  . Anxiety   . 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 Crohn disease ([place])    dx in 2012  . Depression   . Hemorrhoid   . Infection    UTI x many    Past Surgical History:  Procedure Laterality Date  . COLONOSCOPY      Current Outpatient Medications  Medication Sig Dispense Refill  . acetaminophen (TYLENOL) 500 MG tablet Take 1,000 mg by mouth every 6 (six) hours as needed for mild pain.    . cephALEXin (KEFLEX) 500 MG capsule Take 1 capsule (500 mg total) by mouth 4 (four) times daily for 7 days. 28 capsule 0  . etonogestrel (NEXPLANON) 68 MG IMPL implant 1 each (68 mg total) by Subdermal route once for 1 dose. December 2020 1 each 0  . ibuprofen (ADVIL) 600 MG tablet TAKE 1 TABLET BY MOUTH EVERY 6 HOURS (Patient not taking: Reported on 07/06/2020) 30 tablet 0  . Melatonin 10 MG TABS Take 10 tablets by mouth daily as needed (sleep).    . metroNIDAZOLE (FLAGYL) 500 MG tablet Take 1 tablet (500 mg total) by mouth 2 (two) times daily for 7 days. 14 tablet 0  . sertraline (ZOLOFT) 100 MG tablet TAKE 1 AND 1/2 TABLETS DAILY BY MOUTH (Patient taking differently: Take 50-150 mg by mouth See admin instructions. Takes 150 mg in the morning and 50 mg at night as needed.) 135 tablet 0   No current facility-administered medications for this visit.    Allergies as of 07/08/2020  . (No Known Allergies)    Family History  Problem  Relation Age of Onset  . Diabetes Maternal Grandmother   . Gallstones Mother   . Anxiety disorder Mother   . Depression Mother   . Schizophrenia Father   . Eczema Father   . Stomach cancer Maternal Aunt   . Diabetes Maternal Grandfather   . Heart disease Maternal Grandfather   . Stroke Maternal Grandfather   . Colon cancer Maternal Grandfather   . Healthy Son   . Healthy Son     Social History   Socioeconomic History  . Marital status: Single    Spouse name: Not on file  . Number of children: 1  . Years of education: Not on file  . Highest education level: Not on file  Occupational History  . Occupation: Ship broker  Tobacco Use  . Smoking status: Current Every Day Smoker    Types: E-cigarettes, Cigarettes  . Smokeless tobacco: Never Used  . Tobacco comment: vaping in high school  Vaping Use  . Vaping Use: Former  Substance and Sexual Activity  . Alcohol use: No  . Drug use: No  . Sexual activity:  Yes    Birth control/protection: None  Other Topics Concern  . Not on file  Social History Narrative  . Not on file   Social Determinants of Health   Financial Resource Strain:   . Difficulty of Paying Living Expenses: Not on file  Food Insecurity:   . Worried About Charity fundraiser in the Last Year: Not on file  . Ran Out of Food in the Last Year: Not on file  Transportation Needs:   . Lack of Transportation (Medical): Not on file  . Lack of Transportation (Non-Medical): Not on file  Physical Activity:   . Days of Exercise per Week: Not on file  . Minutes of Exercise per Session: Not on file  Stress:   . Feeling of Stress : Not on file  Social Connections:   . Frequency of Communication with Friends and Family: Not on file  . Frequency of Social Gatherings with Friends and Family: Not on file  . Attends Religious Services: Not on file  . Active Member of Clubs or Organizations: Not on file  . Attends Archivist Meetings: Not on file  . Marital  Status: Not on file  Intimate Partner Violence:   . Fear of Current or Ex-Partner: Not on file  . Emotionally Abused: Not on file  . Physically Abused: Not on file  . Sexually Abused: Not on file    Filed Weights   07/08/20 1017  Weight: 279 lb (126.6 kg)    Physical Exam: Vital signs were reviewed. General:   Alert, well-nourished, pleasant and cooperative in NAD Head:  Normocephalic and atraumatic. Eyes:  Sclera clear, no icterus.   Conjunctiva pink. Mouth:  No deformity or lesions.   Neck:  Supple; no thyromegaly. Lungs:  Clear throughout to auscultation.   No wheezes.  Heart:  Regular rate and rhythm; no murmurs Abdomen: Soft, central obesity, mild epigastric tender, normal bowel sounds. No rebound or guarding. No hepatosplenomegaly Rectal:  Deferred  Msk:  Symmetrical without gross deformities. Extremities:  No gross deformities or edema. Neurologic:  Alert and  oriented x4;  grossly nonfocal Skin:  No rash or bruise. Psych:  Alert and cooperative. Normal mood and affect.   Lautaro Koral L. Tarri Glenn, MD, MPH [facility] 07/08/2020, 10:21 AM

## 2020-07-08 NOTE — Patient Instructions (Addendum)
If you are age 24 or older, your body mass index should be between 23-30. Your Body mass index is 42.42 kg/m. If this is out of the aforementioned range listed, please consider follow up with your Primary Care Provider.  If you are age 6 or younger, your body mass index should be between 19-25. Your Body mass index is 42.42 kg/m. If this is out of the aformentioned range listed, please consider follow up with your Primary Care Provider.   You have been scheduled for an endoscopy and colonoscopy. Please follow the written instructions given to you at your visit today. Please pick up your prep supplies at the pharmacy within the next 1-3 days. If you use inhalers (even only as needed), please bring them with you on the day of your procedure.  Your provider has requested that you go to the basement level for lab work before leaving today. Press "B" on the elevator. The lab is located at the first door on the left as you exit the elevator.  Due to recent changes in healthcare laws, you may see the results of your imaging and laboratory studies on MyChart before your provider has had a chance to review them.  We understand that in some cases there may be results that are confusing or concerning to you. Not all laboratory results come back in the same time frame and the provider may be waiting for multiple results in order to interpret others.  Please give [place] 48 hours in order for your provider to thoroughly review all the results before contacting the office for clarification of your results.   You have been scheduled for a CT scan of the abdomen and pelvis at [facility].   You are scheduled on 07-16-2020 at 4pm. You should arrive at 245pm to your appointment time for registration and for drinking the contrast. Please follow the written instructions below on the day of your exam:  WARNING: IF YOU ARE ALLERGIC TO IODINE/X-RAY DYE, PLEASE NOTIFY RADIOLOGY IMMEDIATELY AT [phone]! YOU WILL  BE GIVEN A 13 HOUR PREMEDICATION PREP.  1) Do not eat or drink anything after 12/noon (4 hours prior to your test) You may take any medications as prescribed with a small amount of water, if necessary. If you take any of the following medications: METFORMIN, GLUCOPHAGE, GLUCOVANCE, AVANDAMET, RIOMET, FORTAMET, [place] MET, JANUMET, GLUMETZA or METAGLIP, you MAY be asked to HOLD this medication 48 hours AFTER the exam.  The purpose of you drinking the oral contrast is to aid in the visualization of your intestinal tract. The contrast solution may cause some diarrhea. Depending on your individual set of symptoms, you may also receive an intravenous injection of x-ray contrast/dye. Plan on being at [facility] for 30 minutes or longer, depending on the type of exam you are having performed.  This test typically takes 30-45 minutes to complete.  If you have any questions regarding your exam or if you need to reschedule, you may call the CT department at [phone] between the hours of 8:00 am and 5:00 pm, Monday-Friday.  ________________________________________________________________________  Thank you for trusting me with your gastrointestinal care!    Thornton Park, MD, MPH  ________________________________________________________________________

## 2020-07-09 ENCOUNTER — Encounter: Payer: Self-pay | Admitting: Family Medicine

## 2020-07-09 ENCOUNTER — Other Ambulatory Visit: Payer: BC Managed Care – PPO

## 2020-07-09 ENCOUNTER — Ambulatory Visit (INDEPENDENT_AMBULATORY_CARE_PROVIDER_SITE_OTHER): Payer: BC Managed Care – PPO | Admitting: Family Medicine

## 2020-07-09 ENCOUNTER — Other Ambulatory Visit: Payer: Self-pay

## 2020-07-09 VITALS — BP 112/68 | HR 100 | Temp 98.0°F | Resp 14 | Ht 68.0 in | Wt 277.0 lb

## 2020-07-09 DIAGNOSIS — G8929 Other chronic pain: Secondary | ICD-10-CM

## 2020-07-09 DIAGNOSIS — R319 Hematuria, unspecified: Secondary | ICD-10-CM | POA: Diagnosis not present

## 2020-07-09 DIAGNOSIS — N760 Acute vaginitis: Secondary | ICD-10-CM

## 2020-07-09 DIAGNOSIS — K50819 Crohn's disease of both small and large intestine with unspecified complications: Secondary | ICD-10-CM

## 2020-07-09 DIAGNOSIS — B9689 Other specified bacterial agents as the cause of diseases classified elsewhere: Secondary | ICD-10-CM

## 2020-07-09 DIAGNOSIS — N390 Urinary tract infection, site not specified: Secondary | ICD-10-CM | POA: Diagnosis not present

## 2020-07-09 DIAGNOSIS — E860 Dehydration: Secondary | ICD-10-CM | POA: Diagnosis not present

## 2020-07-09 LAB — URINALYSIS, ROUTINE W REFLEX MICROSCOPIC
Bilirubin Urine: NEGATIVE
Glucose, UA: NEGATIVE
Hyaline Cast: NONE SEEN /LPF
Nitrite: NEGATIVE
RBC / HPF: >=60 /HPF — AB (ref 0–2)
Specific Gravity, Urine: 1.02 (ref 1.001–1.03)
pH: 6.5 (ref 5.0–8.0)

## 2020-07-09 LAB — MICROSCOPIC MESSAGE

## 2020-07-09 MED ORDER — CEFTRIAXONE SODIUM 1 G IJ SOLR
1.0000 g | Freq: Once | INTRAMUSCULAR | Status: AC
  Administered 2020-07-09: 1 g via INTRAMUSCULAR

## 2020-07-09 MED ORDER — METRONIDAZOLE 0.75 % VA GEL: 1.0000 | Freq: Every day | VAGINAL | 0 refills | Status: DC

## 2020-07-09 MED ORDER — ONDANSETRON 4 MG PO TBDP: 4.0000 mg | ORAL_TABLET | Freq: Three times a day (TID) | ORAL | 0 refills | Status: DC | PRN

## 2020-07-09 NOTE — Progress Notes (Signed)
   Subjective:    Patient ID: Deanna [name], female    DOB: Jan 19, 1996, 24 y.o.   MRN: [id number]  Patient presents for ABD Pain (burning in abd- taking ABTX from ER, but makes her nauseaous and dizzy) and Repeaat UTI   Pt went to ER this past weekend she felt weak and shaking from chills, she had dsyuria , she had numbness in finger tips and swelling in her feet She called EMS,they evauated her BP was a little elevated, had severe back pain by that time Along wit the UTI symptoms. When elevated in ER also noted to have vaginal discharge Dx was UTI given Keflex 536m QID x 7 days and Flagyl for BV She has not been table to keep all the antibiotics down, flagyl makes her very nauseated, she has missed 3 doses of keflex so far.  No diarrhea, but vomiting and decerased appetite, she has been trying to drink some, but urine is dark .   She has seen GI yesterday , she is scheduled for CT and EGD, had labs done yesterday   Review Of Systems:  GEN- denies fatigue, fever, weight loss,weakness, recent illness HEENT- denies eye drainage, change in vision, nasal discharge, CVS- denies chest pain, palpitations RESP- denies SOB, cough, wheeze ABD- denies N/V, change in stools, abd pain GU- + dysuria, hematuria, dribbling, incontinence MSK- denies joint pain, +muscle aches, injury Neuro- denies headache, dizziness, syncope, seizure activity       Objective:    BP 112/68   Pulse 100   Temp 98 F (36.7 C) (Temporal)   Resp 14   Ht 5' 8"  (1.727 m)   Wt 277 lb (125.6 kg)   SpO2 99%   BMI 42.12 kg/m  GEN- NAD, alert and oriented x3 HEENT- PERRL, EOMI, non injected sclera, pink conjunctiva, dry MM , oropharynx clear Neck- Supple, no thyromegaly CVS- RR, mild tachycardia , no murmur RESP-CTAB ABD-NABS,soft,mild TTP lower abdomen, no rebound no guarding , no CVA tenderness NEURO- CNII-XII in tact, no focal deficits MSK- Mild TTP lumbar spine, good ROM EXT- No edema Pulses- Radial  2+        Assessment & Plan:      Problem List Items Addressed This Visit    None    Visit Diagnoses    Urinary tract infection with hematuria, site unspecified    -  Primary   Unable to tolerate all her antibiotics, given ROcephin 1 gram in office, zofran for nausea, dehydration NS via IV given in office 1L bolus given. Start keflex 5073mBID x 5 more days F/u labs tomorrow with her GI She  Is already scheduled for CT scan Has nexplanon in for contraception, u preg neg    Relevant Orders   Urinalysis, Routine w reflex microscopic   Dehydration       BV (bacterial vaginosis)       D/C Flagyl due to GI upset, given metrogel vaginally x 5 days       Note: This dictation was prepared with Dragon dictation along with smaller phrase technology. Any transcriptional errors that result from this process are unintentional.

## 2020-07-09 NOTE — Patient Instructions (Addendum)
F/u as needed Given Rocephin for UTI Starting tomorrow take keflex 534m twice a day for 5 days  Stop the flagyl Use Metrogel as prescribed  Use zofran as needed for nausea

## 2020-07-12 LAB — CALPROTECTIN, FECAL: Calprotectin, Fecal: 189 ug/g — ABNORMAL HIGH (ref 0–120)

## 2020-07-16 ENCOUNTER — Other Ambulatory Visit: Payer: Self-pay

## 2020-07-16 ENCOUNTER — Ambulatory Visit (HOSPITAL_COMMUNITY)
Admission: RE | Admit: 2020-07-16 | Discharge: 2020-07-16 | Disposition: A | Discharge status: Home / Self Care | Payer: BC Managed Care – PPO | Source: Ambulatory Visit | Attending: Gastroenterology | Admitting: Gastroenterology

## 2020-07-16 ENCOUNTER — Encounter (HOSPITAL_COMMUNITY): Payer: Self-pay

## 2020-07-16 DIAGNOSIS — K529 Noninfective gastroenteritis and colitis, unspecified: Secondary | ICD-10-CM | POA: Diagnosis not present

## 2020-07-16 DIAGNOSIS — K5939 Other megacolon: Secondary | ICD-10-CM | POA: Diagnosis not present

## 2020-07-16 DIAGNOSIS — K50819 Crohn's disease of both small and large intestine with unspecified complications: Secondary | ICD-10-CM | POA: Diagnosis not present

## 2020-07-16 DIAGNOSIS — R1013 Epigastric pain: Secondary | ICD-10-CM | POA: Insufficient documentation

## 2020-07-16 DIAGNOSIS — K6389 Other specified diseases of intestine: Secondary | ICD-10-CM | POA: Diagnosis not present

## 2020-07-16 DIAGNOSIS — G8929 Other chronic pain: Secondary | ICD-10-CM | POA: Insufficient documentation

## 2020-07-16 DIAGNOSIS — K3580 Unspecified acute appendicitis: Secondary | ICD-10-CM | POA: Diagnosis not present

## 2020-07-16 MED ORDER — IOHEXOL 300 MG/ML  SOLN
100.0000 mL | Freq: Once | INTRAMUSCULAR | Status: AC | PRN
  Administered 2020-07-16: 100 mL via INTRAVENOUS

## 2020-07-16 MED ORDER — BARIUM SULFATE 0.1 % PO SUSP
ORAL | Status: AC
  Filled 2020-07-16: qty 3

## 2020-07-16 MED ORDER — BARIUM SULFATE 0.1 % PO SUSP
450.0000 mL | Freq: Once | ORAL | Status: AC
  Administered 2020-07-16: 450 mL via ORAL

## 2020-07-17 ENCOUNTER — Telehealth: Payer: Self-pay

## 2020-07-17 NOTE — Telephone Encounter (Signed)
Results have been received. Placed in [place] in-box

## 2020-07-17 NOTE — Telephone Encounter (Signed)
Received call report from radiology regarding CT entero. Report not visible in epic, states exam ended. Rad tech to fax report. Impression states positive for mild active inflammatory changes in RLQ of abdomen. Dr. Tarri Glenn notified.

## 2020-07-17 NOTE — Telephone Encounter (Signed)
Noted. Await the full report. Thanks.

## 2020-07-17 NOTE — Telephone Encounter (Signed)
Thank you :)

## 2020-07-23 LAB — TEST AUTHORIZATION

## 2020-07-23 LAB — VITAMIN D 1,25 DIHYDROXY
Vitamin D 1, 25 (OH)2 Total: 39 pg/mL (ref 18–72)
Vitamin D2 1, 25 (OH)2: <8 pg/mL
Vitamin D3 1, 25 (OH)2: 39 pg/mL

## 2020-07-23 LAB — INFLIXIMAB ANTI-DRUG AB FOR IBD: Infliximab ADA, IBD: <10 AU (ref <10)

## 2020-07-26 ENCOUNTER — Other Ambulatory Visit: Payer: Self-pay

## 2020-07-26 ENCOUNTER — Ambulatory Visit (AMBULATORY_SURGERY_CENTER): Payer: BC Managed Care – PPO | Admitting: Gastroenterology

## 2020-07-26 ENCOUNTER — Encounter: Payer: Self-pay | Admitting: Gastroenterology

## 2020-07-26 VITALS — BP 137/84 | HR 98 | Temp 96.6°F | Resp 15 | Ht 68.0 in | Wt 279.0 lb

## 2020-07-26 DIAGNOSIS — K2289 Other specified disease of esophagus: Secondary | ICD-10-CM | POA: Diagnosis not present

## 2020-07-26 DIAGNOSIS — R1013 Epigastric pain: Secondary | ICD-10-CM

## 2020-07-26 DIAGNOSIS — K50919 Crohn's disease, unspecified, with unspecified complications: Secondary | ICD-10-CM | POA: Diagnosis not present

## 2020-07-26 DIAGNOSIS — K2970 Gastritis, unspecified, without bleeding: Secondary | ICD-10-CM

## 2020-07-26 DIAGNOSIS — K633 Ulcer of intestine: Secondary | ICD-10-CM | POA: Diagnosis not present

## 2020-07-26 DIAGNOSIS — K3189 Other diseases of stomach and duodenum: Secondary | ICD-10-CM | POA: Diagnosis not present

## 2020-07-26 MED ORDER — SODIUM CHLORIDE 0.9 % IV SOLN: 500.0000 mL | INTRAVENOUS | Status: DC

## 2020-07-26 NOTE — Op Note (Signed)
[place] Patient Name: Deanna [name] Procedure Date: 07/26/2020 11:39 AM MRN: [id number] Endoscopist: Thornton Park MD, MD Age: 24 Referring MD:  Date of Birth: [birth year] Gender: Female Account #: [id number] Procedure:                Colonoscopy Indications:              Abdominal pain, Follow-up of Crohn's disease of the                            small bowel and colon Medicines:                Monitored Anesthesia Care Procedure:                Pre-Anesthesia Assessment:                           - Prior to the procedure, a History and Physical                            was performed, and patient medications and                            allergies were reviewed. The patient's tolerance of                            previous anesthesia was also reviewed. The risks                            and benefits of the procedure and the sedation                            options and risks were discussed with the patient.                            All questions were answered, and informed consent                            was obtained. Prior Anticoagulants: The patient has                            taken no previous anticoagulant or antiplatelet                            agents. ASA Grade Assessment: II - A patient with                            mild systemic disease. After reviewing the risks                            and benefits, the patient was deemed in                            satisfactory condition to undergo the procedure.                           -  Prior to the procedure, a History and Physical                            was performed, and patient medications and                            allergies were reviewed. The patient's tolerance of                            previous anesthesia was also reviewed. The risks                            and benefits of the procedure and the sedation                            options and risks were discussed with  the patient.                            All questions were answered, and informed consent                            was obtained. Prior Anticoagulants: The patient has                            taken no previous anticoagulant or antiplatelet                            agents. ASA Grade Assessment: II - A patient with                            mild systemic disease. After reviewing the risks                            and benefits, the patient was deemed in                            satisfactory condition to undergo the procedure.                           After obtaining informed consent, the colonoscope                            was passed under direct vision. Throughout the                            procedure, the patient's blood pressure, pulse, and                            oxygen saturations were monitored continuously. The                            Colonoscope was introduced through the anus and  advanced to the 2 cm into the ileum. The                            colonoscopy was performed without difficulty. The                            patient tolerated the procedure well. The quality                            of the bowel preparation was good. The terminal                            ileum, ileocecal valve, appendiceal orifice, and                            rectum were photographed. Scope In: 11:59:53 AM Scope Out: 12:13:17 PM Scope Withdrawal Time: 0 hours 12 minutes 9 seconds  Total Procedure Duration: 0 hours 13 minutes 24 seconds  Findings:                 The perianal and digital rectal examinations were                            normal except for small internal hemorrhoids.                           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                            The colonic mucosa otherwise appeared normal.                            Biopsies were  taken throughout the colon with a                            cold forceps for histology. Estimated blood loss                            was minimal.                           It was difficult to intubate the terminal ileum due                            to angulation and scarring. The visualized distal                            terminal ileum was ulceration. Multiple                            pseudopolyps were present on the proximal side of  the IC valve. No bleeding was present. Biopsies                            were taken with a cold forceps for histology.                            Estimated blood loss was minimal.                           The exam was otherwise without abnormality on                            direct and retroflexion views. Complications:            No immediate complications. Estimated blood loss:                            Minimal. Estimated Blood Loss:     Estimated blood loss was minimal. 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                           - The entire examined colon is otherwise normal.                            Biopsied.                           - Multiple ulcers in the distal ileum. Biopsied.                           - The examination was otherwise normal on direct                            and retroflexion views. Recommendation:           - Patient has a contact number available for                            emergencies. The signs and symptoms of potential                            delayed complications were discussed with the                            patient. Return to normal activities tomorrow.                            Written discharge instructions were provided to the                            patient.                           - Resume previous diet.                           -  Continue present medications.                           - Will work to start infliximab and  azathioprine.                           - Referral to IBD clinic to establish care.                           - Await pathology results. Thornton Park MD, MD 07/26/2020 12:36:27 PM This report has been signed electronically.

## 2020-07-26 NOTE — Progress Notes (Signed)
Pt's states no medical or surgical changes since previsit or office visit. 

## 2020-07-26 NOTE — Op Note (Signed)
[place] Patient Name: Deanna [name] Procedure Date: 07/26/2020 11:39 AM MRN: [id number] Endoscopist: Thornton Park MD, MD Age: 24 Referring MD:  Date of Birth: 1996/09/11 Gender: Female Account #: [id number] Procedure:                Upper GI endoscopy Indications:              Abdominal pain Medicines:                Monitored Anesthesia Care Procedure:                Pre-Anesthesia Assessment:                           - Prior to the procedure, a History and Physical                            was performed, and patient medications and                            allergies were reviewed. The patient's tolerance of                            previous anesthesia was also reviewed. The risks                            and benefits of the procedure and the sedation                            options and risks were discussed with the patient.                            All questions were answered, and informed consent                            was obtained. Prior Anticoagulants: The patient has                            taken no previous anticoagulant or antiplatelet                            agents. ASA Grade Assessment: II - A patient with                            mild systemic disease. After reviewing the risks                            and benefits, the patient was deemed in                            satisfactory condition to undergo the procedure.                           After obtaining informed consent, the endoscope was  passed under direct vision. Throughout the                            procedure, the patient's blood pressure, pulse, and                            oxygen saturations were monitored continuously. The                            Endoscope was introduced through the mouth, and                            advanced to the third part of duodenum. The upper                            GI endoscopy was accomplished  without difficulty.                            The patient tolerated the procedure well. Scope In: Scope Out: Findings:                 The examined esophagus was normal. Biopsies were                            taken with a cold forceps for histology. Estimated                            blood loss was minimal.                           Diffuse mildly erythematous mucosa was found in the                            gastric body. Biopsies were taken from the antrum,                            body, and fundus with a cold forceps for histology.                            Estimated blood loss was minimal.                           The examined duodenum was normal. Biopsies were                            taken with a cold forceps for histology. Estimated                            blood loss was minimal.                           The cardia and gastric fundus were normal on  retroflexion.                           The exam was otherwise without abnormality. Complications:            No immediate complications. Estimated blood loss:                            Minimal. Estimated Blood Loss:     Estimated blood loss was minimal. Impression:               - Normal esophagus. Biopsied.                           - Mild erythematous mucosa in the gastric body.                            Biopsied.                           - Normal examined duodenum. Biopsied.                           - The examination was otherwise normal. Recommendation:           - Patient has a contact number available for                            emergencies. The signs and symptoms of potential                            delayed complications were discussed with the                            patient. Return to normal activities tomorrow.                            Written discharge instructions were provided to the                            patient.                           - Resume previous  diet.                           - Continue present medications.                           - Await pathology results. Thornton Park MD, MD 07/26/2020 12:26:50 PM This report has been signed electronically.

## 2020-07-26 NOTE — Progress Notes (Signed)
Called to room to assist during endoscopic procedure.  Patient ID and intended procedure confirmed with present staff. Received instructions for my participation in the procedure from the performing physician.  

## 2020-07-26 NOTE — Progress Notes (Signed)
pt tolerated well. VSS. awake and to recovery. Report given to RN. Oral bite block inserted and removed without trauma.

## 2020-07-26 NOTE — Patient Instructions (Signed)
Thank you for allowing [place] to care for you today!  Await final biopsy results, approximately 7-10 days.  Resume previous diet and medications today.  Referral to IBD clinic to establish care.  Will be working to start Infliximab and azathioprine.  Return to your normal daily activities tomorrow, Saturday 07/27/2020.       YOU HAD AN ENDOSCOPIC PROCEDURE TODAY AT [facility]:   Refer to the procedure report that was given to you for any specific questions about what was found during the examination.  If the procedure report does not answer your questions, please call your gastroenterologist to clarify.  If you requested that your care partner not be given the details of your procedure findings, then the procedure report has been included in a sealed envelope for you to review at your convenience later.  YOU SHOULD EXPECT: Some feelings of bloating in the abdomen. Passage of more gas than usual.  Walking can help get rid of the air that was put into your GI tract during the procedure and reduce the bloating. If you had a lower endoscopy (such as a colonoscopy or flexible sigmoidoscopy) you may notice spotting of blood in your stool or on the toilet paper. If you underwent a bowel prep for your procedure, you may not have a normal bowel movement for a few days.  Please Note:  You might notice some irritation and congestion in your nose or some drainage.  This is from the oxygen used during your procedure.  There is no need for concern and it should clear up in a day or so.  SYMPTOMS TO REPORT IMMEDIATELY:   Following lower endoscopy (colonoscopy or flexible sigmoidoscopy):  Excessive amounts of blood in the stool  Significant tenderness or worsening of abdominal pains  Swelling of the abdomen that is new, acute  Fever of 100F or higher   Following upper endoscopy (EGD)  Vomiting of blood or coffee ground material  New chest pain or pain under the shoulder  blades  Painful or persistently difficult swallowing  New shortness of breath  Fever of 100F or higher  Black, tarry-looking stools  For urgent or emergent issues, a gastroenterologist can be reached at any hour by calling [phone]. Do not use MyChart messaging for urgent concerns.    DIET:  We do recommend a small meal at first, but then you may proceed to your regular diet.  Drink plenty of fluids but you should avoid alcoholic beverages for 24 hours.  ACTIVITY:  You should plan to take it easy for the rest of today and you should NOT DRIVE or use heavy machinery until tomorrow (because of the sedation medicines used during the test).    FOLLOW UP: Our staff will call the number listed on your records 48-72 hours following your procedure to check on you and address any questions or concerns that you may have regarding the information given to you following your procedure. If we do not reach you, we will leave a message.  We will attempt to reach you two times.  During this call, we will ask if you have developed any symptoms of COVID 19. If you develop any symptoms (ie: fever, flu-like symptoms, shortness of breath, cough etc.) before then, please call [phone].  If you test positive for Covid 19 in the 2 weeks post procedure, please call and report this information to [place].    If any biopsies were taken you will be contacted by phone or by  letter within the next 1-3 weeks.  Please call us at [phone] if you have not heard about the biopsies in 3 weeks.    SIGNATURES/CONFIDENTIALITY: You and/or your care partner have signed paperwork which will be entered into your electronic medical record.  These signatures attest to the fact that that the information above on your After Visit Summary has been reviewed and is understood.  Full responsibility of the confidentiality of this discharge information lies with you and/or your care-partner.

## 2020-07-29 ENCOUNTER — Other Ambulatory Visit: Payer: Self-pay

## 2020-07-29 ENCOUNTER — Telehealth: Payer: Self-pay

## 2020-07-29 DIAGNOSIS — K50919 Crohn's disease, unspecified, with unspecified complications: Secondary | ICD-10-CM

## 2020-07-29 NOTE — Telephone Encounter (Signed)
The patient wants to start infliximab, knowing that she may have antibodies (although recent labs were negative she has not had exposure to the medication in years). She felt it worked better in the past. Thank you.

## 2020-07-29 NOTE — Telephone Encounter (Signed)
Dr. Tarri Glenn the procedure reports states you want to start the pt on infliximab and azathioprine. The OV note states Humira and azathioprine. Please advise which on you want to start the patient on .

## 2020-07-30 ENCOUNTER — Telehealth: Payer: Self-pay | Admitting: *Deleted

## 2020-07-30 ENCOUNTER — Telehealth: Payer: Self-pay

## 2020-07-30 NOTE — Telephone Encounter (Signed)
Pts insurance will not cover pt getting Remicade in the hospital Referral faxed to [facility] to start the approval process.

## 2020-07-30 NOTE — Telephone Encounter (Signed)
Left message on answering machine. 

## 2020-07-30 NOTE — Telephone Encounter (Signed)
No answer/VM full.

## 2020-08-01 NOTE — Telephone Encounter (Signed)
Pts insurance will not cover Remicade. [facility] states pts insurance will cover Inflectra. Please advise if ok to sent orders for Inflectra.

## 2020-08-01 NOTE — Telephone Encounter (Signed)
If that is what her insurance will cover, will proceed with the Remicade biosimilar, Inflectra. Thanks.

## 2020-08-02 NOTE — Telephone Encounter (Signed)
Inflectra plan of care faxed to [facility].

## 2020-08-04 ENCOUNTER — Other Ambulatory Visit: Payer: Self-pay | Admitting: Family Medicine

## 2020-08-07 ENCOUNTER — Telehealth: Payer: Self-pay | Admitting: Gastroenterology

## 2020-08-07 NOTE — Telephone Encounter (Signed)
Thank you for the update!

## 2020-08-07 NOTE — Telephone Encounter (Signed)
[facility] called and state that the pts insurance actually did wind up approving the Remicade. She should be scheduled to start the remicade.

## 2020-08-07 NOTE — Telephone Encounter (Signed)
Deanna [name] is requesting a call back to confirm that they still able to proceed with the Remicade

## 2020-08-07 NOTE — Telephone Encounter (Signed)
[facility] called to advise that the insurance did pay for Remicade for this patient so they will proceed with that medication

## 2020-08-12 ENCOUNTER — Other Ambulatory Visit: Payer: Self-pay

## 2020-08-12 ENCOUNTER — Telehealth: Payer: Self-pay

## 2020-08-12 DIAGNOSIS — K50919 Crohn's disease, unspecified, with unspecified complications: Secondary | ICD-10-CM

## 2020-08-12 DIAGNOSIS — K219 Gastro-esophageal reflux disease without esophagitis: Secondary | ICD-10-CM

## 2020-08-12 MED ORDER — PANTOPRAZOLE SODIUM 40 MG PO TBEC: DELAYED_RELEASE_TABLET | ORAL | 0 refills | Status: DC

## 2020-08-12 NOTE — Telephone Encounter (Signed)
Received notification from [place] our office that pt 1st tx't for Remicade has been scheduled for 08/21/20.

## 2020-08-27 ENCOUNTER — Encounter: Payer: BC Managed Care – PPO | Admitting: Gastroenterology

## 2020-09-03 ENCOUNTER — Other Ambulatory Visit: Payer: Self-pay | Admitting: Gastroenterology

## 2020-09-03 DIAGNOSIS — K50919 Crohn's disease, unspecified, with unspecified complications: Secondary | ICD-10-CM

## 2020-09-03 DIAGNOSIS — K219 Gastro-esophageal reflux disease without esophagitis: Secondary | ICD-10-CM

## 2020-09-27 ENCOUNTER — Ambulatory Visit: Payer: BC Managed Care – PPO | Admitting: Gastroenterology

## 2020-10-23 ENCOUNTER — Other Ambulatory Visit (INDEPENDENT_AMBULATORY_CARE_PROVIDER_SITE_OTHER): Payer: Medicaid Other

## 2020-10-23 ENCOUNTER — Ambulatory Visit (INDEPENDENT_AMBULATORY_CARE_PROVIDER_SITE_OTHER): Payer: Medicaid Other | Admitting: Gastroenterology

## 2020-10-23 ENCOUNTER — Encounter: Payer: Self-pay | Admitting: Gastroenterology

## 2020-10-23 VITALS — BP 138/70 | HR 111 | Ht 68.0 in | Wt 280.0 lb

## 2020-10-23 DIAGNOSIS — K219 Gastro-esophageal reflux disease without esophagitis: Secondary | ICD-10-CM

## 2020-10-23 DIAGNOSIS — Z79899 Other long term (current) drug therapy: Secondary | ICD-10-CM

## 2020-10-23 DIAGNOSIS — R1013 Epigastric pain: Secondary | ICD-10-CM | POA: Diagnosis not present

## 2020-10-23 DIAGNOSIS — D84821 Immunodeficiency due to drugs: Secondary | ICD-10-CM

## 2020-10-23 DIAGNOSIS — K50919 Crohn's disease, unspecified, with unspecified complications: Secondary | ICD-10-CM | POA: Diagnosis not present

## 2020-10-23 LAB — COMPREHENSIVE METABOLIC PANEL
ALT: 7 U/L (ref 0–35)
AST: 10 U/L (ref 0–37)
Albumin: 4.1 g/dL (ref 3.5–5.2)
Alkaline Phosphatase: 58 U/L (ref 39–117)
BUN: 8 mg/dL (ref 6–23)
CO2: 26 mEq/L (ref 19–32)
Calcium: 9.1 mg/dL (ref 8.4–10.5)
Chloride: 103 mEq/L (ref 96–112)
Creatinine, Ser: 0.68 mg/dL (ref 0.40–1.20)
GFR: 122.12 mL/min (ref >60.00)
Glucose, Bld: 86 mg/dL (ref 70–99)
Potassium: 3.8 mEq/L (ref 3.5–5.1)
Sodium: 134 mEq/L — ABNORMAL LOW (ref 135–145)
Total Bilirubin: 0.3 mg/dL (ref 0.2–1.2)
Total Protein: 7.9 g/dL (ref 6.0–8.3)

## 2020-10-23 LAB — CBC WITH DIFFERENTIAL/PLATELET
Basophils Absolute: 0.1 10*3/uL (ref 0.0–0.1)
Basophils Relative: 0.8 % (ref 0.0–3.0)
Eosinophils Absolute: 0.2 10*3/uL (ref 0.0–0.7)
Eosinophils Relative: 1.9 % (ref 0.0–5.0)
HCT: 40.9 % (ref 36.0–46.0)
Hemoglobin: 13.8 g/dL (ref 12.0–15.0)
Lymphocytes Relative: 40.7 % (ref 12.0–46.0)
Lymphs Abs: 3.4 10*3/uL (ref 0.7–4.0)
MCHC: 33.7 g/dL (ref 30.0–36.0)
MCV: 87.9 fl (ref 78.0–100.0)
Monocytes Absolute: 0.6 10*3/uL (ref 0.1–1.0)
Monocytes Relative: 7.1 % (ref 3.0–12.0)
Neutro Abs: 4.2 10*3/uL (ref 1.4–7.7)
Neutrophils Relative %: 49.5 % (ref 43.0–77.0)
Platelets: 236 10*3/uL (ref 150.0–400.0)
RBC: 4.65 Mil/uL (ref 3.87–5.11)
RDW: 14.4 % (ref 11.5–15.5)
WBC: 8.4 10*3/uL (ref 4.0–10.5)

## 2020-10-23 MED ORDER — PANTOPRAZOLE SODIUM 40 MG PO TBEC: 40.0000 mg | DELAYED_RELEASE_TABLET | Freq: Every morning | ORAL | 3 refills | Status: DC

## 2020-10-23 MED ORDER — AZATHIOPRINE 50 MG PO TABS: 50.0000 mg | ORAL_TABLET | Freq: Every day | ORAL | 3 refills | Status: DC

## 2020-10-23 NOTE — Patient Instructions (Addendum)
RECOMMENDATION(S):    Please abstain from smoking  Please obtain your vaccination history from your mother  I am recommending labs  I would like for you to continue Pantoprazole as ordered below:  PRESCRIPTION MEDICATION(S):   We have sent the following medication(s) to your pharmacy:  . Pantoprazole - please take 10m by mouth every morning . Azathioprine - please take 586mby mouth every day  . NOTE: If your medication(s) requires a PRIOR AUTHORIZATION, we will receive notification from your pharmacy. Once received, the process to submit for approval may take up to 7-10 business days. You will be contacted about any denials we have received from your insurance company as well as alternatives recommended by your provider.  LABS:   Your provider has requested that you go to the basement level for lab work before leaving today. Press "B" on the elevator. The lab is located at the first door on the left as you exit the elevator.  HEALTHCARE LAWS AND MY CHART RESULTS: Due to recent changes in healthcare laws, you may see the results of your imaging and laboratory studies on MyChart before your provider has had a chance to review them.  We understand that in some cases there may be results that are confusing or concerning to you. Not all laboratory results come back in the same time frame and the provider may be waiting for multiple results in order to interpret others.  Please give usKorea8 hours in order for your provider to thoroughly review all the results before contacting the office for clarification of your results.   FOLLOW UP:  I would like for you to follow up with me in 6-8 weeks. Please call the office at [phone] BMI:  If you are age 8028r older, your body mass index should be between 23-30. Your Body mass index is 42.57 kg/m. If this is out of the aforementioned range listed, please consider follow up with your Primary Care Provider.  If you are age 1782r younger,  your body mass index should be between 19-25. Your Body mass index is 42.57 kg/m. If this is out of the aformentioned range listed, please consider follow up with your Primary Care Provider.   Thank you for trusting me with your gastrointestinal care!    KiThornton ParkMD, MPH

## 2020-10-23 NOTE — Progress Notes (Signed)
Referring Provider: [name], MD Primary Care Physician:  [name], MD   Chief complaint: Crohn's disease   IMPRESSION:  Epigastric Abdominal pain Fistulizing Crohns Disease    -Diagnosed in 2012 when she presented with abdominal pain, bloody stools, and a 60 pound weight loss    - Colon biopsies 2013 showed normal random biopsies except for focal erosion at the IC valve       - Procedure performed at [facility], procedure note not available in [place]       - Has been treated with Remicade, methotrexate, and Arava    - Off therapy for at least 3 years    - MRE 09/23/12:  severe inflammatory changes of the lower pelvis with multiple enteroenteric fistulas       - inflammatory changes of the terminal ileum, sigmoid colon, and mesentery  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 Patient preferred Remicade, although I had originally recommended Humira and azathioprine. Will obtain levels and antibodies given her breakthrough symptoms. Will add azathioprine in the meantime. Discussed risks of long term immunosuppression.   We spent >3 minutes discussing the risks of smoking and counseling cessation.    PLAN: - Resume pantoprazole 40 mg QAM - Add azathioprine 50 mg daily - Fecal calprotectin - CMP, CBC with diff - Infliximab levels and antibodies - She will obtain her vaccination history from her mother - Smoking cessation recommended - Follow-up in 6-8 weeks  HPI: Deanna [name] is a 25 y.o. female with a history of JIA/psoriatic arthritis and Crohn's disease previously on MTX and Arava.  She is now followed by Dr. Humberto Seals for rheumatoid arthritis.     IBD history: Diagnosed in 2012 presenting with abdominal pain, bloody diarrhea, 60 pound weight loss; followed at [facility] until 2015; MRE 11/22/18 showed A 7.8 cm segment of thickened and  enhancing terminal ileum extends to the cecum, and likely represents moderately inflamed active Crohn's disease, radiologist unable to exclude fistula; no flares during pregnancy x 2  Surgical history: None  Current medications: None Prior medications: Enbrel +MTX combination (2012)-Then lost follow up until 2013; infliximab (last infusion 08/2013); 2015: MTX (25 mg po weekly), Arava 20 mg daily (noncompliance taking medications);  Switched from MTX to SSZ in 08/2014. Her symptoms were well controlled on MTX and Arava.  She remembers the Remicade being the most helpful treatment.   Last colonoscopy: 07/2020: normal colon, distal TI ulcers consistent with Crohn's Extraintestinal manifestations: JIA (Dr. Estanislado Pandy) Other medical history: None Vaccination history: Unknown  Labs 10/20/19: Negative HCV, HBsAG, HBcAb total, HIV, QuantiFERON-TB Gold   ESR 48  She is concerned that the Remicade isn't working. She has ongoing RUQ pain, constipation, and left knee pain.  Symptoms are improved for 2 days after her infusion but are then recurring.  No other extra GI manifestations of IBD.   She stopped her sertraline.   She has not seen the rheumatologist since her last visit here.   Is smoking Black and Mild FT filter tips a pack of 5 lasts 1-2 days.    Past Medical History:  Diagnosis Date  . Anxiety   . Arthritis, 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[place])   . Crohn disease ([place])    dx in 2012  . Depression   . Hemorrhoid   . Infection    UTI x many    Past Surgical History:  Procedure Laterality Date  . COLONOSCOPY    .  COLONOSCOPY  2014   [facility]  . TONSILLECTOMY      Current Outpatient Medications  Medication Sig Dispense Refill  . acetaminophen (TYLENOL) 500 MG tablet Take 1,000 mg by mouth every 6 (six) hours as needed for mild pain.    Marland Kitchen etonogestrel (NEXPLANON) 68 MG IMPL implant 1 each (68 mg total) by Subdermal route once for 1 dose. December 2020 1 each 0  . Melatonin 10 MG TABS Take 10  tablets by mouth daily as needed (sleep).    . ondansetron (ZOFRAN ODT) 4 MG disintegrating tablet Take 1 tablet (4 mg total) by mouth every 8 (eight) hours as needed for nausea or vomiting. 20 tablet 0  . pantoprazole (PROTONIX) 40 MG tablet TAKE 1 TABLET BY MOUTH EVERY MORNING FOR 8 WEEKS 30 tablet 1  . sertraline (ZOLOFT) 100 MG tablet TAKE 1 AND 1/2 TABLETS DAILY BY MOUTH DAILY 135 tablet 0   No current facility-administered medications for this visit.    Allergies as of 10/23/2020  . (No Known Allergies)    Family History  Problem Relation Age of Onset  . Diabetes Maternal Grandmother   . Gallstones Mother   . Anxiety disorder Mother   . Depression Mother   . Schizophrenia Father   . Eczema Father   . Stomach cancer Maternal Aunt   . Colon cancer Maternal Aunt   . Diabetes Maternal Grandfather   . Heart disease Maternal Grandfather   . Stroke Maternal Grandfather   . Colon cancer Maternal Grandfather   . Healthy Son   . Healthy Son   . Colon polyps Neg Hx   . Esophageal cancer Neg Hx   . Rectal cancer Neg Hx     Social History   Socioeconomic History  . Marital status: Single    Spouse name: Not on file  . Number of children: 1  . Years of education: Not on file  . Highest education level: Not on file  Occupational History  . Occupation: Ship broker  Tobacco Use  . Smoking status: Current Every Day Smoker    Types: Cigarettes  . Smokeless tobacco: Never Used  . Tobacco comment: vaping in high school  Vaping Use  . Vaping Use: Former  Substance and Sexual Activity  . Alcohol use: No  . Drug use: No  . Sexual activity: Yes    Birth control/protection: None  Other Topics Concern  . Not on file  Social History Narrative  . Not on file   Social Determinants of Health   Financial Resource Strain: Not on file  Food Insecurity: Not on file  Transportation Needs: Not on file  Physical Activity: Not on file  Stress: Not on file  Social Connections: Not on  file  Intimate Partner Violence: Not on file    [facility] Weights   10/23/20 1547  Weight: 280 lb (127 kg)    Physical Exam: Vital signs were reviewed. General:   Alert, well-nourished, pleasant and cooperative in NAD Head:  Normocephalic and atraumatic. Eyes:  Sclera clear, no icterus.   Conjunctiva pink. Abdomen: Soft, central obesity, mild epigastric tender, normal bowel sounds. No rebound or guarding. No hepatosplenomegaly Rectal:  Deferred  Msk:  Symmetrical without gross deformities. Extremities:  No gross deformities or edema. Neurologic:  Alert and  oriented x4;  grossly nonfocal Skin:  No rash or bruise. Psych:  Alert and cooperative. Normal mood and affect.   Jalan Bodi L. Tarri Glenn, MD, MPH [facility] 10/23/2020, 3:55 PM

## 2020-10-28 ENCOUNTER — Other Ambulatory Visit: Payer: Self-pay | Admitting: Family Medicine

## 2020-10-28 LAB — SERIAL MONITORING

## 2020-10-29 LAB — INFLIXIMAB+AB (SERIAL MONITOR)
Anti-Infliximab Antibody: <22 ng/mL
Infliximab Drug Level: 13 ug/mL

## 2020-11-25 ENCOUNTER — Other Ambulatory Visit: Payer: Self-pay | Admitting: Gastroenterology

## 2020-11-25 DIAGNOSIS — K50919 Crohn's disease, unspecified, with unspecified complications: Secondary | ICD-10-CM

## 2020-11-25 DIAGNOSIS — R1013 Epigastric pain: Secondary | ICD-10-CM

## 2020-11-26 IMAGING — US OBSTETRIC <14 WK US AND TRANSVAGINAL OB US
1 series · 15 of 28 positions shown · non-contrast
Comparison: None.

CLINICAL DATA: Evaluate early pregnancy.  Unsure of dates.

EXAM:
OBSTETRIC <14 WK US AND TRANSVAGINAL OB US
TECHNIQUE: Transvaginal ultrasound was performed for complete evaluation of the
gestation as well as the maternal uterus, adnexal regions, and
pelvic cul-de-sac.

[Series 1: obstetric <14 wk us and transvaginal ob us · 15 of 67 slices shown]
[im 1/67]
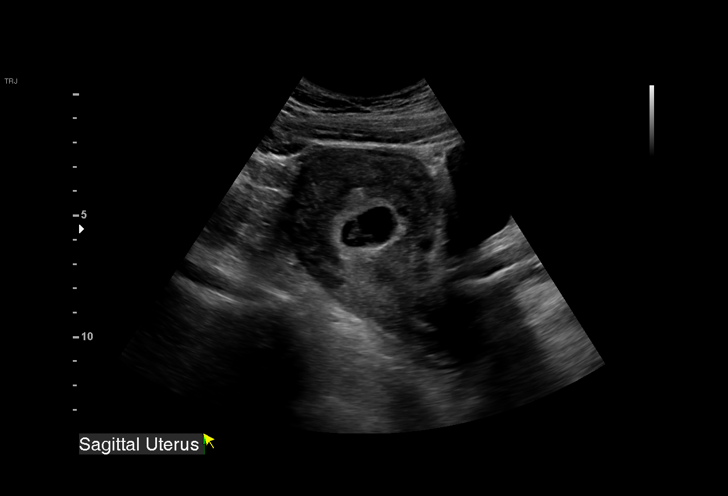
[im 5/67]
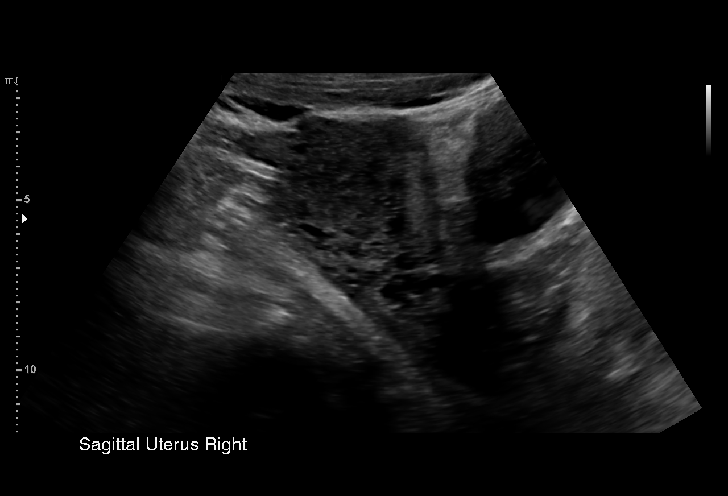
[im 10/67]
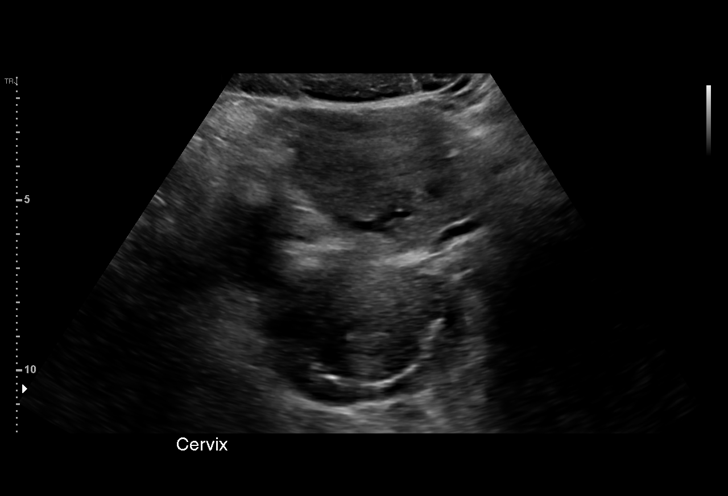
[im 15/67]
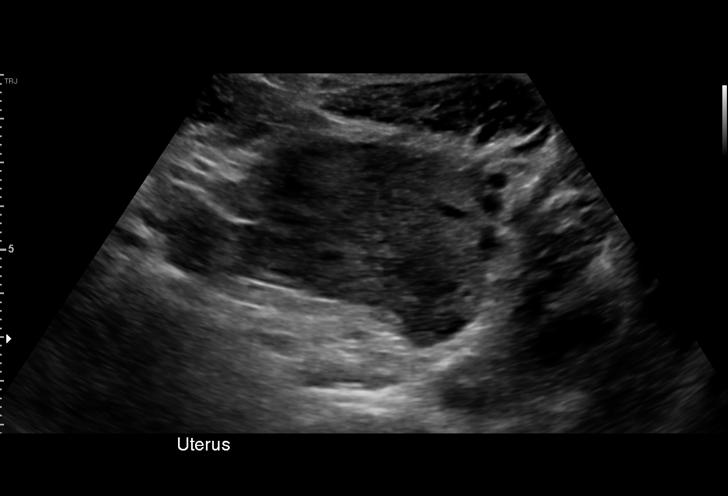
[im 20/67]
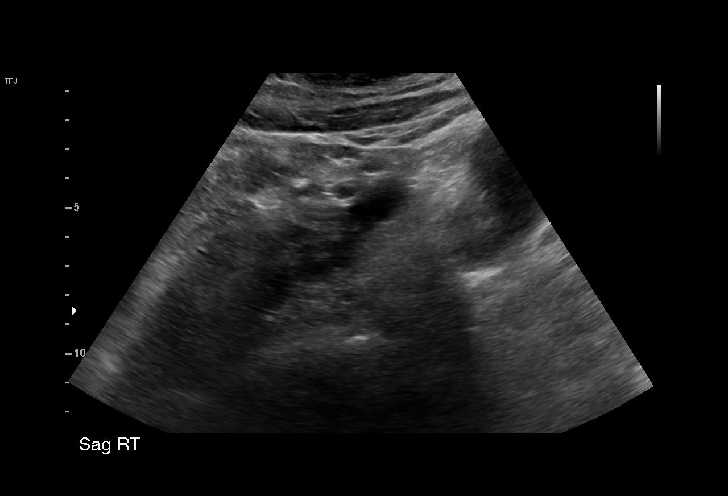
[im 25/67]
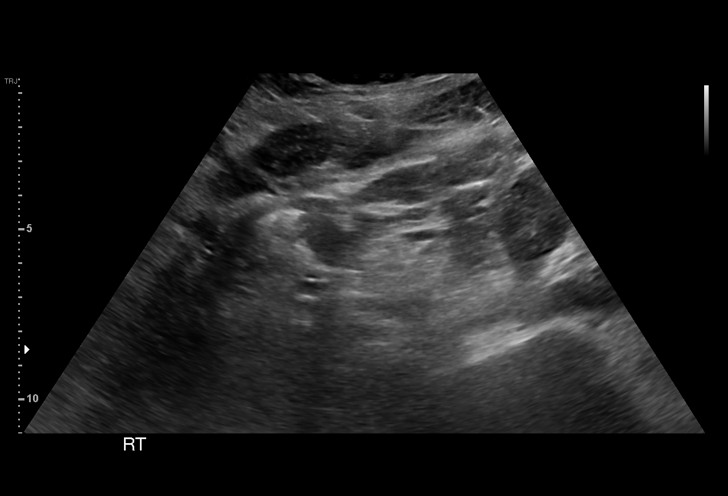
[im 30/67]
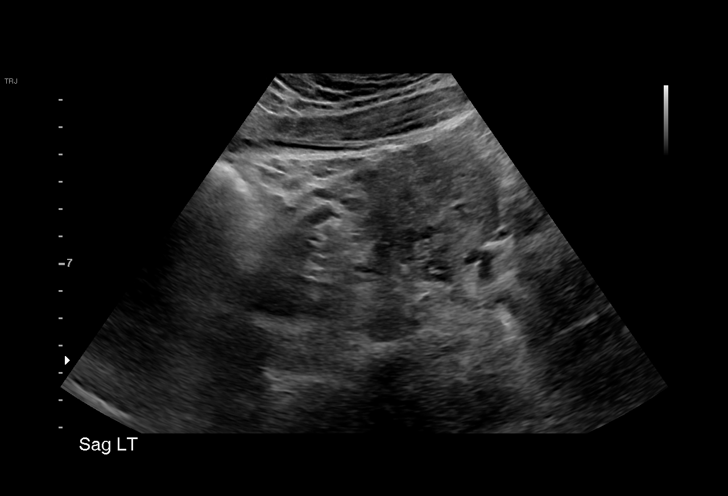
[im 35/67]
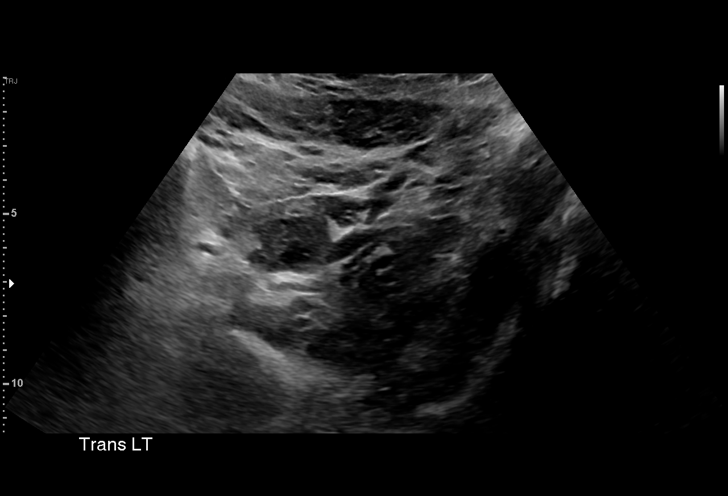
[im 37/67]
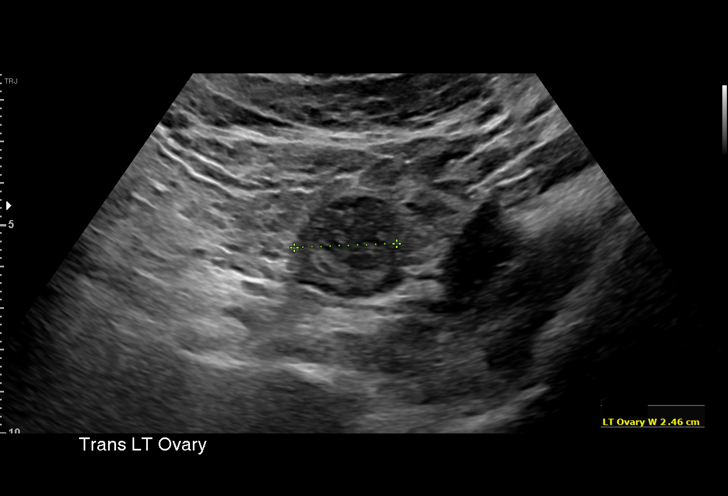
[im 42/67]
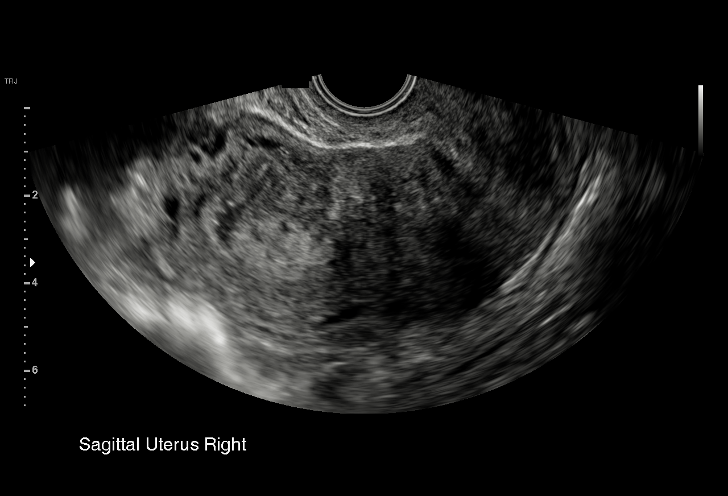
[im 47/67]
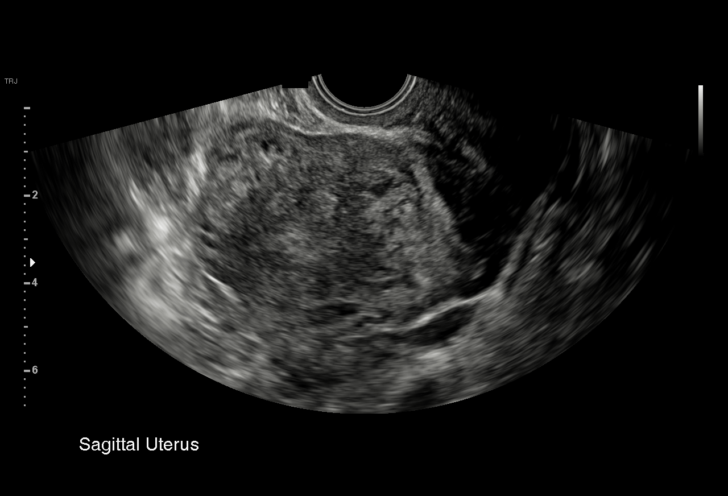
[im 52/67]
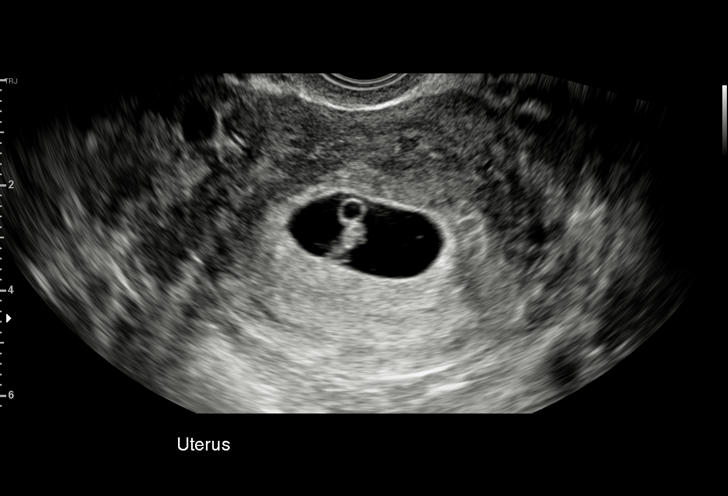
[im 57/67]
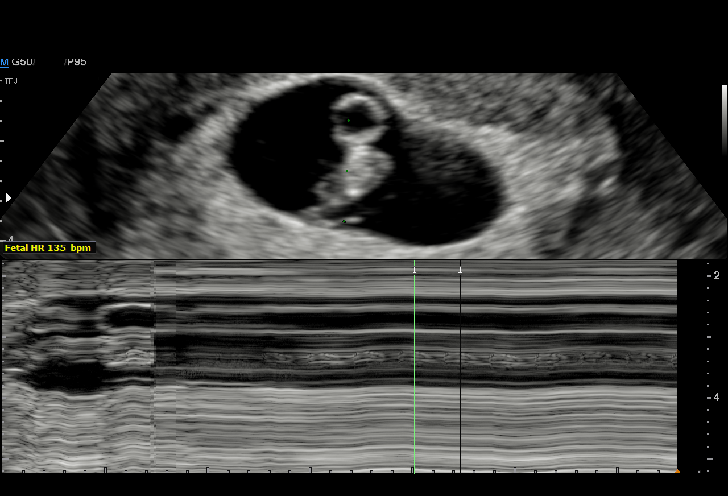
[im 62/67]
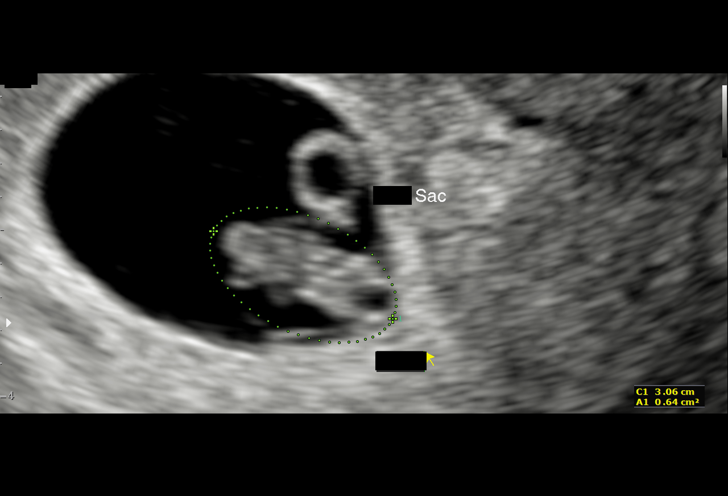
[im 67/67]
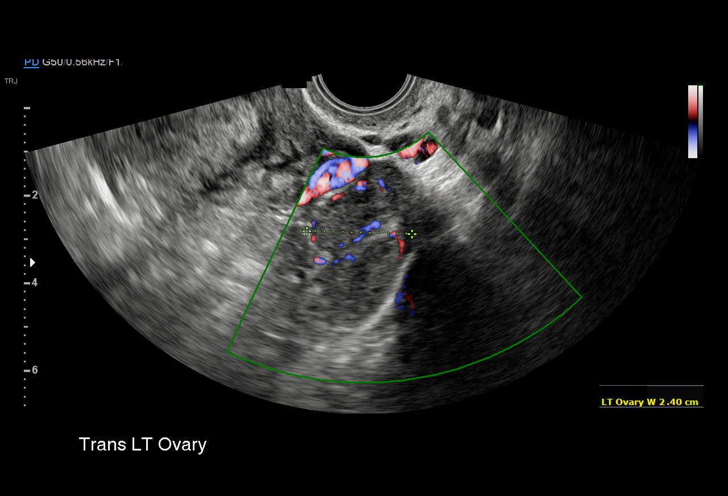

[15 of 28 positions shown; findings below may reference images not displayed]

FINDINGS: Intrauterine gestational sac: Single

Yolk sac:  Visualized.

Embryo:  Visualized.

Cardiac Activity: Visualized.

Heart Rate: 135 bpm

CRL:   11.3 mm   7 w 1 d                  US EDC: 10/04/2019

Subchorionic hemorrhage:  None visualized.

Maternal uterus/adnexae:

Right ovary: Normal

Left ovary: Normal

Other :None

Free fluid:  None
IMPRESSION: 1. Single living intrauterine gestation identified. The estimated
gestational age is 7 weeks and 1 day.

## 2021-01-17 ENCOUNTER — Encounter: Payer: Self-pay | Admitting: Nurse Practitioner

## 2021-01-17 ENCOUNTER — Telehealth (INDEPENDENT_AMBULATORY_CARE_PROVIDER_SITE_OTHER): Payer: Medicaid Other | Admitting: Nurse Practitioner

## 2021-01-17 ENCOUNTER — Other Ambulatory Visit: Payer: Self-pay

## 2021-01-17 DIAGNOSIS — U071 COVID-19: Secondary | ICD-10-CM

## 2021-01-17 NOTE — Progress Notes (Signed)
Subjective:    Patient ID: Deanna [name], female    DOB: November 21, 1995, 25 y.o.   MRN: [id number]  HPI: Deanna [name] is a 25 y.o. female presenting virtually for upper respiratory tract infection.  No chief complaint on file.  UPPER RESPIRATORY TRACT INFECTION Onset: 01/13/2021 COVID testing history: tested positive Wednesday COVID vaccination status: Fever: no  Body aches: better now Cough: yes Shortness of breath: no Wheezing: no Chest pain: no Chest tightness: no Chest congestion: no Nasal congestion: yes Runny nose: yes Post nasal drip: yes Sneezing: yes Sore throat: yes Swollen glands: yes Sinus pressure: yes Headache: yes Face pain: no Toothache: no Ear pain: no  Ear pressure: no  Eyes red/itching:no Eye drainage/crusting: no  Nausea: yes  Vomiting: no Diarrhea: no  Change in appetite: yes ; decreased Loss of taste/smell: no  Rash: no Fatigue: yes Sick contacts: yes Strep contacts: no  Context: better Recurrent sinusitis: no Treatments attempted: Tylenol cold & flu and theraflu  Relief with OTC medications: yes  No Known Allergies  Outpatient Encounter Medications as of 01/17/2021  Medication Sig  . acetaminophen (TYLENOL) 500 MG tablet Take 1,000 mg by mouth every 6 (six) hours as needed for mild pain.  Marland Kitchen azaTHIOprine (IMURAN) 50 MG tablet TAKE 1 TABLET BY MOUTH EVERY DAY  . etonogestrel (NEXPLANON) 68 MG IMPL implant 1 each (68 mg total) by Subdermal route once for 1 dose. December 2020  . Melatonin 10 MG TABS Take 10 tablets by mouth daily as needed (sleep).  . pantoprazole (PROTONIX) 40 MG tablet Take 1 tablet (40 mg total) by mouth in the morning.  . sertraline (ZOLOFT) 100 MG tablet TAKE 1 AND 1/2 TABLETS DAILY BY MOUTH DAILY  . [DISCONTINUED] ondansetron (ZOFRAN ODT) 4 MG disintegrating tablet Take 1 tablet (4 mg total) by mouth every 8 (eight) hours as needed for nausea or vomiting.   No facility-administered encounter medications  on file as of 01/17/2021.    Patient Active Problem List   Diagnosis Date Noted  . Preterm premature rupture of membranes (PPROM) with unknown onset of labor 09/11/2019  . Status post delivery at term 09/11/2019  . Obesity in pregnancy 09/06/2019  . H/O rapid labor 09/06/2019  . IUGR (intrauterine growth restriction) affecting care of mother 06/25/2019  . HGSIL (high grade squamous intraepithelial lesion) on Pap smear of cervix 04/12/2019  . Supervision of high risk pregnancy, antepartum 02/06/2019  . Depression with anxiety 10/27/2018  . Exposure to STD 07/07/2018  . Keloid 09/12/2013  . Acne vulgaris 09/12/2013  . 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[place]) 08/09/2012    Past Medical History:  Diagnosis Date  . Anxiety   . Arthritis, 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[place])   . Crohn disease ([place])    dx in 2012  . Depression   . Hemorrhoid   . Infection    UTI x many    Relevant past medical, surgical, family and social history reviewed and updated as indicated. Interim medical history since our last visit reviewed.  Review of Systems Per HPI unless specifically indicated above     Objective:    There were no vitals taken for this visit.  Wt Readings from Last 3 Encounters:  10/23/20 280 lb (127 kg)  07/26/20 279 lb (126.6 kg)  07/09/20 277 lb (125.6 kg)    Physical Exam Vitals and nursing note reviewed.  Constitutional:      General:  She is not in acute distress.    Appearance: Normal appearance. She is obese. She is not ill-appearing, toxic-appearing or diaphoretic.  HENT:     Head: Normocephalic and atraumatic.     Nose: Rhinorrhea present. No congestion.     Mouth/Throat:     Mouth: Mucous membranes are moist.     Pharynx: Oropharynx is clear.  Eyes:     General: No scleral icterus.       Right eye: No discharge.        Left eye: No discharge.     Extraocular Movements: Extraocular movements  intact.  Cardiovascular:     Comments: Unable to assess heart sounds via virtual visit. Pulmonary:     Effort: Pulmonary effort is normal. No respiratory distress.     Comments: Unable to assess breath sounds via virtual visit.  Patient talking in complete sentences.  No accessory muscle use. Skin:    Coloration: Skin is not jaundiced or pale.     Findings: No erythema.  Neurological:     Mental Status: She is alert and oriented to person, place, and time.  Psychiatric:        Mood and Affect: Mood normal.        Behavior: Behavior normal.        Thought Content: Thought content normal.        Judgment: Judgment normal.     Results for orders placed or performed in visit on 10/23/20  Infliximab+Ab (Serial Monitor)  Result Value Ref Range   Infliximab Drug Level 13 ug/mL   Anti-Infliximab Antibody <22 ng/mL  CBC with Differential/Platelet  Result Value Ref Range   WBC 8.4 4.0 - 10.5 K/uL   RBC 4.65 3.87 - 5.11 Mil/uL   Hemoglobin 13.8 12.0 - 15.0 g/dL   HCT 40.9 36.0 - 46.0 %   MCV 87.9 78.0 - 100.0 fl   MCHC 33.7 30.0 - 36.0 g/dL   RDW 14.4 11.5 - 15.5 %   Platelets 236.0 150.0 - 400.0 K/uL   Neutrophils Relative % 49.5 43.0 - 77.0 %   Lymphocytes Relative 40.7 12.0 - 46.0 %   Monocytes Relative 7.1 3.0 - 12.0 %   Eosinophils Relative 1.9 0.0 - 5.0 %   Basophils Relative 0.8 0.0 - 3.0 %   Neutro Abs 4.2 1.4 - 7.7 K/uL   Lymphs Abs 3.4 0.7 - 4.0 K/uL   Monocytes Absolute 0.6 0.1 - 1.0 K/uL   Eosinophils Absolute 0.2 0.0 - 0.7 K/uL   Basophils Absolute 0.1 0.0 - 0.1 K/uL  Comp Met (CMET)  Result Value Ref Range   Sodium 134 (L) 135 - 145 mEq/L   Potassium 3.8 3.5 - 5.1 mEq/L   Chloride 103 96 - 112 mEq/L   CO2 26 19 - 32 mEq/L   Glucose, Bld 86 70 - 99 mg/dL   BUN 8 6 - 23 mg/dL   Creatinine, Ser 0.68 0.40 - 1.20 mg/dL   Total Bilirubin 0.3 0.2 - 1.2 mg/dL   Alkaline Phosphatase 58 39 - 117 U/L   AST 10 0 - 37 U/L   ALT 7 0 - 35 U/L   Total Protein 7.9 6.0 - 8.3  g/dL   Albumin 4.1 3.5 - 5.2 g/dL   GFR 122.12 >60.00 mL/min   Calcium 9.1 8.4 - 10.5 mg/dL  Serial Monitoring  Result Value Ref Range   Serial Monitoring WILL FOLLOW       Assessment & Plan:  1. COVID-19 Acute.  Day 5 of symptoms.  Symptoms are improving.   Discussed expected course and features suggestive of secondary bacterial infection.  Continue supportive care. Increase fluid intake with water or electrolyte solution like pedialyte. Encouraged acetaminophen as needed for fever/pain. Encouraged salt water gargling, chloraseptic spray and throat lozenges. Encouraged OTC guaifenesin. Encouraged saline sinus flushes and/or neti with humidified air.  Follow up if symptoms not improving over next week.  With any sudden onset new chest pain, dizziness, sweating, or shortness of breath, go to ED.    Follow up plan: Return in about 6 months (around 07/19/2021) for mood f/u.  Due to the catastrophic nature of the COVID-19 pandemic, this video visit was completed soley via audio and visual contact via Caregility due to the restrictions of the COVID-19 pandemic.  All issues as above were discussed and addressed. Physical exam was done as above through visual confirmation on Caregility. If it was felt that the patient should be evaluated in the office, they were directed there. The patient verbally consented to this visit. . Location of the patient: home . Location of the provider: work . Those involved with this call:  . Provider: Noemi Chapel, DNP, FNP-C . CMA: n/a . Front Desk/Registration: Santina Evans  . Time spent on call: 15 minutes with patient face to face via video conference. More than 50% of this time was spent in counseling and coordination of care. 15 minutes total spent in review of patient's record and preparation of their chart.  I verified patient identity using two factors (patient name and date of birth). Patient consents verbally to being seen via telemedicine visit  today.

## 2021-01-18 NOTE — Progress Notes (Deleted)
Office Visit Note  Patient: Deanna [name]             Date of Birth: 08-20-1996           MRN: [id number]             PCP: Eulogio Bear, NP Referring: [name], MD Visit Date: 01/21/2021 Occupation: @GUAROCC @  Subjective:  No chief complaint on file.   History of Present Illness: Deanna [name] is a 25 y.o. female ***   Activities of Daily Living:  Patient reports morning stiffness for *** {minute/hour:19697}.   Patient {ACTIONS;DENIES/REPORTS:21021675::"Denies"} nocturnal pain.  Difficulty dressing/grooming: {ACTIONS;DENIES/REPORTS:21021675::"Denies"} Difficulty climbing stairs: {ACTIONS;DENIES/REPORTS:21021675::"Denies"} Difficulty getting out of chair: {ACTIONS;DENIES/REPORTS:21021675::"Denies"} Difficulty using hands for taps, buttons, cutlery, and/or writing: {ACTIONS;DENIES/REPORTS:21021675::"Denies"}  No Rheumatology ROS completed.   PMFS History:  Patient Active Problem List   Diagnosis Date Noted  . Preterm premature rupture of membranes (PPROM) with unknown onset of labor 09/11/2019  . Status post delivery at term 09/11/2019  . Obesity in pregnancy 09/06/2019  . H/O rapid labor 09/06/2019  . IUGR (intrauterine growth restriction) affecting care of mother 06/25/2019  . HGSIL (high grade squamous intraepithelial lesion) on Pap smear of cervix 04/12/2019  . Supervision of high risk pregnancy, antepartum 02/06/2019  . Depression with anxiety 10/27/2018  . Exposure to STD 07/07/2018  . Keloid 09/12/2013  . Acne vulgaris 09/12/2013  . 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[place]) 08/09/2012    Past Medical History:  Diagnosis Date  . Anxiety   . Arthritis, 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[place])   . Crohn disease ([place])    dx in 2012  . Depression   . Hemorrhoid   . Infection    UTI x many    Family History  Problem Relation Age of Onset  . Diabetes Maternal Grandmother    . Gallstones Mother   . Anxiety disorder Mother   . Depression Mother   . Schizophrenia Father   . Eczema Father   . Stomach cancer Maternal Aunt   . Colon cancer Maternal Aunt   . Diabetes Maternal Grandfather   . Heart disease Maternal Grandfather   . Stroke Maternal Grandfather   . Colon cancer Maternal Grandfather   . Healthy Son   . Healthy Son   . Colon polyps Neg Hx   . Esophageal cancer Neg Hx   . Rectal cancer Neg Hx    Past Surgical History:  Procedure Laterality Date  . COLONOSCOPY    . COLONOSCOPY  2014   [facility]  . TONSILLECTOMY     Social History   Social History Narrative  . Not on file   Immunization History  Administered Date(s) Administered  . DTaP 10/25/1996, 12/25/1996, 02/23/1997, 01/22/1998, 09/30/2001  . HPV 9-valent 07/17/2015  . HPV Quadrivalent 09/12/2013  . Hepatitis A, Ped/Adol-2 Dose 09/12/2013, 07/17/2015  . Hepatitis B May 30, 1996, 10/25/1996, 05/25/1997  . HiB (PRP-OMP) 10/25/1996, 12/25/1996, 02/23/1997, 01/22/1998  . IPV 10/25/1996, 12/25/1996, 08/27/1997, 09/30/2001  . Influenza,inj,Quad PF,6+ Mos 09/12/2013, 07/17/2015, 06/11/2016, 07/12/2019  . MMR 08/27/1997, 09/30/2001  . Meningococcal Conjugate 09/12/2013  . PFIZER(Purple Top)SARS-COV-2 Vaccination 12/30/2019, 01/23/2020  . PPD Test 06/22/2014  . Tdap 04/02/2008, 09/01/2016, 07/12/2019  . Varicella 08/27/1997, 04/02/2008     Objective: Vital Signs: There were no vitals taken for this visit.   Physical Exam   Musculoskeletal Exam: ***  CDAI Exam: CDAI Score: -- Patient Global: --; Provider Global: -- Swollen: --;  Tender: -- Joint Exam 01/21/2021   No joint exam has been documented for this visit   There is currently no information documented on the homunculus. Go to the Rheumatology activity and complete the homunculus joint exam.  Investigation: No additional findings.  Imaging: No results found.  Recent Labs: Lab Results  Component Value Date   WBC 8.4  10/23/2020   HGB 13.8 10/23/2020   PLT 236.0 10/23/2020   NA 134 (L) 10/23/2020   K 3.8 10/23/2020   CL 103 10/23/2020   CO2 26 10/23/2020   GLUCOSE 86 10/23/2020   BUN 8 10/23/2020   CREATININE 0.68 10/23/2020   BILITOT 0.3 10/23/2020   ALKPHOS 58 10/23/2020   AST 10 10/23/2020   ALT 7 10/23/2020   PROT 7.9 10/23/2020   ALBUMIN 4.1 10/23/2020   CALCIUM 9.1 10/23/2020   GFRAA 114 10/20/2019   QFTBGOLDPLUS NEGATIVE 10/20/2019      Speciality Comments: No specialty comments available.  Procedures:  No procedures performed Allergies: Patient has no known allergies.   Assessment / Plan:     Visit Diagnoses: No diagnosis found.  Orders: No orders of the defined types were placed in this encounter.  No orders of the defined types were placed in this encounter.   Face-to-face time spent with patient was *** minutes. Greater than 50% of time was spent in counseling and coordination of care.  Follow-Up Instructions: No follow-ups on file.   Bo Merino, MD  Note - This record has been created using Editor, commissioning.  Chart creation errors have been sought, but may not always  have been located. Such creation errors do not reflect on  the standard of medical care.

## 2021-01-21 ENCOUNTER — Ambulatory Visit: Payer: Medicaid Other | Admitting: Rheumatology

## 2021-01-21 DIAGNOSIS — M24521 Contracture, right elbow: Secondary | ICD-10-CM

## 2021-01-21 DIAGNOSIS — F418 Other specified anxiety disorders: Secondary | ICD-10-CM

## 2021-01-21 DIAGNOSIS — R87613 High grade squamous intraepithelial lesion on cytologic smear of cervix (HGSIL): Secondary | ICD-10-CM

## 2021-01-21 DIAGNOSIS — M0880 Other juvenile arthritis, unspecified site: Secondary | ICD-10-CM

## 2021-01-21 DIAGNOSIS — Z840 Family history of diseases of the skin and subcutaneous tissue: Secondary | ICD-10-CM

## 2021-01-21 DIAGNOSIS — L700 Acne vulgaris: Secondary | ICD-10-CM

## 2021-01-21 DIAGNOSIS — K50813 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: Secondary | ICD-10-CM

## 2021-01-21 DIAGNOSIS — G8929 Other chronic pain: Secondary | ICD-10-CM

## 2021-01-21 DIAGNOSIS — R5383 Other fatigue: Secondary | ICD-10-CM

## 2021-01-21 DIAGNOSIS — M546 Pain in thoracic spine: Secondary | ICD-10-CM

## 2021-01-21 DIAGNOSIS — Z79899 Other long term (current) drug therapy: Secondary | ICD-10-CM

## 2021-01-21 DIAGNOSIS — M7918 Myalgia, other site: Secondary | ICD-10-CM

## 2021-01-22 NOTE — Progress Notes (Signed)
Left message on patient's voicemail to request call back for scheduling. Thank you!

## 2021-01-24 ENCOUNTER — Other Ambulatory Visit: Payer: Self-pay | Admitting: Family Medicine

## 2021-01-29 ENCOUNTER — Encounter: Payer: Self-pay | Admitting: Nurse Practitioner

## 2021-01-29 ENCOUNTER — Other Ambulatory Visit: Payer: Self-pay

## 2021-01-29 ENCOUNTER — Ambulatory Visit: Payer: Medicaid Other | Admitting: Nurse Practitioner

## 2021-01-29 VITALS — BP 124/80 | HR 111 | Temp 97.2°F | Ht 68.0 in | Wt 273.2 lb

## 2021-01-29 DIAGNOSIS — B9689 Other specified bacterial agents as the cause of diseases classified elsewhere: Secondary | ICD-10-CM

## 2021-01-29 DIAGNOSIS — R059 Cough, unspecified: Secondary | ICD-10-CM

## 2021-01-29 DIAGNOSIS — J019 Acute sinusitis, unspecified: Secondary | ICD-10-CM

## 2021-01-29 DIAGNOSIS — U099 Post covid-19 condition, unspecified: Secondary | ICD-10-CM | POA: Insufficient documentation

## 2021-01-29 MED ORDER — ALBUTEROL SULFATE HFA 108 (90 BASE) MCG/ACT IN AERS: 2.0000 | INHALATION_SPRAY | RESPIRATORY_TRACT | 0 refills | Status: DC | PRN

## 2021-01-29 MED ORDER — AMOXICILLIN-POT CLAVULANATE 875-125 MG PO TABS: 1.0000 | ORAL_TABLET | Freq: Two times a day (BID) | ORAL | 0 refills | Status: AC

## 2021-01-29 NOTE — Progress Notes (Signed)
Subjective:    Patient ID: Deanna [name], female    DOB: 10-24-1995, 25 y.o.   MRN: [id number]  HPI: Deanna [name] is a 25 y.o. female presenting for ongoing COVID-19 symptoms.   Chief Complaint  Patient presents with  . Sore Throat    Feels like thought is swollen, hard to swallow, cough, sob. Covid + 4/28   UPPER RESPIRATORY TRACT INFECTION Onset: 01/12/2021 COVID-19 positive test: 01/15/2021 After our last virtual visit, her symptoms worsened.  Fever: no Cough: yes; productive - able to cough up mucus Shortness of breath: yes Wheezing: yes Chest pain: no Chest tightness: no Chest congestion: no Nasal congestion: yes Runny nose: yes Post nasal drip: yes Sneezing: no Sore throat: yes Swollen glands: yes Sinus pressure: yes; in cheeks Headache: no Face pain: yes Toothache: yes Ear pain: no  Ear pressure: no  Eyes red/itching:yes Eye drainage/crusting: yes  Nausea: no  Vomiting: no Diarrhea: no  Change in appetite: yes; decreased  Loss of taste/smell: no  Rash: no Fatigue: yes Sick contacts: no Strep contacts: no  Context: fluctuating Recurrent sinusitis: no Treatments attempted: Tylenol severe sinus, Mucinex, warm tea, elderberry syrup Relief with OTC medications: yes  No Known Allergies  Outpatient Encounter Medications as of 01/29/2021  Medication Sig  . acetaminophen (TYLENOL) 500 MG tablet Take 1,000 mg by mouth every 6 (six) hours as needed for mild pain.  Marland Kitchen amoxicillin-clavulanate (AUGMENTIN) 875-125 MG tablet Take 1 tablet by mouth 2 (two) times daily for 7 days.  Marland Kitchen azaTHIOprine (IMURAN) 50 MG tablet TAKE 1 TABLET BY MOUTH EVERY DAY  . etonogestrel (NEXPLANON) 68 MG IMPL implant 1 each (68 mg total) by Subdermal route once for 1 dose. December 2020  . Melatonin 10 MG TABS Take 10 tablets by mouth daily as needed (sleep).  . pantoprazole (PROTONIX) 40 MG tablet Take 1 tablet (40 mg total) by mouth in the morning.  . sertraline (ZOLOFT) 100  MG tablet TAKE 1 AND 1/2 TABLETS DAILY BY MOUTH DAILY  . albuterol (VENTOLIN HFA) 108 (90 Base) MCG/ACT inhaler Inhale 2 puffs into the lungs every 4 (four) hours as needed for wheezing or shortness of breath.   No facility-administered encounter medications on file as of 01/29/2021.    Patient Active Problem List   Diagnosis Date Noted  . Long COVID 01/29/2021  . Preterm premature rupture of membranes (PPROM) with unknown onset of labor 09/11/2019  . Status post delivery at term 09/11/2019  . Obesity in pregnancy 09/06/2019  . H/O rapid labor 09/06/2019  . IUGR (intrauterine growth restriction) affecting care of mother 06/25/2019  . HGSIL (high grade squamous intraepithelial lesion) on Pap smear of cervix 04/12/2019  . Supervision of high risk pregnancy, antepartum 02/06/2019  . Depression with anxiety 10/27/2018  . Exposure to STD 07/07/2018  . Keloid 09/12/2013  . Acne vulgaris 09/12/2013  . 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[place]) 09/30/2012  . 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 Anxiety   . 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 Crohn disease ([place])    dx in 2012  . Depression   . Hemorrhoid   . Infection    UTI x many    Relevant past medical, surgical, family and social history reviewed and updated as indicated. Interim medical history since our last visit reviewed.  Review of Systems Per HPI unless specifically indicated above     Objective:  BP 124/80   Pulse (!) 111   Temp (!) 97.2 F (36.2 C)   Ht 5' 8"  (1.727 m)   Wt 273 lb 3.2 oz (123.9 kg)   SpO2 97%   BMI 41.54 kg/m   Wt Readings from Last 3 Encounters:  01/29/21 273 lb 3.2 oz (123.9 kg)  10/23/20 280 lb (127 kg)  07/26/20 279 lb (126.6 kg)    Physical Exam Vitals and nursing note reviewed.  Constitutional:      General: She is not in acute distress.    Appearance: She is well-developed. She is obese. She  is not toxic-appearing.  HENT:     Head: Normocephalic and atraumatic.     Right Ear: Tympanic membrane and ear canal normal. No middle ear effusion. Tympanic membrane is not erythematous.     Left Ear: Ear canal normal.  No middle ear effusion. Tympanic membrane is not erythematous.     Nose: Congestion present. No rhinorrhea.     Right Sinus: Maxillary sinus tenderness present. No frontal sinus tenderness.     Left Sinus: Maxillary sinus tenderness present. No frontal sinus tenderness.     Mouth/Throat:     Mouth: Mucous membranes are moist. No oral lesions.     Pharynx: Oropharynx is clear. No uvula swelling.     Tonsils: No tonsillar exudate.  Eyes:     Extraocular Movements:     Right eye: Normal extraocular motion.     Left eye: Normal extraocular motion.  Cardiovascular:     Rate and Rhythm: Regular rhythm. Tachycardia present.     Heart sounds: Normal heart sounds. No murmur heard.   Pulmonary:     Effort: Pulmonary effort is normal. No respiratory distress.     Breath sounds: Normal breath sounds. No wheezing, rhonchi or rales.     Comments: Decreased air movement prior to albuterol nebulizer; improved after nebulizer Abdominal:     General: Bowel sounds are normal.     Palpations: Abdomen is soft.  Musculoskeletal:     Cervical back: Normal range of motion and neck supple.  Lymphadenopathy:     Cervical: Cervical adenopathy present.  Skin:    General: Skin is warm and dry.     Capillary Refill: Capillary refill takes less than 2 seconds.     Coloration: Skin is not pale.     Findings: No erythema or rash.  Neurological:     Mental Status: She is alert and oriented to person, place, and time.  Psychiatric:        Mood and Affect: Mood normal.        Behavior: Behavior normal.       Assessment & Plan:  1. Acute bacterial sinusitis Acute.  Given significant maxillary tenderness to palpation and symptoms that were improving then worsened, likely bacterial.  Will  start Augmentin twice daily for 7 days.  Follow up if symptoms do not improve.  - amoxicillin-clavulanate (AUGMENTIN) 875-125 MG tablet; Take 1 tablet by mouth 2 (two) times daily for 7 days.  Dispense: 14 tablet; Refill: 0  2. Long COVID Start albuterol inhaler to help with shortness of breath, wheezing, and tightness.  Follow up if using more than 1-2 times per day after about 1 week.  3. Cough Acute.  Lung sounds clear today; likely related to either post nasal drop from sinus infection, Long COVID, or asthma exacerbation.  Praised for smoking cessation.  Will start albuterol inhaler and follow up if using more than 1-2 times  weekly.  - albuterol (VENTOLIN HFA) 108 (90 Base) MCG/ACT inhaler; Inhale 2 puffs into the lungs every 4 (four) hours as needed for wheezing or shortness of breath.  Dispense: 1 each; Refill: 0    Follow up plan: Return if symptoms worsen or fail to improve.

## 2021-01-30 ENCOUNTER — Telehealth: Payer: Self-pay | Admitting: Gastroenterology

## 2021-01-30 NOTE — Telephone Encounter (Signed)
Left message for Deanna [name] to call back.  [facility] pt with Crohn's, Pt was diagnosed with covid on 01/16/21. She saw her PCP for some URI issues and was given Amoxicillin for a sinus infection and just started taking it yesterday. [facility] calling wanting to know if it is ok for pt to receive Remicade today. As DOD please advise.

## 2021-01-30 NOTE — Telephone Encounter (Signed)
Bri from [facility] called to ask if Dr. Tarri Glenn would still want the patient to have her Remacade infusion since she is on Amoxicillin please advise [phone] Ext [phone]

## 2021-01-30 NOTE — Telephone Encounter (Signed)
DOD  Active sinus infection, just starting amoxicillin.  Lets delay Remicade until early next week. RG

## 2021-01-30 NOTE — Telephone Encounter (Signed)
Spoke with Bri at [facility] and she is aware and will reschedule pt for next week after she has finished the antibiotic.

## 2021-02-21 ENCOUNTER — Encounter: Payer: Medicaid Other | Admitting: Nurse Practitioner

## 2021-02-24 ENCOUNTER — Encounter: Payer: Medicaid Other | Admitting: Nurse Practitioner

## 2021-03-25 ENCOUNTER — Telehealth: Payer: Self-pay

## 2021-03-25 NOTE — Telephone Encounter (Signed)
Thank you :)

## 2021-03-25 NOTE — Telephone Encounter (Signed)
Pt at [facility] for Remicade. Last tx was delayed due to antibiotic for sinus infection and pt was rescheduled but did not come for that appt. She is at [facility] today and Lennice Sites NP saw the pt. She reports pt has RA and Crohns. She has sinus drainage, vomiting, nausea, loose stools and a painful swollen cervical lymph node. NP gave pt some tylenol and her discomfort is better but she wants to know if Dr. Tarri Glenn ok proceeding with Remicade. Please advise

## 2021-03-25 NOTE — Telephone Encounter (Signed)
Per Dr. Tarri Glenn:  Thanks for the update. I would recommend being cautious. Have her follow-up with PCP and reschedule Remicade for next week. Thanks.  Spoke with Lennice Sites NP and she is aware and will let pt know.

## 2021-04-02 ENCOUNTER — Telehealth (INDEPENDENT_AMBULATORY_CARE_PROVIDER_SITE_OTHER): Payer: Medicaid Other | Admitting: Nurse Practitioner

## 2021-04-02 ENCOUNTER — Ambulatory Visit: Payer: Medicaid Other | Admitting: Nurse Practitioner

## 2021-04-02 ENCOUNTER — Other Ambulatory Visit: Payer: Self-pay

## 2021-04-02 ENCOUNTER — Encounter: Payer: Self-pay | Admitting: Nurse Practitioner

## 2021-04-02 VITALS — BP 160/100 | Ht 68.0 in | Wt 268.8 lb

## 2021-04-02 DIAGNOSIS — F418 Other specified anxiety disorders: Secondary | ICD-10-CM

## 2021-04-02 DIAGNOSIS — Z8709 Personal history of other diseases of the respiratory system: Secondary | ICD-10-CM | POA: Diagnosis not present

## 2021-04-02 LAB — CBC WITH DIFFERENTIAL/PLATELET
Absolute Monocytes: 783 cells/uL (ref 200–950)
Basophils Absolute: 62 cells/uL (ref 0–200)
Basophils Relative: 0.7 %
Eosinophils Absolute: 502 cells/uL — ABNORMAL HIGH (ref 15–500)
Eosinophils Relative: 5.7 %
HCT: 42.3 % (ref 35.0–45.0)
Hemoglobin: 13.7 g/dL (ref 11.7–15.5)
Lymphs Abs: 2904 cells/uL (ref 850–3900)
MCH: 29 pg (ref 27.0–33.0)
MCHC: 32.4 g/dL (ref 32.0–36.0)
MCV: 89.6 fL (ref 80.0–100.0)
MPV: 11.2 fL (ref 7.5–12.5)
Monocytes Relative: 8.9 %
Neutro Abs: 4550 cells/uL (ref 1500–7800)
Neutrophils Relative %: 51.7 %
Platelets: 270 10*3/uL (ref 140–400)
RBC: 4.72 10*6/uL (ref 3.80–5.10)
RDW: 13.5 % (ref 11.0–15.0)
Total Lymphocyte: 33 %
WBC: 8.8 10*3/uL (ref 3.8–10.8)

## 2021-04-02 NOTE — Progress Notes (Signed)
Subjective:    Patient ID: Deanna [name], female    DOB: [birth year], 25 y.o.   MRN: [id number]  HPI: Deanna [name] is a 25 y.o. female presenting with grandmother for follow up.  Chief Complaint  Patient presents with   Follow-up    UPPER RESPIRATORY TRACT INFECTION Patient reports she is here to be rechecked after her recent upper respiratory infection to ensure is okay that she get Remicade.  She reports her symptoms are improved all the way. Onset: Mid May Fever: no Cough: no Shortness of breath: no Wheezing: no Chest pain: no Chest tightness: no Chest congestion: no Nasal congestion: no Runny nose: no Post nasal drip: no Sneezing: no Sore throat: no Swollen glands: yes; chronic per patient Sinus pressure: no Headache: no Face pain: no Toothache: yes needs tooth work done Ear pain: no  Ear pressure: no  Eyes red/itching:no Eye drainage/crusting: no  Nausea: no  Vomiting: no Diarrhea: no  Change in appetite: no  Loss of taste/smell: no  Rash: no Fatigue: no Sick contacts: no Strep contacts: no  Context: better Recurrent sinusitis: no  She also reports that she feels like the Zoloft is not working as well as it used to.  She is wondering if this can be increased today.  She is not having any thoughts of hurting herself or killing herself.  She is feeling slightly anxious today.   No Known Allergies  Outpatient Encounter Medications as of 04/02/2021  Medication Sig   acetaminophen (TYLENOL) 500 MG tablet Take 1,000 mg by mouth every 6 (six) hours as needed for mild pain.   albuterol (VENTOLIN HFA) 108 (90 Base) MCG/ACT inhaler Inhale 2 puffs into the lungs every 4 (four) hours as needed for wheezing or shortness of breath.   azaTHIOprine (IMURAN) 50 MG tablet TAKE 1 TABLET BY MOUTH EVERY DAY   etonogestrel (NEXPLANON) 68 MG IMPL implant 1 each (68 mg total) by Subdermal route once for 1 dose. December 2020   Melatonin 10 MG TABS Take 10 tablets  by mouth daily as needed (sleep).   pantoprazole (PROTONIX) 40 MG tablet Take 1 tablet (40 mg total) by mouth in the morning.   sertraline (ZOLOFT) 100 MG tablet TAKE 1 AND 1/2 TABLETS DAILY BY MOUTH DAILY   No facility-administered encounter medications on file as of 04/02/2021.    Patient Active Problem List   Diagnosis Date Noted   Long COVID 01/29/2021   Preterm premature rupture of membranes (PPROM) with unknown onset of labor 09/11/2019   Status post delivery at term 09/11/2019   Obesity in pregnancy 09/06/2019   H/O rapid labor 09/06/2019   IUGR (intrauterine growth restriction) affecting care of mother 06/25/2019   HGSIL (high grade squamous intraepithelial lesion) on Pap smear of cervix 04/12/2019   Supervision of high risk pregnancy, antepartum 02/06/2019   Depression with anxiety 10/27/2018   Exposure to STD 07/07/2018   Keloid 09/12/2013   Acne vulgaris 09/12/2013   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[place]) 09/30/2012   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 Interim medical history since our last  visit reviewed.  Review of Systems Per HPI unless specifically indicated above     Objective:    BP (!) 160/100   Ht 5' 8"  (1.727 m)   Wt 268 lb 12.8 oz (121.9 kg)   SpO2 97%   BMI 40.87 kg/m   Wt Readings from Last 3 Encounters:  04/02/21 268 lb 12.8 oz (121.9 kg)  04/02/21 268 lb 9.6 oz (121.8 kg)  01/29/21 273 lb 3.2 oz (123.9 kg)    Physical Exam Vitals and nursing note reviewed.  Constitutional:      General: She is not in acute distress.    Appearance: Normal appearance. She is obese. She is not toxic-appearing.  HENT:     Head:  Normocephalic and atraumatic.     Right Ear: Tympanic membrane, ear canal and external ear normal.     Left Ear: Tympanic membrane, ear canal and external ear normal.     Nose: Nose normal. No congestion.     Mouth/Throat:     Mouth: Mucous membranes are moist.     Pharynx: Oropharynx is clear. No oropharyngeal exudate.  Eyes:     General: No scleral icterus.    Extraocular Movements: Extraocular movements intact.     Pupils: Pupils are equal, round, and reactive to light.  Cardiovascular:     Rate and Rhythm: Normal rate and regular rhythm.     Heart sounds: Normal heart sounds. No murmur heard. Pulmonary:     Effort: Pulmonary effort is normal. No respiratory distress.     Breath sounds: Normal breath sounds. No wheezing, rhonchi or rales.  Abdominal:     General: Abdomen is flat. Bowel sounds are normal.     Palpations: Abdomen is soft.  Musculoskeletal:     Cervical back: Normal range of motion and neck supple.     Right lower leg: No edema.     Left lower leg: No edema.  Skin:    General: Skin is warm and dry.     Capillary Refill: Capillary refill takes less than 2 seconds.     Coloration: Skin is not jaundiced or pale.     Findings: No erythema.  Neurological:     Mental Status: She is alert and oriented to person, place, and time.     Motor: No weakness.     Gait: Gait normal.  Psychiatric:        Mood and Affect: Mood normal.        Behavior: Behavior normal.        Thought Content: Thought content normal.        Judgment: Judgment normal.      Assessment & Plan:   Problem List Items Addressed This Visit       Other   Depression with anxiety - Primary    Chronic.  We will plan to increase Zoloft to 200 mg daily.  No SI/HI today.  Plan to follow-up in 4 to 6 weeks to see how this is going.       Relevant Orders   CBC with Differential (Completed)   Other Visit Diagnoses     History of sinusitis, bacterial       It appears her bacterial sinusitis has  completely resolved.  We will check CBC today to ensure no acute infection.  She should be in good shape to get Remicade.         Follow up plan: No follow-ups on file.

## 2021-04-02 NOTE — Progress Notes (Signed)
Visit transitioned into in-person visit.

## 2021-04-04 MED ORDER — SERTRALINE HCL 100 MG PO TABS: 200.0000 mg | ORAL_TABLET | Freq: Every day | ORAL | 1 refills | Status: DC

## 2021-04-04 NOTE — Assessment & Plan Note (Signed)
Chronic.  We will plan to increase Zoloft to 200 mg daily.  No SI/HI today.  Plan to follow-up in 4 to 6 weeks to see how this is going.

## 2021-05-25 IMAGING — US US MFM UA CORD DOPPLER
1 series · 15 of 28 positions shown · non-contrast
Comparison: none

[Series 1: us mfm ua cord doppler · 29 acquisitions, 15 frames shown]
[im 1/29]
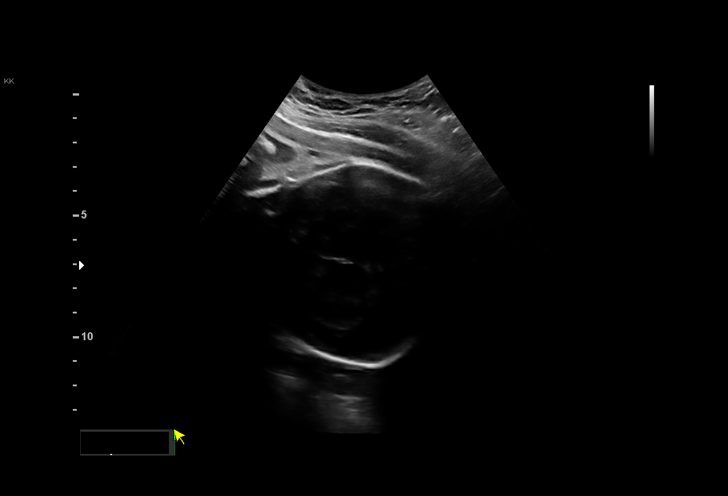
[im 3/29]
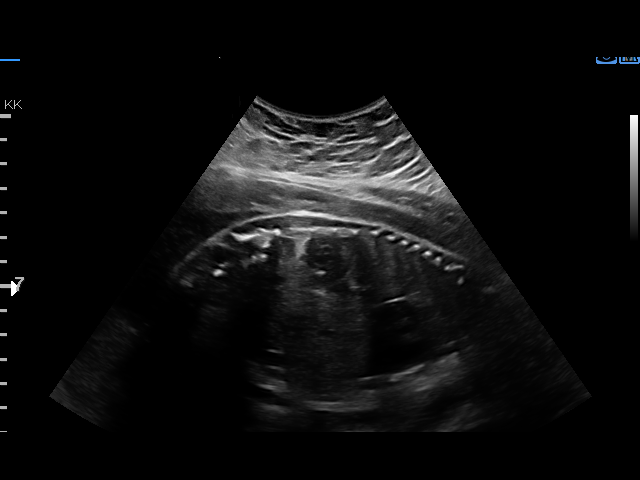
[im 5/29]
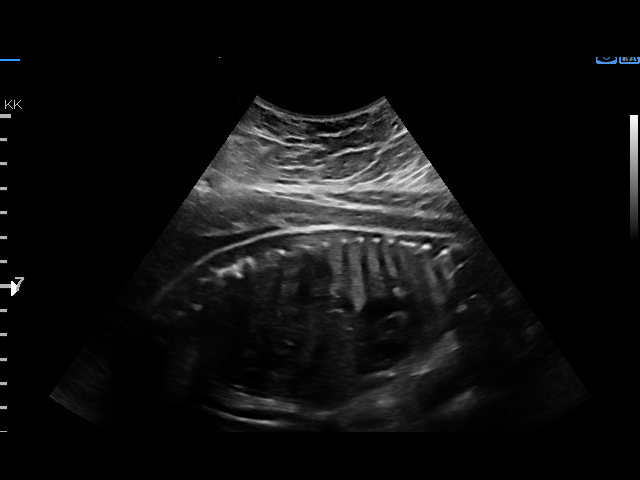
[im 7/29]
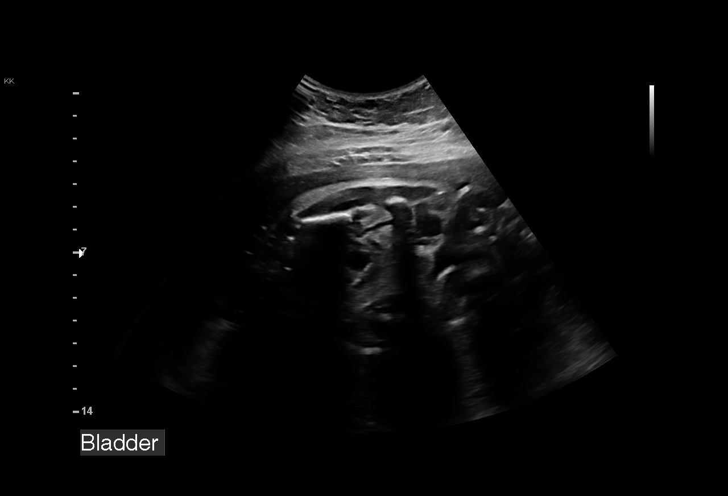
[im 9/29]
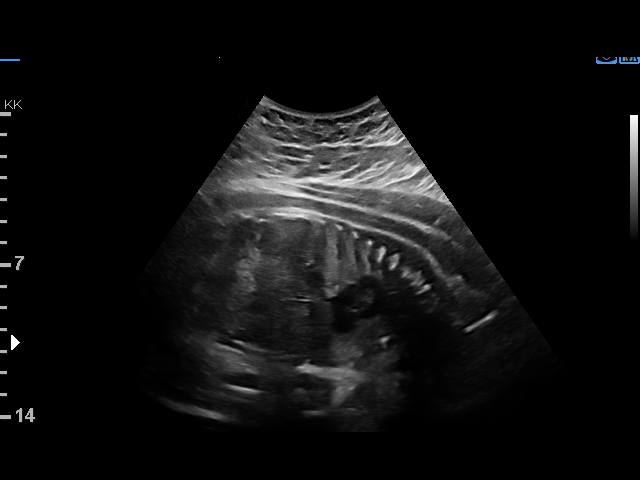
[im 11/29]
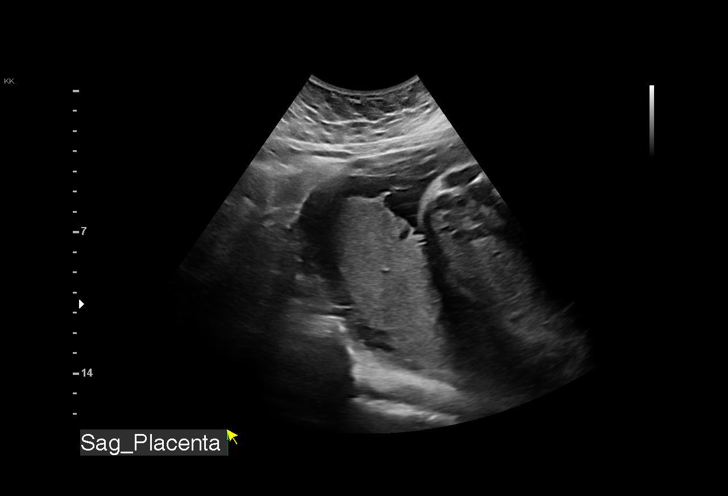
[im 13/29]
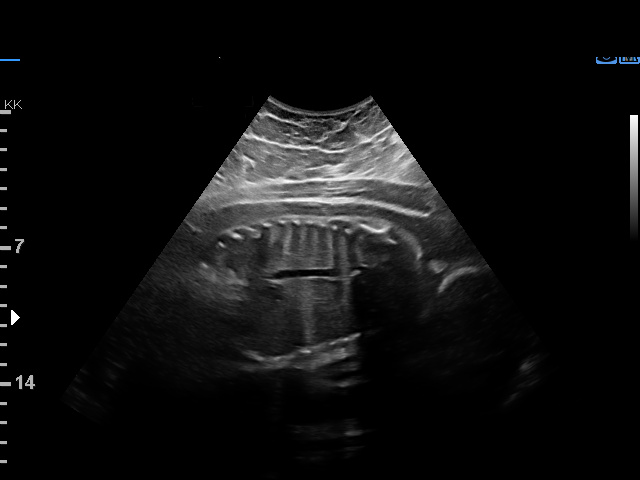
[im 15/29]
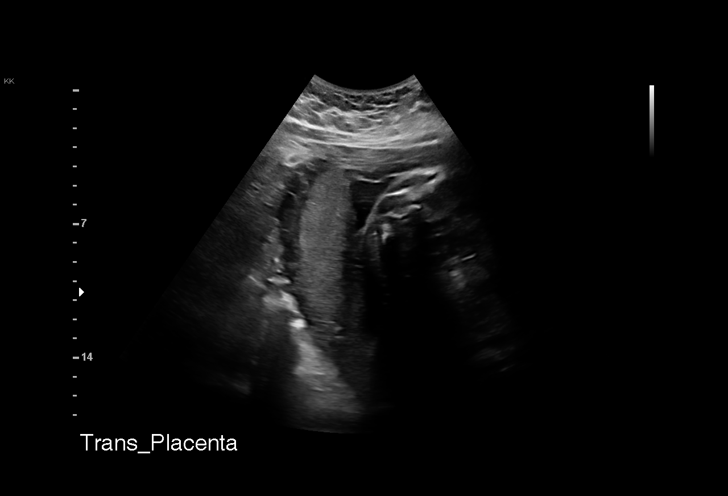
[im 16/29]
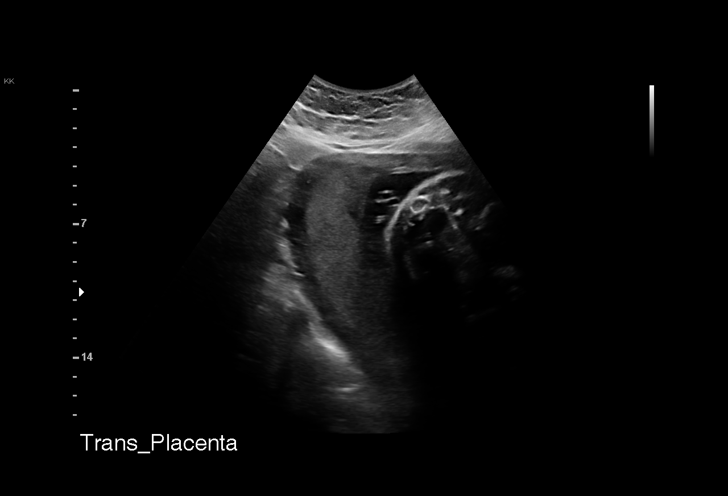
[im 18/29]
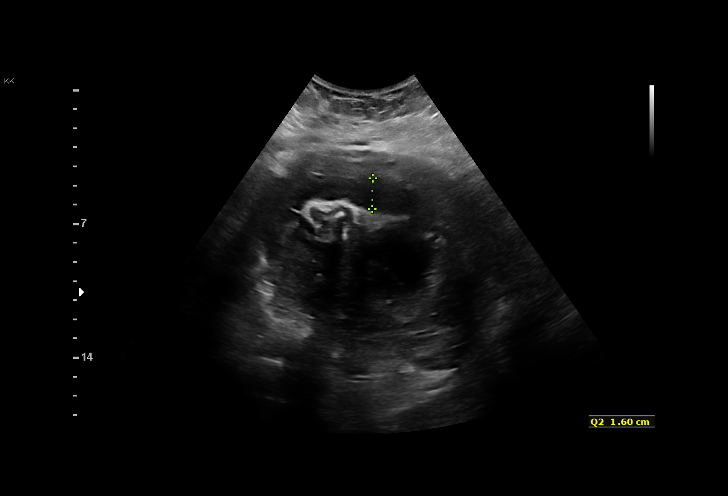
[im 20/29]
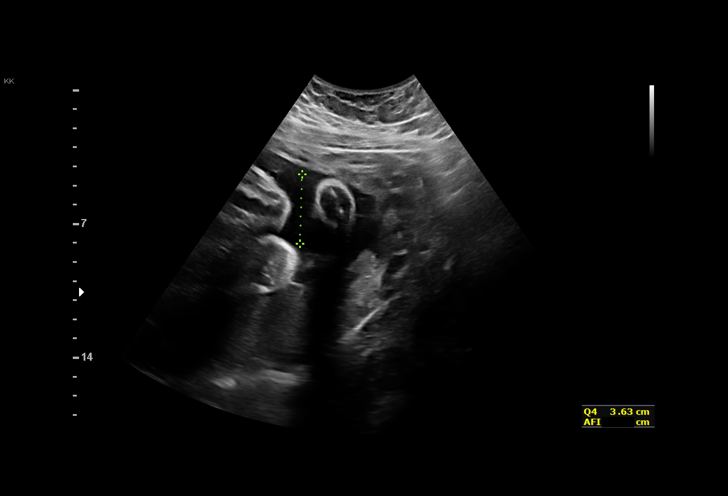
[im 22/29]
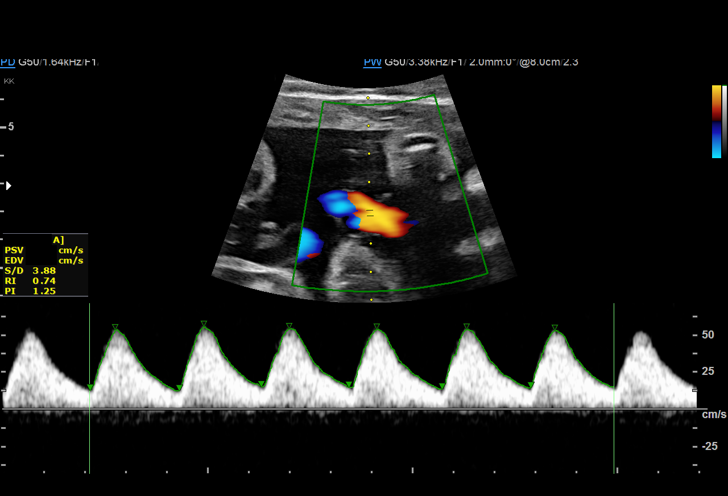
[im 24/29]
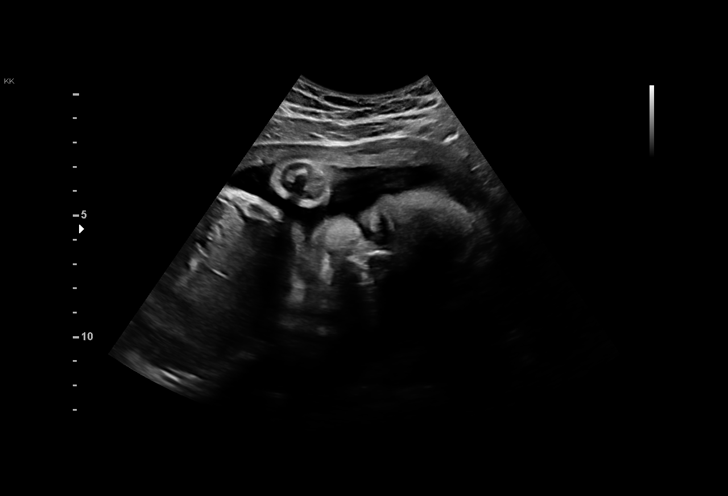
[im 26/29]
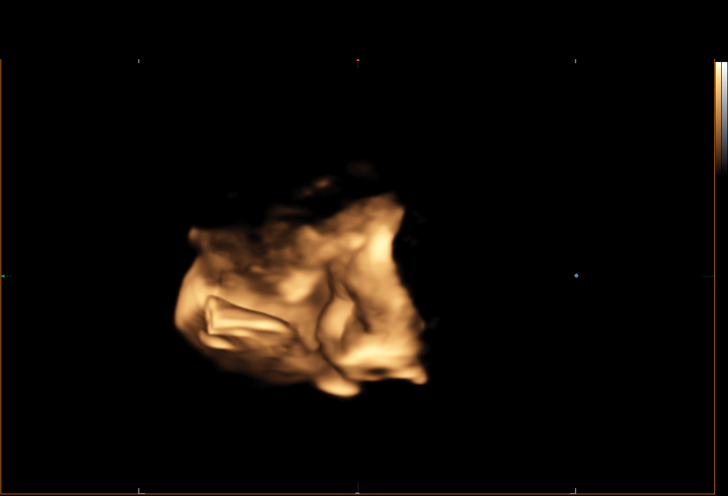
[im 29/29]
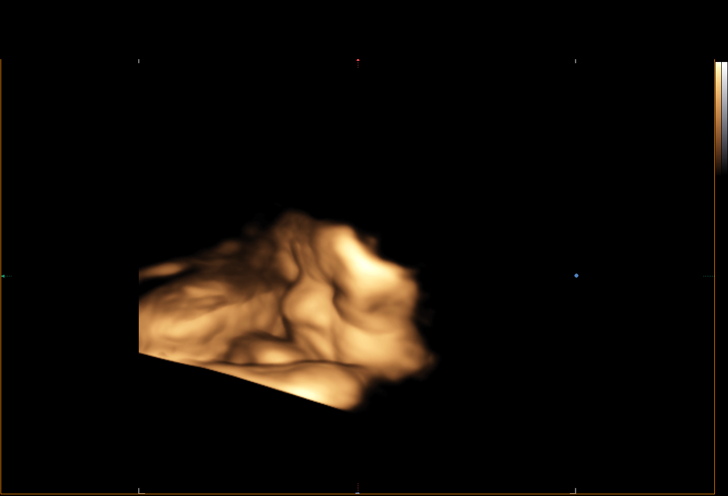

[15 of 28 positions shown; findings below may reference images not displayed]

[address]

 ----------------------------------------------------------------------

 ----------------------------------------------------------------------
Indications

  Maternal care for known or suspected poor
  fetal growth, third trimester, not applicable or
  unspecified IUGR
  Maternal Crohn's disease affecting
  pregnancy in third trimester
  Obesity complicating pregnancy, third
  trimester
  Low Risk NIPS (Negative AFP)
  32 weeks gestation of pregnancy
 ----------------------------------------------------------------------
Fetal Evaluation

 Num Of Fetuses:          1
 Fetal Heart Rate(bpm):   127
 Cardiac Activity:        Observed
 Presentation:            Cephalic
 Placenta:                Posterior
 P. Cord Insertion:       Previously Visualized

 Amniotic Fluid
 AFI FV:      Within normal limits

 AFI Sum(cm)     %Tile       Largest Pocket(cm)
 11.68           30

 RUQ(cm)       RLQ(cm)       LUQ(cm)        LLQ(cm)

Biophysical Evaluation

 Amniotic F.V:   Pocket => 2 cm             F. Tone:         Observed
 F. Movement:    Observed                   Score:           [DATE]
 F. Breathing:   Observed
OB History

 Gravidity:    3         Term:   1        Prem:   0        SAB:   1
 TOP:          0       Ectopic:  0        Living: 1
Gestational Age

 LMP:           36w 1d        Date:  12/05/18                 EDD:   09/11/19
 Best:          32w 6d     Det. By:  Early Ultrasound         EDD:   10/04/19
                                     (02/16/19)
Doppler - Fetal Vessels

 Umbilical Artery
  S/D     %tile     RI              PI                     ADFV    RDFV
 3.84       95   0.74             1.33                        No      No

Impression

 Known IUGR
 UA Dopplers are at the 95th%.
 Biophysical profile [DATE]
Recommendations

 Initiate testing 2x weekly
 Repeat growth in 2 weeks.

## 2021-07-02 ENCOUNTER — Telehealth: Payer: Self-pay

## 2021-07-02 NOTE — Telephone Encounter (Signed)
Last OV 10/23/20. Received POT from [facility] requesting signature from Dr. Tarri Glenn and updated office notes. According to 10/23/20 OV, pt was advised to schedule f/u appt in 6-8 weeks. Review of appt history reveals pt never scheduled nor attended an appt around that time frame. Called pt to schedule f/u appt. Unable to reach d/t no answer. Letter mailed and sent via My Chart.

## 2021-07-03 NOTE — Telephone Encounter (Signed)
POT from [facility] signed by Dr. Tarri Glenn and faxed to [phone] along with 10/23/20 office notes. Confirmation received.  SECOND ATTEMPT:  Called pt to schedule follow up appt to ensure updated notes can be sent to [facility] to reduce risk for delay in care. Unable to reach d/t no answer.

## 2021-07-04 ENCOUNTER — Other Ambulatory Visit: Payer: Self-pay

## 2021-07-04 MED ORDER — INFLIXIMAB 100 MG IV SOLR: INTRAVENOUS | 3 refills | Status: DC

## 2021-07-04 NOTE — Telephone Encounter (Signed)
FINAL ATTEMPT:  Again called pt to attempt to schedule follow up appt. Letter sent previously. Will await pt calling us back to schedule f/u. If updated records required from [facility], pt will require appt. Failure to return our call or schedule f/u will ultimately result in delay of treatment.

## 2021-07-28 ENCOUNTER — Telehealth (INDEPENDENT_AMBULATORY_CARE_PROVIDER_SITE_OTHER): Payer: Medicaid Other | Admitting: Nurse Practitioner

## 2021-07-28 ENCOUNTER — Other Ambulatory Visit: Payer: Self-pay | Admitting: Nurse Practitioner

## 2021-07-28 ENCOUNTER — Other Ambulatory Visit: Payer: Self-pay

## 2021-07-28 DIAGNOSIS — J069 Acute upper respiratory infection, unspecified: Secondary | ICD-10-CM

## 2021-07-28 DIAGNOSIS — R059 Cough, unspecified: Secondary | ICD-10-CM

## 2021-07-28 MED ORDER — BENZONATATE 100 MG PO CAPS
100.0000 mg | ORAL_CAPSULE | Freq: Two times a day (BID) | ORAL | 0 refills | Status: DC | PRN
Start: 2021-07-28 — End: 2021-09-24

## 2021-07-28 NOTE — Progress Notes (Signed)
Subjective:    Patient ID: Deanna [name], female    DOB: 1996/01/22, 25 y.o.   MRN: [id number]  HPI: DEMICA [name] is a 25 y.o. female presenting virtually for fever and headache.  Chief Complaint  Patient presents with   URI   UPPER RESPIRATORY TRACT INFECTION Patient reports she has not taken infusion for Chron's disease in 2 months because they are seeing how her disease is controlled.  She wonders how much of the symptoms she has may be related. Onset: Friday 07/25/2021 COVID-19 testing history: negative at home Fever: yes; subjective Chills: yes Cough: yes Shortness of breath: no Wheezing: no Chest pain: no Chest tightness: yes Chest congestion: yes Nasal congestion: no Runny nose: yes Post nasal drip: yes Sneezing: yes Sore throat: no Swollen glands: no Sinus pressure: yes Headache: yes Face pain: yes Toothache: no Ear pain: no  Ear pressure: no  Eyes red/itching:no Eye drainage/crusting: no  Nausea: yes  Vomiting: yes Diarrhea: yes ; has been without Chron's infusion - has appointment to  Change in appetite: yes; decreased  Loss of taste/smell: no  Rash: no Fatigue: yes Sick contacts:  yes; boyfriend was sick Strep contacts: no  Context: stable Recurrent sinusitis: no Treatments attempted: Tylenol cold, Nyquil, Robitussin Relief with OTC medications: yes  No Known Allergies  Outpatient Encounter Medications as of 07/28/2021  Medication Sig   benzonatate (TESSALON) 100 MG capsule Take 1 capsule (100 mg total) by mouth 2 (two) times daily as needed for cough.   acetaminophen (TYLENOL) 500 MG tablet Take 1,000 mg by mouth every 6 (six) hours as needed for mild pain.   albuterol (VENTOLIN HFA) 108 (90 Base) MCG/ACT inhaler Inhale 2 puffs into the lungs every 4 (four) hours as needed for wheezing or shortness of breath.   azaTHIOprine (IMURAN) 50 MG tablet TAKE 1 TABLET BY MOUTH EVERY DAY   etonogestrel (NEXPLANON) 68 MG IMPL implant 1 each (68  mg total) by Subdermal route once for 1 dose. December 2020   inFLIXimab (REMICADE) 100 MG injection 5 mg/kg q 8 weeks IV   Melatonin 10 MG TABS Take 10 tablets by mouth daily as needed (sleep).   pantoprazole (PROTONIX) 40 MG tablet Take 1 tablet (40 mg total) by mouth in the morning.   sertraline (ZOLOFT) 100 MG tablet Take 2 tablets (200 mg total) by mouth at bedtime.   No facility-administered encounter medications on file as of 07/28/2021.    Patient Active Problem List   Diagnosis Date Noted   Long COVID 01/29/2021   Preterm premature rupture of membranes (PPROM) with unknown onset of labor 09/11/2019   Status post delivery at term 09/11/2019   Obesity in pregnancy 09/06/2019   H/O rapid labor 09/06/2019   IUGR (intrauterine growth restriction) affecting care of mother 06/25/2019   HGSIL (high grade squamous intraepithelial lesion) on Pap smear of cervix 04/12/2019   Supervision of high risk pregnancy, antepartum 02/06/2019   Depression with anxiety 10/27/2018   Exposure to STD 07/07/2018   Keloid 09/12/2013   Acne vulgaris 09/12/2013   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[place]) 09/30/2012   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 Interim medical history since our last visit reviewed.  Review of Systems Per HPI unless specifically indicated above     Objective:    There were no vitals taken for this visit.  Wt Readings from Last 3 Encounters:  04/02/21 268 lb 12.8 oz (121.9 kg)  04/02/21 268 lb 9.6 oz (121.8 kg)  01/29/21 273 lb 3.2 oz (123.9 kg)    Physical Exam Vitals and nursing note reviewed.  Constitutional:      General: She is not in acute  distress.    Appearance: Normal appearance. She is not toxic-appearing.  HENT:     Head: Normocephalic and atraumatic.     Right Ear: External ear normal.     Left Ear: External ear normal.     Nose: Nose normal. No congestion.     Mouth/Throat:     Mouth: Mucous membranes are moist.     Pharynx: Oropharynx is clear.  Eyes:     Extraocular Movements: Extraocular movements intact.  Cardiovascular:     Comments: Unable to assess heart sounds via virtual visit. Pulmonary:     Effort: Pulmonary effort is normal. No respiratory distress.     Comments: Unable to assess lung sounds via virtual visit.  Patient talking in complete sentences during telemedicine visit without accessory muscle use. Skin:    Coloration: Skin is not jaundiced or pale.     Findings: No erythema.  Neurological:     Mental Status: She is alert and oriented to person, place, and time.       Assessment & Plan:  1. Upper respiratory tract infection, unspecified type Acute x 4 days.  COVID test at home negative.  I do suspect some of the patient's symptoms may be related to Chron's disease.  Reassured patient that symptoms and exam findings are most consistent with a viral upper respiratory infection and explained lack of efficacy of antibiotics against viruses.  Discussed expected course and features suggestive of secondary bacterial infection.  Continue supportive care. Increase fluid intake with water or electrolyte solution like pedialyte. Encouraged acetaminophen as needed for fever/pain. Encouraged salt water gargling, chloraseptic spray and throat lozenges. Encouraged OTC guaifenesin. Encouraged saline sinus flushes and/or neti with humidified air.  Start Tessalon perles for cough.  Follow up if symptoms do not improve over next week.  - benzonatate (TESSALON) 100 MG capsule; Take 1 capsule (100 mg total) by mouth 2 (two) times daily as needed for cough.  Dispense: 20 capsule; Refill: 0     Follow up plan: Return  if symptoms worsen or fail to improve.   Due to the catastrophic nature of the COVID-19 pandemic, this video visit was completed soley via audio and visual contact via Caregility due to the restrictions of the COVID-19 pandemic.  All issues as above were discussed and addressed. Physical exam was done as above through visual confirmation on Caregility. If it was felt that the patient should be evaluated in the office, they were directed there. The patient verbally consented to this visit. Location of the patient: home Location of the provider: work Those involved with this call:  Provider: Noemi Chapel, DNP, FNP-C CMA: n/a Front Desk/Registration: Santina Evans  Time spent on call:  12 minutes with patient face to face via video conference. More than 50% of this time was spent in counseling and coordination of care. 15 minutes total spent in review of patient's record and preparation of their chart.  I verified patient identity using two factors (patient name and date of birth). Patient consents verbally to being seen via telemedicine visit today.

## 2021-07-29 ENCOUNTER — Other Ambulatory Visit: Payer: Medicaid Other

## 2021-07-30 ENCOUNTER — Other Ambulatory Visit: Payer: Medicaid Other

## 2021-07-30 DIAGNOSIS — K50919 Crohn's disease, unspecified, with unspecified complications: Secondary | ICD-10-CM

## 2021-07-30 DIAGNOSIS — R1013 Epigastric pain: Secondary | ICD-10-CM

## 2021-08-05 ENCOUNTER — Telehealth: Payer: Self-pay

## 2021-08-05 ENCOUNTER — Other Ambulatory Visit: Payer: Self-pay

## 2021-08-05 MED ORDER — INFLIXIMAB 100 MG IV SOLR: INTRAVENOUS | 3 refills | Status: DC

## 2021-08-05 NOTE — Telephone Encounter (Signed)
Numerous failed attempts to reach pt to schedule appt resulting in letter being mailed to pt 07/02/21. Pt still has not responded to our efforts. Regardless of changes required to biologic therapy, updated appt will still be required.

## 2021-08-05 NOTE — Telephone Encounter (Signed)
APPROVAL  Reference #: [id number] Medication: Remicade 151m IV SRyder BCBS [place] PU[place]response: APPROVED; QOtho Darnerapproved 490 units Approval dates: 08/04/21 - 08/03/22 Misc. Notes: EFFECTIVE 09/21/21, REMICADE WILL NO LONGER BE PREFERRED. AVSOLA AND INFLECTRA WILL BE PREFERRED. PROVIDERS MAY SWITCH TO EITHER AVSOLA OR INFLECTRA STARTING 09/21/21 WITHOUT A NEW PRIOR AUTHORIZATION. IF CHOOSING TO REMAIN ON REMICADE, NEW PRIOR AUTHORIZATION FOR REMICADE WILL BE REQUIRED.  To reduce risk of gap in care, routing this message to Dr. BTarri Glennto make her aware.

## 2021-08-05 NOTE — Telephone Encounter (Signed)
MAR updated to reflect Dr. Tarri Glenn request re: refills to Remicade

## 2021-08-06 LAB — CALPROTECTIN: Calprotectin: 155 mcg/g — ABNORMAL HIGH

## 2021-08-21 NOTE — Progress Notes (Signed)
Office Visit Note  Patient: Deanna [name]             Date of Birth: Feb 11, 1996           MRN: [id number]             PCP: Eulogio Bear, NP Referring: Eulogio Bear, NP Visit Date: 08/22/2021 Occupation: @GUAROCC @  Subjective:  Pain in entire spine.   History of Present Illness: Deanna [name] is a 25 y.o. female with history of Crohn's disease and arthralgias.  She states that Dr. Tarri Glenn is started her on Remicade infusions in October 2022.  She has noticed improvement in her joint symptoms and also her GI symptoms.  She denies any joint pain or joint swelling.  Although she continues to have pain in her entire spine.  She denies any history of diarrhea or constipation or blood in her stool.  Activities of Daily Living:  Patient reports morning stiffness for all day.  Patient Reports nocturnal pain.  Difficulty dressing/grooming: Denies Difficulty climbing stairs: Reports Difficulty getting out of chair: Denies Difficulty using hands for taps, buttons, cutlery, and/or writing: Reports  Review of Systems  Constitutional:  Positive for fatigue.  HENT:  Negative for mouth sores, mouth dryness and nose dryness.   Eyes:  Negative for pain, itching and dryness.  Respiratory:  Negative for shortness of breath and difficulty breathing.   Cardiovascular:  Negative for chest pain and palpitations.  Gastrointestinal:  Negative for blood in stool, constipation and diarrhea.  Endocrine: Negative for increased urination.  Genitourinary:  Negative for difficulty urinating.  Musculoskeletal:  Positive for joint pain, joint pain, myalgias, muscle weakness, morning stiffness, muscle tenderness and myalgias. Negative for joint swelling.  Skin:  Negative for color change, rash, redness and sensitivity to sunlight.  Allergic/Immunologic: Positive for susceptible to infections.  Neurological:  Positive for headaches and weakness. Negative for dizziness, numbness and memory  loss.  Hematological:  Positive for bruising/bleeding tendency.  Psychiatric/Behavioral:  Negative for confusion.    PMFS History:  Patient Active Problem List   Diagnosis Date Noted   Long COVID 01/29/2021   Preterm premature rupture of membranes (PPROM) with unknown onset of labor 09/11/2019   Status post delivery at term 09/11/2019   Obesity in pregnancy 09/06/2019   H/O rapid labor 09/06/2019   IUGR (intrauterine growth restriction) affecting care of mother 06/25/2019   HGSIL (high grade squamous intraepithelial lesion) on Pap smear of cervix 04/12/2019   Supervision of high risk pregnancy, antepartum 02/06/2019   Depression with anxiety 10/27/2018   Exposure to STD 07/07/2018   Keloid 09/12/2013   Acne vulgaris 09/12/2013   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[place]) 09/30/2012   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    Past Medical History:  Diagnosis Date   Anxiety    Arthritis, 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[place])    Crohn disease ([place])    dx in 2012   Depression    Hemorrhoid    Infection    UTI x many    Family History  Problem Relation Age of Onset   Diabetes Maternal Grandmother    Gallstones Mother    Anxiety disorder Mother    Depression Mother    Schizophrenia Father    Eczema Father    Stomach cancer Maternal Aunt    Colon cancer Maternal Aunt    Diabetes Maternal Grandfather    Heart disease Maternal Grandfather    Stroke Maternal Grandfather  Colon cancer Maternal Grandfather    Healthy Son    Healthy Son    Colon polyps Neg Hx    Esophageal cancer Neg Hx    Rectal cancer Neg Hx    Past Surgical History:  Procedure Laterality Date   COLONOSCOPY     COLONOSCOPY  2014   [facility]   TONSILLECTOMY     Social History   Social History Narrative   Not on file   Immunization History  Administered Date(s) Administered   DTaP 10/25/1996, 12/25/1996, 02/23/1997, 01/22/1998, 09/30/2001   HPV 9-valent 07/17/2015   HPV  Quadrivalent 09/12/2013   Hepatitis A, Ped/Adol-2 Dose 09/12/2013, 07/17/2015   Hepatitis B January 16, 1996, 10/25/1996, 05/25/1997   HiB (PRP-OMP) 10/25/1996, 12/25/1996, 02/23/1997, 01/22/1998   IPV 10/25/1996, 12/25/1996, 08/27/1997, 09/30/2001   Influenza,inj,Quad PF,6+ Mos 09/12/2013, 07/17/2015, 06/11/2016, 07/12/2019   MMR 08/27/1997, 09/30/2001   Meningococcal Conjugate 09/12/2013   PFIZER(Purple Top)SARS-COV-2 Vaccination 12/30/2019, 01/23/2020   PPD Test 06/22/2014   Tdap 04/02/2008, 09/01/2016, 07/12/2019   Varicella 08/27/1997, 04/02/2008     Objective: Vital Signs: BP 129/84 (BP Location: Left Arm, Patient Position: Sitting, Cuff Size: Large)   Pulse 99   Ht 5' 8.5" (1.74 m)   Wt 271 lb 3.2 oz (123 kg)   BMI 40.64 kg/m    Physical Exam Vitals and nursing note reviewed.  Constitutional:      Appearance: She is well-developed.  HENT:     Head: Normocephalic and atraumatic.  Eyes:     Conjunctiva/sclera: Conjunctivae normal.  Cardiovascular:     Rate and Rhythm: Normal rate and regular rhythm.     Heart sounds: Normal heart sounds.  Pulmonary:     Effort: Pulmonary effort is normal.     Breath sounds: Normal breath sounds.  Abdominal:     General: Bowel sounds are normal.     Palpations: Abdomen is soft.  Musculoskeletal:     Cervical back: Normal range of motion.  Lymphadenopathy:     Cervical: No cervical adenopathy.  Skin:    General: Skin is warm and dry.     Capillary Refill: Capillary refill takes less than 2 seconds.  Neurological:     Mental Status: She is alert and oriented to person, place, and time.  Psychiatric:        Behavior: Behavior normal.     Musculoskeletal Exam: C-spine was in good range of motion.  Thoracic and lumbar spine were in good range of motion.  She has excessive lumbar lordosis.  No SI joint tenderness was noted.  Shoulder joints with good range of motion.  She has bilateral elbow joint contractures.  There was no synovitis of  her MCPs PIPs or DIPs.  Hip joints, knee joints, ankles, MTPs and PIPs with good range of motion with no synovitis.  CDAI Exam: CDAI Score: -- Patient Global: --; Provider Global: -- Swollen: --; Tender: -- Joint Exam 08/22/2021   No joint exam has been documented for this visit   There is currently no information documented on the homunculus. Go to the Rheumatology activity and complete the homunculus joint exam.  Investigation: No additional findings.  Imaging: No results found.  Recent Labs: Lab Results  Component Value Date   WBC 8.8 04/02/2021   HGB 13.7 04/02/2021   PLT 270 04/02/2021   NA 134 (L) 10/23/2020   K 3.8 10/23/2020   CL 103 10/23/2020   CO2 26 10/23/2020   GLUCOSE 86 10/23/2020   BUN 8 10/23/2020   CREATININE 0.68  10/23/2020   BILITOT 0.3 10/23/2020   ALKPHOS 58 10/23/2020   AST 10 10/23/2020   ALT 7 10/23/2020   PROT 7.9 10/23/2020   ALBUMIN 4.1 10/23/2020   CALCIUM 9.1 10/23/2020   GFRAA 114 10/20/2019   QFTBGOLDPLUS NEGATIVE 10/20/2019    Speciality Comments: No specialty comments available.  Procedures:  No procedures performed Allergies: Patient has no known allergies.   Assessment / Plan:     Visit Diagnoses: Pain in thoracic spine - The x-ray of the thoracic spine were unremarkable.  She continues to have pain and discomfort in her thoracic spine.  I will refer her to physical therapy.  Chronic midline low back pain without sciatica - X-ray of the lumbar spine were unremarkable.  She has increased lumbar lordosis.  She has chronic lower back pain.  I will refer her to physical therapy.  Chronic SI joint pain - Bilateral SI joint sclerosis was noted.  No SI joint narrowing was noted.  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  She has contractures in her bilateral elbows.  No synovitis was noted.  Crohn's disease of both small and large  intestine with fistula [facility]) - Most March 2012.  Followed by Dr. Tarri Glenn.  Treated with IV Remicade from 2012 -2014.  She was restarted on IV Remicade in October 2022.  She has noticed improvement in her symptoms on Remicade.  She has been tolerating IV Remicade without any side effects.  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  Treated with methotrexate, Arava, sulfasalazine in the past.  Treated with IV Remicade for Crohn's disease.  High risk medication use - Remicade IV started in October 2022.  Patient is not sure if she is on Imuran currently.  The medications are prescribed  by Dr. Tarri Glenn .  Labs are monitored by Dr. Tarri Glenn.  Myofascial pain-she continues to have some generalized pain and discomfort.  Need for regular exercise and stretching was discussed.  Association of smoking with increased myofascial pain was also discussed.  Smoking cessation was discussed.  Other fatigue-most likely related to myofascial pain.  Acne vulgaris  Depression with anxiety-she is on Zoloft.  HGSIL (high grade squamous intraepithelial lesion) on Pap smear of cervix  Family history of psoriasis - her father.  Smoker-she smokes 2 packs/day.  Smoking cessation was discussed.  Increase association of autoimmune disease, lung cancer and myofascial pain with smoking was discussed.  Orders: Orders Placed This Encounter  Procedures   Ambulatory referral to Physical Therapy    No orders of the defined types were placed in this encounter.   Follow-Up Instructions: Return in about 6 months (around 02/20/2022) for Arthralgia, Crohn's disease.   Bo Merino, MD  Note - This record has been created using Editor, commissioning.  Chart creation errors have been sought, but may not always  have been located. Such creation errors do not reflect on  the standard of medical care.

## 2021-08-22 ENCOUNTER — Other Ambulatory Visit: Payer: Self-pay

## 2021-08-22 ENCOUNTER — Ambulatory Visit: Payer: Medicaid Other | Admitting: Rheumatology

## 2021-08-22 ENCOUNTER — Encounter: Payer: Self-pay | Admitting: Rheumatology

## 2021-08-22 VITALS — BP 129/84 | HR 99 | Ht 68.5 in | Wt 271.2 lb

## 2021-08-22 DIAGNOSIS — M0880 Other juvenile arthritis, unspecified site: Secondary | ICD-10-CM

## 2021-08-22 DIAGNOSIS — R5383 Other fatigue: Secondary | ICD-10-CM

## 2021-08-22 DIAGNOSIS — M545 Low back pain, unspecified: Secondary | ICD-10-CM | POA: Diagnosis not present

## 2021-08-22 DIAGNOSIS — M533 Sacrococcygeal disorders, not elsewhere classified: Secondary | ICD-10-CM

## 2021-08-22 DIAGNOSIS — R87613 High grade squamous intraepithelial lesion on cytologic smear of cervix (HGSIL): Secondary | ICD-10-CM

## 2021-08-22 DIAGNOSIS — L700 Acne vulgaris: Secondary | ICD-10-CM

## 2021-08-22 DIAGNOSIS — G8929 Other chronic pain: Secondary | ICD-10-CM

## 2021-08-22 DIAGNOSIS — M7918 Myalgia, other site: Secondary | ICD-10-CM

## 2021-08-22 DIAGNOSIS — M24521 Contracture, right elbow: Secondary | ICD-10-CM

## 2021-08-22 DIAGNOSIS — Z79899 Other long term (current) drug therapy: Secondary | ICD-10-CM

## 2021-08-22 DIAGNOSIS — M546 Pain in thoracic spine: Secondary | ICD-10-CM | POA: Diagnosis not present

## 2021-08-22 DIAGNOSIS — K50813 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: Secondary | ICD-10-CM

## 2021-08-22 DIAGNOSIS — F17200 Nicotine dependence, unspecified, uncomplicated: Secondary | ICD-10-CM

## 2021-08-22 DIAGNOSIS — F418 Other specified anxiety disorders: Secondary | ICD-10-CM

## 2021-08-22 DIAGNOSIS — Z840 Family history of diseases of the skin and subcutaneous tissue: Secondary | ICD-10-CM

## 2021-08-22 DIAGNOSIS — M24522 Contracture, left elbow: Secondary | ICD-10-CM

## 2021-08-27 ENCOUNTER — Telehealth: Payer: Self-pay | Admitting: Gastroenterology

## 2021-08-27 NOTE — Telephone Encounter (Signed)
Noted and addressed in 08/05/21 encounter. Closing as duplicate.

## 2021-08-27 NOTE — Telephone Encounter (Signed)
Katie with [facility] called about patient's Remicade.  Starting 09/21/21, her insurance will not pay for Remicade unless Avsola or Inflectra has been tried and failed.  So, she will need an order for those to begin January 1.  Thank you.

## 2021-09-01 ENCOUNTER — Ambulatory Visit: Payer: Medicaid Other | Admitting: Gastroenterology

## 2021-09-07 ENCOUNTER — Ambulatory Visit (HOSPITAL_COMMUNITY): Admit: 2021-09-07 | Discharge status: Against Medical Advice | Payer: Medicaid Other

## 2021-09-18 ENCOUNTER — Telehealth: Payer: Self-pay | Admitting: Gastroenterology

## 2021-09-18 NOTE — Telephone Encounter (Signed)
Patient called wanting to speak with someone regarding her colonoscopy done on 05/20/20.  At her last visit, Dr. Tarri Glenn told her it should have been coded as routine, but it was coded as medical and she is being charged $800.  Please call and advise.

## 2021-09-18 NOTE — Telephone Encounter (Signed)
The phone number for [facility] is [phone]. (That was the number on the caller ID.)  Called above mentioned # and spoke with [name]. Advised this pt has not followed up with our office since 10/23/20. Per Dr. Tarri Glenn, orders cannot be provided without an appt. Pt is aware yet has failed to schedule appt. States she will update pt account to reflect our conversation.

## 2021-09-18 NOTE — Telephone Encounter (Signed)
Called pt to make her aware of Amy Hazelwood's response below. LVM requesting returned call. In addition, pt is in need of a f/u appt in order to refill any future requests for her biologic and/order biosimilar.

## 2021-09-18 NOTE — Telephone Encounter (Signed)
The girl I spoke with earlier was named Scientist, research (physical sciences).  I also found this number if the other one I gave you doesn't work.  [phone]

## 2021-09-18 NOTE — Telephone Encounter (Signed)
We can't help patients with anything after the procedure has been done because we only do precerts and not billing.  This patient is not of age for a "routine, screening, or preventative".  She has Crohn's so it will always be diagnostic.

## 2021-09-18 NOTE — Telephone Encounter (Signed)
The phone number for [facility] is [phone]. (That was the number on the caller ID.)

## 2021-09-18 NOTE — Telephone Encounter (Signed)
Received a call from [place] at [facility] regarding this patient.  She is still waiting for the new order for either Avsola or Inflectra.  Her appointment is on 10/01/21.  Fax number is [phone].  Thank you.

## 2021-09-22 ENCOUNTER — Ambulatory Visit: Payer: Medicaid Other

## 2021-09-24 ENCOUNTER — Telehealth (INDEPENDENT_AMBULATORY_CARE_PROVIDER_SITE_OTHER): Payer: Medicaid Other | Admitting: Nurse Practitioner

## 2021-09-24 ENCOUNTER — Other Ambulatory Visit: Payer: Self-pay

## 2021-09-24 ENCOUNTER — Encounter: Payer: Self-pay | Admitting: Nurse Practitioner

## 2021-09-24 DIAGNOSIS — J329 Chronic sinusitis, unspecified: Secondary | ICD-10-CM

## 2021-09-24 DIAGNOSIS — B9689 Other specified bacterial agents as the cause of diseases classified elsewhere: Secondary | ICD-10-CM

## 2021-09-24 MED ORDER — AMOXICILLIN-POT CLAVULANATE 875-125 MG PO TABS: 1.0000 | ORAL_TABLET | Freq: Two times a day (BID) | ORAL | 0 refills | Status: AC

## 2021-09-24 NOTE — Progress Notes (Signed)
Subjective:    Patient ID: Deanna [name], female    DOB: 1996/07/28, 26 y.o.   MRN: [id number]  HPI: Deanna [name] is a 26 y.o. female presenting virtually for congestion, sinus pressure, fevers.  Chief Complaint  Patient presents with   Cough   Sinusitis   UPPER RESPIRATORY TRACT INFECTION Onset: weeks Fever: yes Cough: yes Shortness of breath: yes Wheezing: no Chest pain: no Chest tightness: no Chest congestion: no Nasal congestion: yes Runny nose: yes Post nasal drip: yes Sneezing: yes Sore throat: yes Swollen glands: yes Sinus pressure: yes; right eye/face Headache: yes Face pain: no Toothache: no Ear pain: yes ; right ear Ear pressure: no  Eyes red/itching:yes Eye drainage/crusting: no  Nausea: yes Vomiting: yes Diarrhea: no  Change in appetite: yes; decreased  Loss of taste/smell: yes  Rash: no Fatigue: yes Sick contacts: children are sick - they improved except 1 son had pink eye Strep contacts: no  Context: worse Recurrent sinusitis: no Treatments attempted: Nyquil and Dayquil, Tylenol  Relief with OTC medications: no  No Known Allergies  Outpatient Encounter Medications as of 09/24/2021  Medication Sig   amoxicillin-clavulanate (AUGMENTIN) 875-125 MG tablet Take 1 tablet by mouth 2 (two) times daily for 7 days.   acetaminophen (TYLENOL) 500 MG tablet Take 1,000 mg by mouth every 6 (six) hours as needed for mild pain.   etonogestrel (NEXPLANON) 68 MG IMPL implant 1 each (68 mg total) by Subdermal route once for 1 dose. December 2020   inFLIXimab (REMICADE) 100 MG injection !!! EFFECTIVE 08/05/21 - NO REFILLS WITHOUT AN OFFICE VISIT !!! 5 mg/kg q 8 weeks IV   Melatonin 10 MG TABS Take 10 tablets by mouth daily as needed (sleep).   pantoprazole (PROTONIX) 40 MG tablet Take 1 tablet (40 mg total) by mouth in the morning. (Patient taking differently: Take 40 mg by mouth as needed.)   PROAIR HFA 108 (90 Base) MCG/ACT inhaler INHALE 2 PUFFS  INTO THE LUNGS EVERY 4 HOURS AS NEEDED FOR WHEEZING OR SHORTNESS OF BREATH.   sertraline (ZOLOFT) 100 MG tablet Take 2 tablets (200 mg total) by mouth at bedtime.   [DISCONTINUED] azaTHIOprine (IMURAN) 50 MG tablet TAKE 1 TABLET BY MOUTH EVERY DAY   [DISCONTINUED] benzonatate (TESSALON) 100 MG capsule Take 1 capsule (100 mg total) by mouth 2 (two) times daily as needed for cough.   No facility-administered encounter medications on file as of 09/24/2021.    Patient Active Problem List   Diagnosis Date Noted   Long COVID 01/29/2021   Preterm premature rupture of membranes (PPROM) with unknown onset of labor 09/11/2019   Status post delivery at term 09/11/2019   Obesity in pregnancy 09/06/2019   H/O rapid labor 09/06/2019   IUGR (intrauterine growth restriction) affecting care of mother 06/25/2019   HGSIL (high grade squamous intraepithelial lesion) on Pap smear of cervix 04/12/2019   Supervision of high risk pregnancy, antepartum 02/06/2019   Depression with anxiety 10/27/2018   Exposure to STD 07/07/2018   Keloid 09/12/2013   Acne vulgaris 09/12/2013   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 Interim medical history since our last visit reviewed.  Review of Systems Per HPI unless specifically indicated above     Objective:    LMP 08/25/2021 (Approximate)   Wt Readings from Last 3 Encounters:  08/22/21 271 lb 3.2 oz (123 kg)  04/02/21 268 lb 12.8 oz (121.9 kg)  04/02/21 268 lb 9.6 oz (121.8 kg)    Physical Exam Vitals and nursing note reviewed.  Constitutional:      General: She is not in acute distress.     Appearance: Normal appearance. She is not ill-appearing, toxic-appearing or diaphoretic.  HENT:     Head: Normocephalic and atraumatic.     Nose: Congestion present. No rhinorrhea.     Mouth/Throat:     Mouth: Mucous membranes are moist.     Pharynx: Oropharynx is clear.  Eyes:     General: No scleral icterus.       Right eye: No discharge.        Left eye: No discharge.     Extraocular Movements: Extraocular movements intact.  Pulmonary:     Effort: Pulmonary effort is normal. No respiratory distress.  Skin:    Coloration: Skin is not jaundiced or pale.     Findings: No erythema.  Neurological:     Mental Status: She is alert and oriented to person, place, and time.  Psychiatric:        Mood and Affect: Mood normal.        Behavior: Behavior normal.        Thought Content: Thought content normal.        Judgment: Judgment normal.       Assessment & Plan:  1. Bacterial sinusitis Acute.  Suspect bacterial infection given length of symptoms and severity.  Start Augmentin.  Continue supportive management with humidifier, nasal rinses, Mucinex.  Follow up if symptoms worsen or persist despite treatment.   - amoxicillin-clavulanate (AUGMENTIN) 875-125 MG tablet; Take 1 tablet by mouth 2 (two) times daily for 7 days.  Dispense: 14 tablet; Refill: 0    Follow up plan: Return if symptoms worsen or fail to improve.  Due to the catastrophic nature of the COVID-19 pandemic, this video visit was completed soley via audio and visual contact via Caregility due to the restrictions of the COVID-19 pandemic.  All issues as above were discussed and addressed. Physical exam was done as above through visual confirmation on Caregility. If it was felt that the patient should be evaluated in the office, they were directed there. The patient verbally consented to this visit. Location of the patient: home Location of the provider: work Those involved with this call:  Provider: Noemi Chapel,  DNP, FNP-C CMA: n/a Front Desk/Registration: Vevelyn Pat  Time spent on call:  10 minutes with patient face to face via video conference. More than 50% of this time was spent in counseling and coordination of care. 16 minutes total spent in review of patient's record and preparation of their chart. I verified patient identity using two factors (patient name and date of birth). Patient consents verbally to being seen via telemedicine visit today.

## 2021-10-09 ENCOUNTER — Telehealth: Payer: Self-pay | Admitting: Physician Assistant

## 2021-10-09 ENCOUNTER — Telehealth: Payer: Self-pay

## 2021-10-09 ENCOUNTER — Encounter: Payer: Self-pay | Admitting: Physician Assistant

## 2021-10-09 ENCOUNTER — Telehealth (INDEPENDENT_AMBULATORY_CARE_PROVIDER_SITE_OTHER): Payer: BC Managed Care – PPO | Admitting: Physician Assistant

## 2021-10-09 DIAGNOSIS — R630 Anorexia: Secondary | ICD-10-CM | POA: Diagnosis not present

## 2021-10-09 DIAGNOSIS — R1013 Epigastric pain: Secondary | ICD-10-CM

## 2021-10-09 DIAGNOSIS — K50919 Crohn's disease, unspecified, with unspecified complications: Secondary | ICD-10-CM

## 2021-10-09 DIAGNOSIS — R194 Change in bowel habit: Secondary | ICD-10-CM

## 2021-10-09 DIAGNOSIS — Z79899 Other long term (current) drug therapy: Secondary | ICD-10-CM

## 2021-10-09 MED ORDER — AVSOLA 100 MG IV SOLR: 5.0000 mg/kg | INTRAVENOUS | 5 refills | Status: AC

## 2021-10-09 NOTE — Telephone Encounter (Signed)
Ms. Deanna, [name] are scheduled for a virtual visit with your provider today.    Just as we do with appointments in the office, we must obtain your consent to participate.  Your consent will be active for this visit and any virtual visit you may have with one of our providers in the next 365 days.    If you have a MyChart account, I can also send a copy of this consent to you electronically.  All virtual visits are billed to your insurance company just like a traditional visit in the office.  As this is a virtual visit, video technology does not allow for your provider to perform a traditional examination.  This may limit your provider's ability to fully assess your condition.  If your provider identifies any concerns that need to be evaluated in person or the need to arrange testing such as labs, EKG, etc, we will make arrangements to do so.    Although advances in technology are sophisticated, we cannot ensure that it will always work on either your end or our end.  If the connection with a video visit is poor, we may have to switch to a telephone visit.  With either a video or telephone visit, we are not always able to ensure that we have a secure connection.   I need to obtain your verbal consent now.   Are you willing to proceed with your visit today?   Deanna [name] has provided verbal consent on 10/09/2021 for a virtual visit (video or telephone).   [place], [place] 10/09/2021  10:11 AM

## 2021-10-09 NOTE — Telephone Encounter (Signed)
-----   Message from Levin Erp, [place] sent at 10/09/2021 11:01 AM EST ----- Regarding: can you order remicade for me? Can you please refill this patient's Remicade for me.  When I was putting in the order I was not exactly sure how to do it and wanted it to be right for her.  Thank you so much, refill can be good for a year.  Also can you call and make her a follow-up appointment with Dr. Tarri Glenn or myself in 9 to 10 weeks.  I would like her to have this dose of Remicade and another before being seen in clinic next.  Thanks, JLL

## 2021-10-09 NOTE — Telephone Encounter (Signed)
Remicade no longer preferred as of 09/21/21.  Avsola 123m IV; APPROVED 09/21/21 through 08/03/22; Reference #[id number]; [facility]  [facility]vsola [facility] of treatment [phone]  Lm on vm for patient to return call.

## 2021-10-09 NOTE — Progress Notes (Signed)
Reviewed and agree with management plans. Adherence with prescribed therapy and subsequent monitoring is critical.   Joelene Millin L. Tarri Glenn, MD, MPH

## 2021-10-09 NOTE — Patient Instructions (Signed)
Mrs. Stepp,  As we discussed at your visit today I will refill your Remicade.  Please go there soon to get your next dose as you are off schedule.  My nurse will also call you to make a follow-up appointment in 9 to 10 weeks.  If you have any increase or worsening symptoms in between then and now please call and let us know.  It was a pleasure to see you today, Ellouise Newer, PA-C

## 2021-10-09 NOTE — Progress Notes (Signed)
Virtual Visit via Video Note  I connected with Deanna [name] on 10/09/21 at 10:30 AM EST by a video enabled telemedicine application and verified that I am speaking with the correct person using two identifiers.  Location: Patient: Home Provider: [facility]   I discussed the limitations of evaluation and management by telemedicine and the availability of in person appointments. The patient expressed understanding and agreed to proceed.  Chief Complaint: Follow-up Crohn's disease  HPI:    Mrs. Deanna [name] is a 26 year old African-American female with a past medical history of Crohn's disease and psoriatic arthritis, known to Dr. Tarri Glenn, who is seen via virtual visit today for follow-up of her Crohn's disease.    07/2020 colonoscopy was normal, distal TI with ulcers consistent with Crohn's    10/23/2020 patient seen in clinic by Dr. Tarri Glenn and at that time describes some epigastric pain.  Also history of fistulizing Crohn's disease diagnosed in 2012 when she presented with abdominal pain, bloody stools and a 60 pound weight loss.  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  At time of last visit she had been off of therapy for at least 3 years.  MRA 09/23/2012 showed severe inflammatory changes of the lower pelvis with multiple enteroenteric fistulas and inflammatory changes of the terminal ileum, sigmoid colon and mesentery.  At that time patient preferred Remicade.  Also added Azathioprine 50 mg daily.  At that time had labs including infliximab levels and antibodies as well as CMP and CBC with differential and fecal calprotectin.  She was resumed on Pantoprazole 40 mg every morning.    04/02/2021 normal CBC.    07/30/2021 calprotectin elevated at 155    Today, the patient tells me that she has had a lot going on lately and has found it hard to keep up with  her medications.  Tells me she is actually overdue for her Remicade infusion as this was due last week.  Describes that over the past couple of weeks to couple of years she has had problems noting that she has a bowel movement sometimes 6 times a day but it is typically small "Hershey kisses" and is not loose.  She is not seeing any blood.  Tells me that she never started the Azathioprine but recalls the name.  Explains along with this she has had a decrease in appetite and has been trying to eat better more healthier foods.  Also feels very fatigued.  Describes that she also recently had a sinus infection and feels like the antibiotics made everything worse.  Explains it is hard for her to decipher exactly what her normal is at the moment since she has had other things going on.  She does want to get better control of her Crohn's and follow this more closely so that we can get her on the right medications.    Denies fever, chills, weight loss, blood in her stool or symptoms that awaken her from sleep.  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     etonogestrel (NEXPLANON) 68 MG IMPL implant 1 each (68 mg total) by Subdermal route once for 1 dose. December 2020 1 each 0   inFLIXimab (REMICADE) 100 MG injection !!! EFFECTIVE 08/05/21 - NO REFILLS WITHOUT AN OFFICE VISIT !!! 5 mg/kg q 8 weeks IV 1 each 3   Melatonin 10 MG TABS Take 10 tablets by mouth daily as needed (sleep).     pantoprazole (PROTONIX) 40 MG tablet Take 1 tablet (40 mg total) by mouth in the morning.  (Patient taking differently: Take 40 mg by mouth as needed.) 90 tablet 3   PROAIR HFA 108 (90 Base) MCG/ACT inhaler INHALE 2 PUFFS INTO THE LUNGS EVERY 4 HOURS AS NEEDED FOR WHEEZING OR SHORTNESS OF BREATH. 8.5 each 3   sertraline (ZOLOFT) 100 MG tablet Take 2 tablets (200 mg total) by mouth at bedtime. 180 tablet 1   No current facility-administered medications for this visit.    Allergies as of 10/09/2021   (No Known Allergies)    Family History  Problem Relation Age of Onset   Diabetes Maternal Grandmother    Gallstones Mother    Anxiety disorder Mother    Depression Mother    Schizophrenia Father    Eczema Father    Stomach cancer Maternal Aunt    Colon cancer Maternal Aunt    Diabetes Maternal Grandfather    Heart disease Maternal Grandfather    Stroke Maternal Grandfather    Colon cancer Maternal Grandfather    Healthy Son    Healthy Son    Colon polyps Neg Hx    Esophageal cancer Neg Hx    Rectal cancer Neg Hx     Social History   Socioeconomic History   Marital status: Significant Other    Spouse name: Not on file   Number of children: 1   Years of education: Not on file   Highest education level: Not on file  Occupational History   Occupation: student  Tobacco Use   Smoking status: Every Day    Types: Cigarettes   Smokeless tobacco: Never   Tobacco comments:    vaping in high school  Vaping Use   Vaping Use: Former  Substance and Sexual Activity   Alcohol use: No   Drug use: No   Sexual activity: Yes    Birth control/protection: None  Other Topics Concern   Not on file  Social History Narrative   Not on file   Social Determinants of Health   Financial Resource Strain: Not on file  Food Insecurity: Not on file  Transportation Needs: Not on file  Physical Activity: Not on file  Stress: Not on file  Social Connections: Not on file  Intimate Partner Violence: Not on file    Review of Systems:    Constitutional: No weight loss, fever or  chills Skin: No rash  Cardiovascular: No chest pain Respiratory: No SOB  Gastrointestinal: See HPI and otherwise negative Genitourinary: No dysuria  Neurological: No headache, dizziness or syncope Musculoskeletal: No new muscle or joint pain Hematologic: No bleeding  Psychiatric: +depression   Physical Exam:    Virtual Visit NO Vitals  Constitutional:   Pleasant AA female appears to be in NAD, Well developed, Well nourished, alert and cooperative Head:  Normocephalic and atraumatic. Eyes:   No icterus. Conjunctiva pink.  Ears:  Normal auditory acuity. Psychiatric: Demonstrates good judgement and reason without abnormal affect or behaviors.  MOST RECENT LABS AND IMAGING: CBC    Component Value Date/Time   WBC 8.8 04/02/2021 1514   RBC 4.72 04/02/2021 1514   HGB 13.7 04/02/2021 1514   HGB 12.0 07/12/2019 0901   HCT 42.3 04/02/2021 1514   HCT 38.0 07/12/2019 0901   PLT 270 04/02/2021 1514   PLT 212 07/12/2019 0901   MCV 89.6 04/02/2021 1514   MCV 91 07/12/2019 0901   MCH 29.0 04/02/2021 1514   MCHC 32.4 04/02/2021 1514   RDW 13.5 04/02/2021 1514   RDW 13.1 07/12/2019 0901   LYMPHSABS 2,904 04/02/2021 1514   LYMPHSABS 1.8 04/12/2019 1101   MONOABS 0.6 10/23/2020 1637   EOSABS 502 (H) 04/02/2021 1514   EOSABS 0.1 04/12/2019 1101   BASOSABS 62 04/02/2021 1514   BASOSABS 0.0 04/12/2019 1101    CMP     Component Value Date/Time   NA 134 (L) 10/23/2020 1637   NA 136 04/12/2019 1101   K 3.8 10/23/2020 1637   CL 103 10/23/2020 1637   CO2 26 10/23/2020 1637   GLUCOSE 86 10/23/2020 1637   BUN 8 10/23/2020 1637   BUN 7 04/12/2019 1101   CREATININE 0.68 10/23/2020 1637   CREATININE 0.84 10/20/2019 1448   CALCIUM 9.1 10/23/2020 1637   PROT 7.9 10/23/2020 1637   PROT 7.4 04/12/2019 1101   ALBUMIN 4.1 10/23/2020 1637   ALBUMIN 3.9 04/12/2019 1101   AST 10 10/23/2020 1637   ALT 7 10/23/2020 1637   ALKPHOS 58 10/23/2020 1637   BILITOT 0.3 10/23/2020 1637   BILITOT  <0.2 04/12/2019 1101   GFRNONAA 98 10/20/2019 1448   GFRAA 114 10/20/2019 1448    Assessment: 1.  Crohn's disease: Fistulizing Crohn's disease diagnosed in 2012, currently maintained on Remicade every 8 weeks, does experience an increase in bowel movements though these are not loose as well as some decrease in appetite and fatigue (though picture is not needed little with patient recently on antibiotics for a sinusitis and being off schedule with her Remicade); consider complication of Crohn's 2.  Decrease in appetite: With above first recent antibiotic  Plan: 1.  Discussed with the patient that it is hard to decipher at this very moment, given that she has had a lot of things going on, what her normal is.  It sounds like she may have never had full control of her Crohn's disease over the past year, but again she is off schedule with her Remicade and was recently on antibiotics for sinus infection which also could be contributing to some of her symptoms. 2.  At this time refilled Remicade 5 mg/kg every 8 weeks, good for a year 3.  Encouraged the patient to continue healthy eating 3.  Discussed that she will need close follow-up with [place].  I would like her to have this Remicade and then 1 more and then be seen in clinic so we can see how she is feeling on this regular schedule.  If her Crohn's still feels uncontrolled may need to add back in Azathioprine 50 mg daily which was started by Dr. Tarri Glenn in the past but apparently patient never took. 4.  Patient is also due for maintenance labs including a CMP, CBC and likely repeat fecal calprotectin at that time as well as possible Remicade levels. 5.  I will have my nurse Delila Spence call the patient and make her an appointment in  clinic with Dr. Tarri Glenn or myself in 9 weeks.  This will allow her to have had 2 Remicade doses and we can see how she is really doing.  In the meantime if she has any increase or worsening of symptoms she will call and let  us know.  Ellouise Newer, PA-C [facility] 10/09/2021, 10:11 AM  Cc: Eulogio Bear, NP

## 2021-10-10 NOTE — Telephone Encounter (Signed)
Lm on vm for patient to return call 

## 2021-10-14 ENCOUNTER — Telehealth: Payer: BC Managed Care – PPO | Admitting: Physician Assistant

## 2021-10-14 ENCOUNTER — Encounter: Payer: Self-pay | Admitting: Nurse Practitioner

## 2021-10-14 DIAGNOSIS — K5090 Crohn's disease, unspecified, without complications: Secondary | ICD-10-CM | POA: Diagnosis not present

## 2021-10-14 MED ORDER — PREDNISONE 10 MG PO TABS: ORAL_TABLET | ORAL | 0 refills | Status: AC

## 2021-10-14 MED ORDER — ONDANSETRON 4 MG PO TBDP: 4.0000 mg | ORAL_TABLET | Freq: Three times a day (TID) | ORAL | 0 refills | Status: DC | PRN

## 2021-10-14 NOTE — Progress Notes (Signed)
Virtual Visit Consent   Deanna [name], you are scheduled for a virtual visit with a [facility] provider today.     Just as with appointments in the office, your consent must be obtained to participate.  Your consent will be active for this visit and any virtual visit you may have with one of our providers in the next 365 days.     If you have a MyChart account, a copy of this consent can be sent to you electronically.  All virtual visits are billed to your insurance company just like a traditional visit in the office.    As this is a virtual visit, video technology does not allow for your provider to perform a traditional examination.  This may limit your provider's ability to fully assess your condition.  If your provider identifies any concerns that need to be evaluated in person or the need to arrange testing (such as labs, EKG, etc.), we will make arrangements to do so.     Although advances in technology are sophisticated, we cannot ensure that it will always work on either your end or our end.  If the connection with a video visit is poor, the visit may have to be switched to a telephone visit.  With either a video or telephone visit, we are not always able to ensure that we have a secure connection.     I need to obtain your verbal consent now.   Are you willing to proceed with your visit today?    Deanna [name] has provided verbal consent on 10/14/2021 for a virtual visit (video or telephone).   Leeanne Rio, [place]   Date: 10/14/2021 1:08 PM   Virtual Visit via Video Note   I, Leeanne Rio, connected with  Deanna [name]  ([id number], 1996-03-07) on 10/14/21 at 12:45 PM EST by a video-enabled telemedicine application and verified that I am speaking with the correct person using two identifiers.  Location: Patient: Virtual Visit Location Patient: Home Provider: Virtual Visit Location Provider: Home Office   I discussed the limitations of evaluation and  management by telemedicine and the availability of in person appointments. The patient expressed understanding and agreed to proceed.    History of Present Illness: Deanna [name] is a 26 y.o. who identifies as a female who was assigned female at birth, and is being seen today for possible flares of her Crohn's disease over the past couple of weeks with some intermittent stomach cramping and discomfort associated with some loose stool and fecal urgency. Denies fever, chills, bloody stools. Some mucous noted. Notes triggering some intermittent migraines. Notes nausea with one episode of emesis -- non-bloody. Has been able to keep well-hydrated. Is able to eat but notes anorexia. Has reached out to her PCP who is out of office. Also called her Gastroenterologist and was triaged. The earliest she can be seen by them is 1/31. Was most recently on Remicade with last infusion 2 months ago. Is being started on Asvola soon. Denies any recent travel or sick contact.   HPI: HPI  Problems:  Patient Active Problem List   Diagnosis Date Noted   Long COVID 01/29/2021   Preterm premature rupture of membranes (PPROM) with unknown onset of labor 09/11/2019   Status post delivery at term 09/11/2019   Obesity in pregnancy 09/06/2019   H/O rapid labor 09/06/2019   IUGR (intrauterine growth restriction) affecting care of mother 06/25/2019   HGSIL (high grade squamous intraepithelial lesion) on  Pap smear of cervix 04/12/2019   Supervision of high risk pregnancy, antepartum 02/06/2019   Depression with anxiety 10/27/2018   Exposure to STD 07/07/2018   Keloid 09/12/2013   Acne vulgaris 09/12/2013   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 December 2020, Disp: 1 each, Rfl: 0   inFLIXimab-axxq (AVSOLA) 100 MG injection, Inject 600 mg into the vein every 8 (eight) weeks., Disp: 1 each, Rfl: 5   Melatonin 10 MG TABS, Take 10 tablets by mouth daily as needed (sleep)., Disp: , Rfl:    ondansetron (ZOFRAN-ODT) 4 MG disintegrating tablet, Take 1 tablet (4 mg total) by mouth every 8 (eight) hours as needed for nausea or vomiting., Disp: 20 tablet, Rfl: 0   pantoprazole (PROTONIX) 40 MG tablet, Take 1 tablet (40 mg total) by mouth in the morning. (Patient taking differently: Take 40 mg by mouth as needed.), Disp: 90 tablet, Rfl: 3   predniSONE (DELTASONE) 10 MG tablet, Take 4 tablets (40 mg total) by mouth daily with breakfast for 5 days, THEN 3 tablets (30 mg total) daily with breakfast for 2 days. Until you are seen by your GI provider., Disp: 26 tablet, Rfl: 0   PROAIR HFA 108 (90 Base) MCG/ACT inhaler, INHALE 2 PUFFS INTO THE LUNGS EVERY 4 HOURS AS NEEDED FOR WHEEZING OR SHORTNESS OF BREATH., Disp: 8.5 each, Rfl: 3   sertraline (ZOLOFT) 100 MG tablet, Take 2 tablets (200 mg total) by mouth at bedtime., Disp: 180 tablet, Rfl: 1  Observations/Objective: Patient is well-developed, well-nourished in no acute distress.  Resting comfortably on couch at home.  Head is normocephalic, atraumatic.  No labored breathing. Speech is clear and coherent with logical content.  Patient is alert and oriented at baseline.   Assessment and Plan: 1. Crohn's disease without complication, unspecified gastrointestinal tract location ([name]) - ondansetron (ZOFRAN-ODT) 4 MG disintegrating tablet; Take 1 tablet (4 mg total) by mouth every 8 (eight) hours as needed for nausea or vomiting.  Dispense: 20 tablet; Refill: 0 - predniSONE (DELTASONE) 10 MG tablet; Take 4 tablets (40 mg total) by mouth daily with breakfast for 5 days, THEN 3 tablets  (30 mg total) daily with breakfast for 2 days. Until you are seen by your GI provider.  Dispense: 26 tablet; Refill: 0  Mild flares recently with current flare. No alarm signs or symptoms present. Has been off of Remicade a few months. Has appt with GI scheduled. Wills tart prednisone 40 mg daily for 5 days, then decrease to 30 mg to start calming this down until her appt. Rx zofran for nausea. Strict ER precautions reviewed.   Follow Up Instructions: I discussed the assessment and treatment plan with the patient. The patient was provided an opportunity to ask questions and all were answered. The patient agreed with the plan and demonstrated an understanding of the instructions.  A copy of instructions were sent to the patient via MyChart unless otherwise noted below.    The patient was advised to call back or seek an in-person evaluation if the symptoms worsen or if the condition fails to improve  as anticipated.  Time:  I spent 12 minutes with the patient via telehealth technology discussing the above problems/concerns.    Leeanne Rio, PA-C

## 2021-10-14 NOTE — Addendum Note (Signed)
Addended by: Yevette Edwards on: 10/14/2021 01:29 PM   Modules accepted: Orders

## 2021-10-14 NOTE — Telephone Encounter (Signed)
Lets do fecal calprotectin and gi path panel, esr, crp and cbc/cmp. If it looks like she is in a flare we may need to have her on prednisone for a bit.   Thanks-JLL

## 2021-10-14 NOTE — Telephone Encounter (Signed)
Lm on vm for patient to return call 

## 2021-10-14 NOTE — Telephone Encounter (Signed)
Patient notified of recommendations via my chart as requested.   Lab and stool study orders in epic.

## 2021-10-14 NOTE — Telephone Encounter (Signed)
Pt returned call. She sent a my chart message stating that she felt like she was having a flare. Pt reports that this morning she felt lightheaded, had lower abdominal cramping and lower back pain. Pt has had 2 BM's today. She states that she strained some with the 1st BM, she described as a small volume stool. 2nd BM was all liquid. She denies any fever or chills. She did have nausea vomiting x 1 this morning. She states that it tasted like a "gerd, a sour throw up". She has been drinking water and had rice and shrimp for dinner. Pt states that she tried to take the pantoprazole but couldn't. She is calling [facility] today to get set up for her Avsola infusion. The schedulers scheduled pt to see Dr. Tarri Glenn on Friday, 10/31/21 at 11:10 am. Pt has also been scheduled for her 9-week f/u appt with Dr. Tarri Glenn on Monday, 12/15/21 at 1:30 pm. Please advise on recommendations.

## 2021-10-14 NOTE — Patient Instructions (Signed)
Florentina Addison, thank you for joining Leeanne Rio, PA-C for today's virtual visit.  While this provider is not your primary care provider (PCP), if your PCP is located in our provider database this encounter information will be shared with them immediately following your visit.  Consent: (Patient) Deanna [name] provided verbal consent for this virtual visit at the beginning of the encounter.  Current Medications:  Current Outpatient Medications:    ondansetron (ZOFRAN-ODT) 4 MG disintegrating tablet, Take 1 tablet (4 mg total) by mouth every 8 (eight) hours as needed for nausea or vomiting., Disp: 20 tablet, Rfl: 0   predniSONE (DELTASONE) 10 MG tablet, Take 4 tablets (40 mg total) by mouth daily with breakfast for 5 days, THEN 3 tablets (30 mg total) daily with breakfast for 2 days. Until you are seen by your GI provider., Disp: 26 tablet, Rfl: 0   acetaminophen (TYLENOL) 500 MG tablet, Take 1,000 mg by mouth every 6 (six) hours as needed for mild pain., Disp: , Rfl:    etonogestrel (NEXPLANON) 68 MG IMPL implant, 1 each (68 mg total) by Subdermal route once for 1 dose. December 2020, Disp: 1 each, Rfl: 0   inFLIXimab-axxq (AVSOLA) 100 MG injection, Inject 600 mg into the vein every 8 (eight) weeks., Disp: 1 each, Rfl: 5   Melatonin 10 MG TABS, Take 10 tablets by mouth daily as needed (sleep)., Disp: , Rfl:    pantoprazole (PROTONIX) 40 MG tablet, Take 1 tablet (40 mg total) by mouth in the morning. (Patient taking differently: Take 40 mg by mouth as needed.), Disp: 90 tablet, Rfl: 3   PROAIR HFA 108 (90 Base) MCG/ACT inhaler, INHALE 2 PUFFS INTO THE LUNGS EVERY 4 HOURS AS NEEDED FOR WHEEZING OR SHORTNESS OF BREATH., Disp: 8.5 each, Rfl: 3   sertraline (ZOLOFT) 100 MG tablet, Take 2 tablets (200 mg total) by mouth at bedtime., Disp: 180 tablet, Rfl: 1   Medications ordered in this encounter:  Meds ordered this encounter  Medications   ondansetron (ZOFRAN-ODT) 4 MG  disintegrating tablet    Sig: Take 1 tablet (4 mg total) by mouth every 8 (eight) hours as needed for nausea or vomiting.    Dispense:  20 tablet    Refill:  0    Order Specific Question:   Supervising Provider    Answer:   Sabra Heck, BRIAN [[id number]]   predniSONE (DELTASONE) 10 MG tablet    Sig: Take 4 tablets (40 mg total) by mouth daily with breakfast for 5 days, THEN 3 tablets (30 mg total) daily with breakfast for 2 days. Until you are seen by your GI provider.    Dispense:  26 tablet    Refill:  0    Order Specific Question:   Supervising Provider    Answer:   Sabra Heck, BRIAN [[id number]]     *If you need refills on other medications prior to your next appointment, please contact your pharmacy*  Follow-Up: Call back or seek an in-person evaluation if the symptoms worsen or if the condition fails to improve as anticipated.  Other Instructions Please keep hydrated and follow a bland diet. Use the zofran as directed for nausea. Start the steroids, taking as directed. This should last until your follow-up with GI where they can continue taper, adjust treatment as needed.  IF there are any worsening symptoms and your GI provider cannot get you in, you need an ER evaluation. DO NOT DELAY CARE!   If you have been instructed to have  an in-person evaluation today at a local Urgent Care facility, please use the link below. It will take you to a list of all of our available [facility], including address, phone number and hours of operation. Please do not delay care.  [facility]  If you or a family member do not have a primary care provider, use the link below to schedule a visit and establish care. When you choose a [facility] primary care physician or advanced practice provider, you gain a long-term partner in health. Find a Primary Care Provider  Learn more about [facility]'s in-office and virtual care options: [facility]

## 2021-10-16 NOTE — Telephone Encounter (Signed)
Patient has not reveiwed recommendations via my chart. I left pt a detailed vm letting her know that I sent Jennifer's recommendations to her via my chart. I told her to call us back to discuss or send a my chart message letting me know that she has received the information.

## 2021-10-17 NOTE — Telephone Encounter (Signed)
Patient has reviewed my chart message and has responded. See 10/14/21 patient message for details.

## 2021-10-20 ENCOUNTER — Other Ambulatory Visit (INDEPENDENT_AMBULATORY_CARE_PROVIDER_SITE_OTHER): Payer: Medicaid Other

## 2021-10-20 DIAGNOSIS — D84821 Immunodeficiency due to drugs: Secondary | ICD-10-CM | POA: Diagnosis not present

## 2021-10-20 DIAGNOSIS — K50919 Crohn's disease, unspecified, with unspecified complications: Secondary | ICD-10-CM | POA: Diagnosis not present

## 2021-10-20 DIAGNOSIS — R194 Change in bowel habit: Secondary | ICD-10-CM

## 2021-10-20 DIAGNOSIS — R1013 Epigastric pain: Secondary | ICD-10-CM

## 2021-10-20 DIAGNOSIS — Z79899 Other long term (current) drug therapy: Secondary | ICD-10-CM

## 2021-10-20 LAB — COMPREHENSIVE METABOLIC PANEL
ALT: 7 U/L (ref 0–35)
AST: 8 U/L (ref 0–37)
Albumin: 3.7 g/dL (ref 3.5–5.2)
Alkaline Phosphatase: 55 U/L (ref 39–117)
BUN: 13 mg/dL (ref 6–23)
CO2: 25 mEq/L (ref 19–32)
Calcium: 8.8 mg/dL (ref 8.4–10.5)
Chloride: 105 mEq/L (ref 96–112)
Creatinine, Ser: 0.71 mg/dL (ref 0.40–1.20)
GFR: 118.39 mL/min (ref >60.00)
Glucose, Bld: 87 mg/dL (ref 70–99)
Potassium: 4 mEq/L (ref 3.5–5.1)
Sodium: 137 mEq/L (ref 135–145)
Total Bilirubin: 0.4 mg/dL (ref 0.2–1.2)
Total Protein: 7.3 g/dL (ref 6.0–8.3)

## 2021-10-20 LAB — CBC WITH DIFFERENTIAL/PLATELET
Basophils Absolute: 0.1 10*3/uL (ref 0.0–0.1)
Basophils Relative: 1 % (ref 0.0–3.0)
Eosinophils Absolute: 0.3 10*3/uL (ref 0.0–0.7)
Eosinophils Relative: 2.5 % (ref 0.0–5.0)
HCT: 41.9 % (ref 36.0–46.0)
Hemoglobin: 13.8 g/dL (ref 12.0–15.0)
Lymphocytes Relative: 36.1 % (ref 12.0–46.0)
Lymphs Abs: 4.3 10*3/uL — ABNORMAL HIGH (ref 0.7–4.0)
MCHC: 32.9 g/dL (ref 30.0–36.0)
MCV: 87.6 fl (ref 78.0–100.0)
Monocytes Absolute: 0.9 10*3/uL (ref 0.1–1.0)
Monocytes Relative: 7.5 % (ref 3.0–12.0)
Neutro Abs: 6.3 10*3/uL (ref 1.4–7.7)
Neutrophils Relative %: 52.9 % (ref 43.0–77.0)
Platelets: 259 10*3/uL (ref 150.0–400.0)
RBC: 4.78 Mil/uL (ref 3.87–5.11)
RDW: 14.3 % (ref 11.5–15.5)
WBC: 11.9 10*3/uL — ABNORMAL HIGH (ref 4.0–10.5)

## 2021-10-20 LAB — SEDIMENTATION RATE: Sed Rate: 36 mm/hr — ABNORMAL HIGH (ref 0–20)

## 2021-10-20 LAB — C-REACTIVE PROTEIN: CRP: <1 mg/dL (ref 0.5–20.0)

## 2021-10-23 ENCOUNTER — Other Ambulatory Visit: Payer: Medicaid Other

## 2021-10-23 DIAGNOSIS — K50919 Crohn's disease, unspecified, with unspecified complications: Secondary | ICD-10-CM

## 2021-10-23 DIAGNOSIS — R194 Change in bowel habit: Secondary | ICD-10-CM

## 2021-10-23 DIAGNOSIS — Z79899 Other long term (current) drug therapy: Secondary | ICD-10-CM

## 2021-10-23 DIAGNOSIS — R1013 Epigastric pain: Secondary | ICD-10-CM

## 2021-10-24 ENCOUNTER — Encounter: Payer: Self-pay | Admitting: Gastroenterology

## 2021-10-26 LAB — GI PROFILE, STOOL, PCR
Adenovirus F 40/41: NOT DETECTED
Astrovirus: NOT DETECTED
C difficile toxin A/B: DETECTED — AB
Campylobacter: NOT DETECTED
Cryptosporidium: NOT DETECTED
Cyclospora cayetanensis: NOT DETECTED
Entamoeba histolytica: NOT DETECTED
Enteroaggregative E coli: DETECTED — AB
Enteropathogenic E coli: NOT DETECTED
Enterotoxigenic E coli: NOT DETECTED
Giardia lamblia: NOT DETECTED
Norovirus GI/GII: NOT DETECTED
Plesiomonas shigelloides: NOT DETECTED
Rotavirus A: NOT DETECTED
Salmonella: NOT DETECTED
Sapovirus: NOT DETECTED
Shiga-toxin-producing E coli: NOT DETECTED
Shigella/Enteroinvasive E coli: NOT DETECTED
Vibrio cholerae: NOT DETECTED
Vibrio: NOT DETECTED
Yersinia enterocolitica: NOT DETECTED

## 2021-10-26 LAB — CALPROTECTIN, FECAL: Calprotectin, Fecal: 382 ug/g — ABNORMAL HIGH (ref 0–120)

## 2021-10-27 ENCOUNTER — Telehealth: Payer: Self-pay | Admitting: Gastroenterology

## 2021-10-27 DIAGNOSIS — A0472 Enterocolitis due to Clostridium difficile, not specified as recurrent: Secondary | ICD-10-CM

## 2021-10-27 NOTE — Telephone Encounter (Signed)
Received a call from [facility] with a Critical Alert for patient, as follows:  C. Diff. A & B Toxin - detected E-Coli - detected

## 2021-10-28 MED ORDER — VANCOMYCIN HCL 250 MG PO CAPS: 250.0000 mg | ORAL_CAPSULE | Freq: Four times a day (QID) | ORAL | 0 refills | Status: DC

## 2021-10-28 NOTE — Telephone Encounter (Signed)
SECOND ATTEMPT:  Pt has not viewed My Chart message. Phone rings x2 then beeps repeatedly. No other methods of communication found. DPR indicates we are not speak with anyone but pt.

## 2021-10-28 NOTE — Telephone Encounter (Signed)
Attempted to call pt to make her aware of results and recommendations as documented by Dr. Tarri Glenn. Unable to reach. Phone rang x2 the continued to beep repeatedly. My Chart message sent.

## 2021-10-29 NOTE — Telephone Encounter (Signed)
FINAL ATTEMPT:  Called pt to advise about results and new Rx. Again, phone rings x2, then disconnects. Pt still has not read My Chart message. Called [facility] @ [phone] to determine if pt had picked up Rx. Spoke with Wells Guiles. Was advised that the medication needed to be ordered and will be processed for Rx pick up today. Pt means of communication re: Rx pick up is texting. Wells Guiles has flagged pt account indicating our struggles to reach pt AND to notify the pt to call our office as well as to notify me of pt Rx pick up. Direct extension provided. Pt is scheduled for f/u with Dr. Tarri Glenn on 10/31/21. Routing this message to Dr. Tarri Glenn to make her aware of failed attempts.

## 2021-10-31 ENCOUNTER — Encounter: Payer: Self-pay | Admitting: Gastroenterology

## 2021-10-31 ENCOUNTER — Ambulatory Visit (INDEPENDENT_AMBULATORY_CARE_PROVIDER_SITE_OTHER): Payer: BC Managed Care – PPO | Admitting: Gastroenterology

## 2021-10-31 VITALS — BP 114/78 | HR 88 | Ht 68.0 in | Wt 276.0 lb

## 2021-10-31 DIAGNOSIS — K50919 Crohn's disease, unspecified, with unspecified complications: Secondary | ICD-10-CM

## 2021-10-31 NOTE — Patient Instructions (Addendum)
It does show that you are positive for C diff and E coli. Please follow the recommendations below:   Please start vancomycin 125 mg orally 4 times daily for 10 days. This prescription has been sent to your pharmacy. Please follow up with your pharmacy regarding the status of your pick up. Infection may be due to recent antibiotics for C diff. To ensure you are well hydrated, drink a lot of liquids that have water, salt, and sugar. Good choices are water mixed with juice, flavored soda, and soup broth. If you are drinking enough, your urine will be light yellow or almost clear.  Try to eat a little food. Good choices are potatoes, noodles, rice, oatmeal, crackers, bananas, soup, and boiled vegetables.  Avoid high fat foods, as they can make diarrhea worse.  Dairy products (except yogurt) may be difficult to digest when you have diarrhea. I recommend that you temporarily avoid lactose-containing foods.  A daily probiotic may be very helpful.    I have recommended a colonoscopy after your infection is fully treated.

## 2021-10-31 NOTE — Progress Notes (Signed)
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    -Diagnosed in 2012 when she presented with abdominal pain, bloody stools, and a 60 pound weight loss    - Colon biopsies 2013 showed normal random biopsies except for focal erosion at the IC valve       - Procedure performed at [facility], procedure note not available in [facility] [place]       - Has been treated with Remicade, methotrexate, and Arava    - Off therapy for at least 3 years    - MRE 09/23/12:  severe inflammatory changes of the lower pelvis with multiple enteroenteric fistulas       - inflammatory changes of the terminal ileum, sigmoid colon, and mesentery  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 Patient preferred Remicade, although I had originally recommended Humira and azathioprine. Will obtain levels and antibodies given her breakthrough symptoms. Will add azathioprine in the meantime. Discussed risks of long term immunosuppression.   We discussed the need for close contact and follow-up. I will be unable to continue to prescribe biologic therapy if we are unable to reach her to review results when they are available.    PLAN: - infectious diarrhea instructions in patient instructions - vancomycin 125 mg orally 4 times daily for 10 days - Continue pantoprazole 40 mg QAM - Fecal calprotectin in one month - Infliximab levels and antibodies prior to the next infusion - Add azatioprine 50 mg daily after C Diff has resolved - She will obtain her vaccination history from her mother - Smoking cessation recommended - Colonoscopy - Follow-up in the office after her colonoscopy  HPI: Deanna [name] is a 26 y.o. female with a history  of JIA/psoriatic arthritis and Crohn's disease previously on MTX and Arava.  She is now followed by Dr. Humberto Seals for rheumatoid arthritis.  Returns in follow-up.   She is concerned that the Remicade isn't working. She has ongoing RUQ pain, constipation, and left knee pain.  Symptoms are improved for 2 days after her infusion but are then recurring.   Having recent diarrhea. She is having 6 bowel movements daily without blood or mucous. GI pathogen panel positive for C diff and Enteroaggregative E coli. We have been unable to reach her by phone to discuss these results.   IBD history: Diagnosed in 2012 presenting with abdominal pain, bloody diarrhea, 60 pound weight loss; [facility] until [facility]; MRE 11/22/18 showed A 7.8 cm segment of thickened and enhancing terminal ileum extends to the cecum, and likely represents moderately inflamed active Crohn's disease, radiologist unable to exclude fistula; no flares during pregnancy x 2  Surgical history: None  Current medications: None Prior medications: Enbrel +MTX combination (2012)-Then lost follow up until 2013; infliximab (last infusion 08/2013); 2015: MTX (25 mg po weekly), Arava 20 mg daily (noncompliance taking medications);  Switched from MTX to SSZ in 08/2014. Her symptoms were well controlled on MTX and Arava.  She remembers the Remicade being the most helpful treatment.   Last colonoscopy: 07/2020: normal colon, distal TI ulcers consistent with Crohn's Extraintestinal manifestations: JIA (Dr. Estanislado Pandy) Other medical history: None Vaccination history: Unknown    Past Medical History:  Diagnosis Date   Anxiety    Arthritis, 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[place])  Crohn disease ([place])    dx in 2012   Depression    Hemorrhoid    Infection    UTI x many    Past Surgical History:  Procedure Laterality Date   COLONOSCOPY     COLONOSCOPY  2014   [facility]   TONSILLECTOMY      Current Outpatient Medications  Medication Sig Dispense Refill    acetaminophen (TYLENOL) 500 MG tablet Take 1,000 mg by mouth every 6 (six) hours as needed for mild pain.     etonogestrel (NEXPLANON) 68 MG IMPL implant 1 each (68 mg total) by Subdermal route once for 1 dose. December 2020 1 each 0   inFLIXimab-axxq (AVSOLA) 100 MG injection Inject 600 mg into the vein every 8 (eight) weeks. 1 each 5   Melatonin 10 MG TABS Take 10 tablets by mouth daily as needed (sleep).     ondansetron (ZOFRAN-ODT) 4 MG disintegrating tablet Take 1 tablet (4 mg total) by mouth every 8 (eight) hours as needed for nausea or vomiting. 20 tablet 0   pantoprazole (PROTONIX) 40 MG tablet Take 1 tablet (40 mg total) by mouth in the morning. (Patient taking differently: Take 40 mg by mouth as needed.) 90 tablet 3   PROAIR HFA 108 (90 Base) MCG/ACT inhaler INHALE 2 PUFFS INTO THE LUNGS EVERY 4 HOURS AS NEEDED FOR WHEEZING OR SHORTNESS OF BREATH. 8.5 each 3   sertraline (ZOLOFT) 100 MG tablet Take 2 tablets (200 mg total) by mouth at bedtime. 180 tablet 1   No current facility-administered medications for this visit.    Allergies as of 10/31/2021   (No Known Allergies)    Family History  Problem Relation Age of Onset   Diabetes Maternal Grandmother    Gallstones Mother    Anxiety disorder Mother    Depression Mother    Schizophrenia Father    Eczema Father    Stomach cancer Maternal Aunt    Colon cancer Maternal Aunt    Diabetes Maternal Grandfather    Heart disease Maternal Grandfather    Stroke Maternal Grandfather    Colon cancer Maternal Grandfather    Healthy Son    Healthy Son    Colon polyps Neg Hx    Esophageal cancer Neg Hx    Rectal cancer Neg Hx     Social History   Socioeconomic History   Marital status: Significant Other    Spouse name: Not on file   Number of children: 1   Years of education: Not on file   Highest education level: Not on file  Occupational History   Occupation: student  Tobacco Use   Smoking status: Every Day    Types:  Cigarettes   Smokeless tobacco: Never   Tobacco comments:    vaping in high school  Vaping Use   Vaping Use: Former  Substance and Sexual Activity   Alcohol use: No   Drug use: No   Sexual activity: Yes    Birth control/protection: None  Other Topics Concern   Not on file  Social History Narrative   Not on file   Social Determinants of Health   Financial Resource Strain: Not on file  Food Insecurity: Not on file  Transportation Needs: Not on file  Physical Activity: Not on file  Stress: Not on file  Social Connections: Not on file  Intimate Partner Violence: Not on file    Filed Weights   10/31/21 1120  Weight: 276 lb (125.2 kg)    Physical Exam:  Vital signs were reviewed. General:   Alert, well-nourished, pleasant and cooperative in NAD Head:  Normocephalic and atraumatic. Eyes:  Sclera clear, no icterus.   Conjunctiva pink. Abdomen: Soft, central obesity, mild epigastric tender, normal bowel sounds. No rebound or guarding. No hepatosplenomegaly Rectal:  Deferred  Msk:  Symmetrical without gross deformities. Extremities:  No gross deformities or edema. Neurologic:  Alert and  oriented x4;  grossly nonfocal Skin:  No rash or bruise. Psych:  Alert and cooperative. Normal mood and affect.   Liylah Najarro L. Tarri Glenn, MD, MPH [facility] 10/31/2021, 11:29 AM

## 2021-11-04 ENCOUNTER — Telehealth: Payer: Self-pay | Admitting: Gastroenterology

## 2021-11-04 DIAGNOSIS — A044 Other intestinal Escherichia coli infections: Secondary | ICD-10-CM

## 2021-11-04 DIAGNOSIS — A0472 Enterocolitis due to Clostridium difficile, not specified as recurrent: Secondary | ICD-10-CM

## 2021-11-04 NOTE — Telephone Encounter (Signed)
Signed DPR does not allow staff to speak with anyone other than pt. Updated DPR will need to be obtained if pt wishes to change whom we may speak with re: her healthcare.

## 2021-11-04 NOTE — Telephone Encounter (Signed)
My chart message is as follows:  "Having a reaction to the Vancomycin , itchiness and headache - also bad abdominal cramping on left side "

## 2021-11-04 NOTE — Telephone Encounter (Signed)
Called pt to inquire further about her reaction. Spoke with Fiance and advised I will need to speak directly to the pt to discuss her concerns and provide appropriate recommendations. States he will have the pt return my call.

## 2021-11-05 MED ORDER — DIFICID 200 MG PO TABS: 200.0000 mg | ORAL_TABLET | Freq: Two times a day (BID) | ORAL | 0 refills | Status: AC

## 2021-11-05 NOTE — Telephone Encounter (Signed)
Fidaxomicin (Dificid) 200 mg twice daily for 10 days.

## 2021-11-05 NOTE — Telephone Encounter (Signed)
Rx'd Vanco 113m po QID x10days for C diff and E coli. Last OV 10/31/21 with Dr. BTarri Glenn Last Avsola infusion 10/21/21. Scheduled for next infusion week of 12/08/21 (pt could not remember exact date) with f/u appt scheduled with Dr. BTarri Glenn3/27/23.  Pt initially reached out to the office via My Chart and sent Deanna Newer PA-C a message attached to a previous conversation entitled "ADA Form" about her allergic reaction and to request further advice. Pt also called the office to speak with Dr. BTarri Glennnurse. Given communication barriers d/t pt not having a functional phone and having provided office with her fiance's # whom is not listed on her DPR, My Chart message was sent as follows:  Hi Deanna [name],   I have called the # on file to speak directly with you regarding your concerns. Your signed release does not allow [place] speak with anyone EXCEPT you. If you are having an adverse reaction, such as "itching", please stop the Vancomycin. I would like to speak with you further about your "headaches" and "sharp pains in the left side" as this could be something unrelated to an adverse reaction to Vancomycin. If you develop shortness of breath or difficulties breathing, including throat swelling, or any oral swelling, please proceed directly to the ED further evaluation and treatment.   Thank you  Last read by Deanna Addisonat  8:45 AM on 11/05/2021.  Following My Chart message received today:  Me to Deanna [name]      8:40 AM Thank you, however, this appears to be the same # as your fiance. Again, please be aware, you will need to update your release to allow our office to discuss your health care concerns with him or other parties. Please call the office so that we can discuss your symptoms further and determine the most appropriate course of treatment.   Thank you  Last read by Deanna Addisonat  8:45 AM on 11/05/2021. Deanna [name] to [facility] (supporting Deanna [name]      8:28 AM Hi nurse Deanna [name], my apologies  I will stop the medicine , and my new personal number is [phone] Pt called per my request via My Chart. Inquired further about her adverse reaction to Vanco. States she picked up her Rx and took 1 dose of Vanco on Friday 10/31/21. States she continued as Rx'd on Saturday and Sunday. She developed itching to her extremities and abd on Sunday night through Tuesday, when she reached out to our office for recommendations. States she only had cramping on Saturday night which resolved on Sunday. Also had developed a headache on Saturday which also resolved on Sunday. Pt added she wasn't sure if these symptoms were related to her adverse reaction to Vanco. Asked if she had noticed improvement within the last 24 hrs since stopping Vanco. Pt does have slight itching to her arms and abd without rash or raised areas of concern. Provided the following recommendations:  May apply topical Benadryl to affected areas for comfort May take oral Benadryl prn until itching has resolved Drink 64 ounces or more of fluid Will discuss with DOD, Dr. PHenrene Pastorto determine appropriate course of treatment for E Coli and C Diff given allergic reaction to Vanco  Vanco added to allergy list given adverse reaction

## 2021-11-05 NOTE — Telephone Encounter (Signed)
New Rx sent to pt preferred pharmacy per Dr. Blanch Media orders. Called pt to make her aware of new orders. LVM requesting returned call.

## 2021-11-05 NOTE — Telephone Encounter (Signed)
Pt returned call. Informed about new orders. Advised to call if not covered by insurance or unaffordable. Verbalized acceptance and understanding.

## 2021-11-05 NOTE — Telephone Encounter (Signed)
Patient returned call. Best contact number [phone]

## 2021-11-09 NOTE — Telephone Encounter (Signed)
Noted  

## 2021-11-19 ENCOUNTER — Other Ambulatory Visit: Payer: Self-pay

## 2021-11-19 DIAGNOSIS — E876 Hypokalemia: Secondary | ICD-10-CM

## 2021-11-19 NOTE — Progress Notes (Signed)
error 

## 2021-11-20 ENCOUNTER — Other Ambulatory Visit: Payer: Self-pay

## 2021-11-20 ENCOUNTER — Telehealth: Payer: Self-pay

## 2021-11-20 DIAGNOSIS — K50919 Crohn's disease, unspecified, with unspecified complications: Secondary | ICD-10-CM

## 2021-11-20 NOTE — Telephone Encounter (Signed)
Pt to have fecal calprotectin, infliximab serial monitor and BMP completed. According to pt, she would complete on 3/1. Called pt to f/u as it appears these labs have not yet been completed. LVM requesting returned call. ?

## 2021-11-21 NOTE — Telephone Encounter (Signed)
SECOND ATTEMPT: ° °LVM requesting returned call. °

## 2021-11-24 ENCOUNTER — Other Ambulatory Visit (INDEPENDENT_AMBULATORY_CARE_PROVIDER_SITE_OTHER): Payer: BC Managed Care – PPO

## 2021-11-24 DIAGNOSIS — E876 Hypokalemia: Secondary | ICD-10-CM | POA: Diagnosis not present

## 2021-11-24 DIAGNOSIS — K50919 Crohn's disease, unspecified, with unspecified complications: Secondary | ICD-10-CM

## 2021-11-24 LAB — BASIC METABOLIC PANEL
BUN: 15 mg/dL (ref 6–23)
CO2: 25 mEq/L (ref 19–32)
Calcium: 8.7 mg/dL (ref 8.4–10.5)
Chloride: 104 mEq/L (ref 96–112)
Creatinine, Ser: 0.73 mg/dL (ref 0.40–1.20)
GFR: 114.44 mL/min (ref >60.00)
Glucose, Bld: 85 mg/dL (ref 70–99)
Potassium: 4.3 mEq/L (ref 3.5–5.1)
Sodium: 136 mEq/L (ref 135–145)

## 2021-11-24 NOTE — Telephone Encounter (Signed)
THIRD ATTEMPT: ? ?As of this entry, labs still have not been completed nor stool sample submitted. LVM requesting returned call. ?

## 2021-11-25 NOTE — Telephone Encounter (Signed)
Labs have been collected and pending at this time. Still awaiting pt submission of stool sample. ?

## 2021-11-26 ENCOUNTER — Other Ambulatory Visit: Payer: BC Managed Care – PPO

## 2021-11-26 DIAGNOSIS — K50919 Crohn's disease, unspecified, with unspecified complications: Secondary | ICD-10-CM

## 2021-11-26 NOTE — Telephone Encounter (Signed)
Fecal Calprotectin collected today (3/8). Will await all results and call with Dr. Tarri Glenn recommendations. ?

## 2021-11-30 LAB — CALPROTECTIN, FECAL: Calprotectin, Fecal: 87 ug/g (ref 0–120)

## 2021-12-04 LAB — INFLIXIMAB+AB (SERIAL MONITOR)
Anti-Infliximab Antibody: <22 ng/mL
Infliximab Drug Level: 8 ug/mL

## 2021-12-15 ENCOUNTER — Encounter: Payer: Self-pay | Admitting: Gastroenterology

## 2021-12-15 ENCOUNTER — Ambulatory Visit (INDEPENDENT_AMBULATORY_CARE_PROVIDER_SITE_OTHER): Payer: BC Managed Care – PPO | Admitting: Gastroenterology

## 2021-12-15 VITALS — BP 130/84 | HR 92 | Ht 67.0 in | Wt 279.0 lb

## 2021-12-15 DIAGNOSIS — K50919 Crohn's disease, unspecified, with unspecified complications: Secondary | ICD-10-CM

## 2021-12-15 DIAGNOSIS — Z79899 Other long term (current) drug therapy: Secondary | ICD-10-CM

## 2021-12-15 DIAGNOSIS — D84821 Immunodeficiency due to drugs: Secondary | ICD-10-CM

## 2021-12-15 NOTE — Patient Instructions (Addendum)
I am so glad that you are feeling better. ? ?This would be a great time to quit smoking! Which is very important while you are taking infliximab.  ? ?I did not change your medications today.  ? ?Please follow-up with Dr. Estanislado Pandy to evaluate your wrist and shoulder pain.  ? ?I recommend that you use sunscreen and sun-protective clothing whenever you are outside. ? ?Please see your gynecologist annually for cervical cancer screening ? ?Keeping your bones healthy is important. I recommend that you take daily calcium and Vitamin D supplements. Weight bearing exercise is also good for your bones.  ? ?I recommend that you go to the [employer] website for additional information about achieving good health.   ? ?I would like to see you back in the office in 3 to 6 months.  Please let me know if you need anything prior to that time. ?

## 2021-12-15 NOTE — Progress Notes (Signed)
?Referring Provider: Eulogio Bear, NP ?Primary Care Physician:  Eulogio Bear, NP ? ? ?Chief complaint: Crohn's disease ? ? ?IMPRESSION:  ?Fistulizing Crohns Disease ?   -Diagnosed in 2012 when she presented with abdominal pain, bloody stools, and a 60 pound weight loss ?   - Colon biopsies 2013 showed normal random biopsies except for focal erosion at the IC valve ?      - Procedure performed at [facility], procedure note not available in [place] ?      - Has been treated with Remicade, methotrexate, and Arava ?   - Off therapy for at least 3 years ?   - MRE 09/23/12:  severe inflammatory changes of the lower pelvis with multiple enteroenteric fistulas ?      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 ?Fistulizing small bowel Crohns: Recent drug levels and antibodies show good response, as is confirmed with her normal fecal calprotectin. Will add azathioprine if symptoms are not improving. Discussed risks of long term immunosuppression.  ? ?We discussed the need for close contact and follow-up. I will be unable to continue to prescribe biologic therapy if we are unable to reach her to review results when they are available.  ? ? ?PLAN: ?- Continue infliximab (Avsola) ?- Resume pantoprazole 40 mg QAM ?- Consider adding azathioprine 50 mg daily if not making progress ?- Smoking cessation recommended ?- Follow-up with Dr. Estanislado Pandy to discuss wrist and shoulder pain ?- Office follow-up in 3-6 months ? ?HPI: Deanna [name] is a 26 y.o. female with a history of JIA/psoriatic arthritis and Crohn's disease previously on MTX and Arava.  She is now followed by Dr. Humberto Seals for rheumatoid arthritis.  Returns in follow-up. She was last seen 10/31/21 after infectious colitis with  GI pathogen panel positive for C diff and Enteroaggregative E coli. At the time of her last visit I recommended adding azathioprine to infliximab because she felt the Remicade wasn't working, as her symptoms recurred 2 days after her infusions. .  ? ?Labs 11/24/2021 showing normal BMP, infliximab drug level 8.0, antiinfliximab antibodies less than 22, fecal calprotectin 87 ? ?She returns today feeling better. No abdominal pain. She is having a bowel movement most days. They are formed but very small. No blood or mucous.  ? ?Having some worsening reflux with associated dysphonia and intermittent coughing.  ? ?Complaining of wrist and shoulder pain.  ? ?She lost her job since her last visit. She had been working from home from [place].  ? ? ?IBD history: Diagnosed in 2012 presenting with abdominal pain, bloody diarrhea, 60 pound weight loss; followed at [facility] until 2015; MRE 11/22/18 showed A 7.8 cm segment of thickened and enhancing terminal ileum extends to the cecum, and likely represents moderately inflamed active Crohn's disease, radiologist unable to exclude fistula; no flares during pregnancy x 2  ?Surgical history: None  ?Current medications: None ?Prior medications: Enbrel +MTX combination (2012)-Then lost follow up until 2013; infliximab (last infusion 08/2013); 2015: MTX (25 mg po weekly), Arava 20 mg daily (noncompliance taking medications);  ?Switched from MTX to SSZ in 08/2014. Her symptoms were well controlled on MTX and Arava.  She remembers the Remicade being the most helpful treatment.   ?Last colonoscopy: 07/2020: normal colon, distal TI ulcers consistent with Crohn's ?Extraintestinal manifestations: JIA (Dr. Estanislado Pandy) ?Other medical history: None ?  Vaccination history: Unknown ? ? ? ?Past Medical History:  ?Diagnosis Date  ? Anxiety   ? Arthritis, 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[place])   ? Crohn disease ([place])   ? dx in 2012  ? Depression   ? Hemorrhoid   ? Infection   ? UTI x many  ? ? ?Past Surgical History:   ?Procedure Laterality Date  ? COLONOSCOPY    ? COLONOSCOPY  2014  ? [place]  ? TONSILLECTOMY    ? ? ?Current Outpatient Medications  ?Medication Sig Dispense Refill  ? acetaminophen (TYLENOL) 500 MG tablet Take 1,000 mg by mouth every 6 (six) hours as needed for mild pain.    ? etonogestrel (NEXPLANON) 68 MG IMPL implant 1 each (68 mg total) by Subdermal route once for 1 dose. December 2020 1 each 0  ? inFLIXimab-axxq (AVSOLA) 100 MG injection Inject 600 mg into the vein every 8 (eight) weeks. 1 each 5  ? Melatonin 10 MG TABS Take 10 tablets by mouth daily as needed (sleep).    ? ondansetron (ZOFRAN-ODT) 4 MG disintegrating tablet Take 1 tablet (4 mg total) by mouth every 8 (eight) hours as needed for nausea or vomiting. 20 tablet 0  ? PROAIR HFA 108 (90 Base) MCG/ACT inhaler INHALE 2 PUFFS INTO THE LUNGS EVERY 4 HOURS AS NEEDED FOR WHEEZING OR SHORTNESS OF BREATH. 8.5 each 3  ? sertraline (ZOLOFT) 100 MG tablet Take 100 mg by mouth daily.    ? ?No current facility-administered medications for this visit.  ? ? ?Allergies as of 12/15/2021 - Review Complete 12/15/2021  ?Allergen Reaction Noted  ? Vancomycin Dermatitis 11/05/2021  ? ? ? ?Filed Weights  ? 12/15/21 1325  ?Weight: 279 lb (126.6 kg)  ?  ?Physical Exam: ?Vital signs were reviewed. ?General:   Alert, well-nourished, pleasant and cooperative in NAD ?Head:  Normocephalic and atraumatic. ?Eyes:  Sclera clear, no icterus.   Conjunctiva pink. ?Abdomen: Soft, central obesity, mild epigastric tender, normal bowel sounds. No rebound or guarding. No hepatosplenomegaly ?Msk:  Symmetrical without gross deformities. ?Extremities:  No gross deformities or edema. ?Neurologic:  Alert and  oriented x4;  grossly nonfocal ?Skin:  No rash or bruise. ?Psych:  Alert and cooperative. Normal mood and affect. ? ? ?Demari Gales L. Tarri Glenn, MD, MPH ?[facility] ?12/15/2021, 1:40 PM ? ? ? ? ?

## 2022-01-16 ENCOUNTER — Other Ambulatory Visit: Payer: Self-pay | Admitting: Nurse Practitioner

## 2022-01-19 NOTE — Telephone Encounter (Signed)
Requested medication (s) are due for refill today: Yes ? ?Requested medication (s) are on the active medication list: Yes ? ?Last refill:  12/15/21 ? ?Future visit scheduled: No ? ?Notes to clinic:  Historical provider. ? ? ? ?Requested Prescriptions  ?Pending Prescriptions Disp Refills  ? sertraline (ZOLOFT) 100 MG tablet [Pharmacy Med Name: SERTRALINE HCL 100 MG TABLET] 180 tablet 1  ?  Sig: TAKE 2 TABLETS BY MOUTH AT BEDTIME.  ?  ? Not Delegated - Psychiatry:  Antidepressants - SSRI - sertraline Failed - 01/16/2022 11:07 PM  ?  ?  Failed - This refill cannot be delegated  ?  ?  Failed - Completed PHQ-2 or PHQ-9 in the last 360 days  ?  ?  Passed - AST in normal range and within 360 days  ?  AST  ?Date Value Ref Range Status  ?10/20/2021 8 0 - 37 U/L Final  ?  ?  ?  ?  Passed - ALT in normal range and within 360 days  ?  ALT  ?Date Value Ref Range Status  ?10/20/2021 7 0 - 35 U/L Final  ?  ?  ?  ?  Passed - Valid encounter within last 6 months  ?  Recent Outpatient Visits   ? ?      ? 3 months ago Bacterial sinusitis  ? [place], NP  ? 5 months ago Upper respiratory tract infection, unspecified type  ? [place], Jessica A, NP  ? 9 months ago Depression with anxiety  ? [place], NP  ? 9 months ago Erroneous encounter - disregard  ? [place], NP  ? 11 months ago Acute bacterial sinusitis  ? [place] Eulogio Bear, NP  ? ?  ?  ?Future Appointments   ? ?        ? In 1 month [facility]  ? ?  ? ? ?  ?  ?  ? ?

## 2022-02-03 ENCOUNTER — Telehealth: Payer: Self-pay | Admitting: Gastroenterology

## 2022-02-06 NOTE — Progress Notes (Unsigned)
Office Visit Note  Patient: Deanna [name]             Date of Birth: 10/12/1995           MRN: [id number]             PCP: Pcp, No Referring: Eulogio Bear, NP Visit Date: 02/20/2022 Occupation: _0 @  Subjective:  Pain in multiple joints   History of Present Illness: Deanna [name] is a 26 y.o. female with history of polyarthalgia, myofascial pain, and crohn's.  She is currently on infliximab (avsola) infusions every 8 weeks for management of Crohn's.  She remains under the care of Dr. Tarri Glenn.  She states that her Crohn's disease is currently well controlled but she has been experiencing increased joint pain.  She states her pain has been most severe in her lower back especially in her right SI joint.  She is also trapezius muscle tension and tenderness bilaterally. She states most of her pain is throughout the day but she has occasional pain in both shoulders at night.  She has been taking Tylenol and melatonin at bedtime to help her sleep at night.  She denies any joint swelling.  She denies any achilles tendonitis or plantar fasciitis.  She states her skin feels itchy but she has not noticed a rash.  She denies any new products and has been unable to identify a trigger.  She thought she may be allergic to her dog and got rid of her dog but the itchy has continued.  She has not yet seen a dermatologist.     Activities of Daily Living:  Patient reports morning stiffness for a few hours.   Patient Denies nocturnal pain.  Difficulty dressing/grooming: Denies Difficulty climbing stairs: Reports Difficulty getting out of chair: Reports Difficulty using hands for taps, buttons, cutlery, and/or writing: Reports  Review of Systems  Constitutional:  Positive for fatigue.  HENT:  Positive for mouth dryness. Negative for mouth sores and nose dryness.   Eyes:  Negative for pain, itching and dryness.  Respiratory:  Negative for shortness of breath and difficulty breathing.    Cardiovascular:  Negative for chest pain and palpitations.  Gastrointestinal:  Negative for blood in stool, constipation and diarrhea.  Endocrine: Negative for increased urination.  Genitourinary:  Negative for difficulty urinating.  Musculoskeletal:  Positive for joint pain, joint pain, myalgias, morning stiffness, muscle tenderness and myalgias. Negative for joint swelling.  Skin:  Positive for color change and rash.  Allergic/Immunologic: Negative for susceptible to infections.  Neurological:  Positive for numbness and headaches. Negative for dizziness, memory loss and weakness.  Hematological:  Positive for bruising/bleeding tendency.  Psychiatric/Behavioral:  Negative for confusion.    PMFS History:  Patient Active Problem List   Diagnosis Date Noted   Long COVID 01/29/2021   Preterm premature rupture of membranes (PPROM) with unknown onset of labor 09/11/2019   Status post delivery at term 09/11/2019   Obesity in pregnancy 09/06/2019   H/O rapid labor 09/06/2019   IUGR (intrauterine growth restriction) affecting care of mother 06/25/2019   HGSIL (high grade squamous intraepithelial lesion) on Pap smear of cervix 04/12/2019   Supervision of high risk pregnancy, antepartum 02/06/2019   Depression with anxiety 10/27/2018   Exposure to STD 07/07/2018   Keloid 09/12/2013   Acne vulgaris 09/12/2013   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[place]) 09/30/2012   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    Past Medical History:  Diagnosis  Date   Anxiety    Arthritis, 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[place])    Crohn disease ([place])    dx in 2012   Depression    Hemorrhoid    Infection    UTI x many    Family History  Problem Relation Age of Onset   Diabetes Maternal Grandmother    Gallstones Mother    Anxiety disorder Mother    Depression Mother    Schizophrenia Father    Eczema Father    Stomach cancer Maternal Aunt    Colon cancer Maternal Aunt    Diabetes  Maternal Grandfather    Heart disease Maternal Grandfather    Stroke Maternal Grandfather    Colon cancer Maternal Grandfather    Healthy Son    Healthy Son    Colon polyps Neg Hx    Esophageal cancer Neg Hx    Rectal cancer Neg Hx    Past Surgical History:  Procedure Laterality Date   COLONOSCOPY     COLONOSCOPY  2014   [place]   TONSILLECTOMY     Social History   Social History Narrative   Not on file   Immunization History  Administered Date(s) Administered   DTaP 10/25/1996, 12/25/1996, 02/23/1997, 01/22/1998, 09/30/2001   HPV 9-valent 07/17/2015   HPV Quadrivalent 09/12/2013   Hepatitis A, Ped/Adol-2 Dose 09/12/2013, 07/17/2015   Hepatitis B August [birth year], 1996, 10/25/1996, 05/25/1997   HiB (PRP-OMP) 10/25/1996, 12/25/1996, 02/23/1997, 01/22/1998   IPV 10/25/1996, 12/25/1996, 08/27/1997, 09/30/2001   Influenza,inj,Quad PF,6+ Mos 09/12/2013, 07/17/2015, 06/11/2016, 07/12/2019   MMR 08/27/1997, 09/30/2001   Meningococcal Conjugate 09/12/2013   PFIZER(Purple Top)SARS-COV-2 Vaccination 12/30/2019, 01/23/2020   PPD Test 06/22/2014   Tdap 04/02/2008, 09/01/2016, 07/12/2019   Varicella 08/27/1997, 04/02/2008     Objective: Vital Signs: BP 113/81 (BP Location: Left Arm, Patient Position: Sitting, Cuff Size: Large)   Pulse 97   Ht 5' 7" (1.702 m)   Wt 298 lb 12.8 oz (135.5 kg)   BMI 46.80 kg/m    Physical Exam Vitals and nursing note reviewed.  Constitutional:      Appearance: She is well-developed.  HENT:     Head: Normocephalic and atraumatic.  Eyes:     Conjunctiva/sclera: Conjunctivae normal.  Cardiovascular:     Rate and Rhythm: Normal rate and regular rhythm.     Heart sounds: Normal heart sounds.  Pulmonary:     Effort: Pulmonary effort is normal.     Breath sounds: Normal breath sounds.  Abdominal:     General: Bowel sounds are normal.     Palpations: Abdomen is soft.  Musculoskeletal:     Cervical back: Normal range of motion.  Skin:    General: Skin is  warm and dry.     Capillary Refill: Capillary refill takes less than 2 seconds.  Neurological:     Mental Status: She is alert and oriented to person, place, and time.  Psychiatric:        Behavior: Behavior normal.     Musculoskeletal Exam: C-spine has good ROM.  Trapezius muscle tension and tenderness bilaterally.  No midline spinal tenderness in the thoracic region.  Tenderness over the right SI joint and some midline spinal tenderness in the lumbar region.  Shoulder joints have good ROM.  Mild flexion contractures of both elbows.  Wrist joints, MCPs, PIPs, and DIPs good ROM with no synovitis.  Complete fist formation bilaterally.  Hip joints have good ROM with no groin pain.  Knee joints have good ROM with no warmth or effusion.  Ankle joints have good ROM with no tenderness or joint swelling.  No evidence of achilles tendonitis or plantar fasciitis.   CDAI Exam: CDAI Score: -- Patient Global: --; Provider Global: -- Swollen: --; Tender: -- Joint Exam 02/20/2022   No joint exam has been documented for this visit   There is currently no information documented on the homunculus. Go to the Rheumatology activity and complete the homunculus joint exam.  Investigation: No additional findings.  Imaging: No results found.  Recent Labs: Lab Results  Component Value Date   WBC 11.9 (H) 10/20/2021   HGB 13.8 10/20/2021   PLT 259.0 10/20/2021   NA 136 11/24/2021   K 4.3 11/24/2021   CL 104 11/24/2021   CO2 25 11/24/2021   GLUCOSE 85 11/24/2021   BUN 15 11/24/2021   CREATININE 0.73 11/24/2021   BILITOT 0.4 10/20/2021   ALKPHOS 55 10/20/2021   AST 8 10/20/2021   ALT 7 10/20/2021   PROT 7.3 10/20/2021   ALBUMIN 3.7 10/20/2021   CALCIUM 8.7 11/24/2021   GFRAA 114 10/20/2019   QFTBGOLDPLUS NEGATIVE 10/20/2019    Speciality Comments: No specialty comments available.  Procedures:  Trigger Point Inj  Date/Time: 02/20/2022 9:59 AM Performed by: Ofilia Neas, PA-C Authorized  by: Ofilia Neas, PA-C   Consent Given by:  Patient Site marked: the procedure site was marked   Timeout: prior to procedure the correct patient, procedure, and site was verified   Indications:  Pain Total # of Trigger Points:  2 Location: neck   Needle Size:  27 G Approach:  Dorsal Medications #1:  0.5 mL lidocaine 1 %; 10 mg triamcinolone acetonide 40 MG/ML Medications #2:  0.5 mL lidocaine 1 %; 10 mg triamcinolone acetonide 40 MG/ML Patient tolerance:  Patient tolerated the procedure well with no immediate complications Allergies: Vancomycin    Derm referral Assessment / Plan:     Visit Diagnoses: 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  Diagnosed with JIA (psoriatic/Crohn's related)-previously followed by [facility] Diagnosed with Crohn's disease in 2012 (bx proven)  Enbrel +MTX combination (2012)-Then lost follow up until 2013 2015: MTX (25 mg po weekly), Arava 20 mg daily (noncompliance taking medications)  Switched from MTX to SSZ in 08/2014  Currently on Avsola prescribed by Dr. Tarri Glenn.  Reviewed office visit note for 12/15/21.  Dr. Tarri Glenn advised to continue infliximab Enrique Sack) as prescribed.   She has no synovitis or dactylitis on examination today.  She has been experiencing severe pain in her lower back especially in her SI joints.  On examination today she has tenderness palpation over both SI joints right greater than left.  She has been experiencing nocturnal pain as well as morning stiffness lasting several hours.  She has been taking Tylenol as needed for symptomatic relief but was tearful during examination today due to the severity of pain she has been experiencing as well as due to losing her job since she is unable to perform her responsibilities.  She has tried taking Tylenol with minimal relief.  She has not a good candidate for NSAID use due to history of Crohn's disease. X-rays of the pelvis and lumbar spine were obtained today.  An MRI of the sacroiliac  joints will be ordered to rule out sacroiliitis.  We will discuss results at her follow-up visit.  She will follow-up in the office in 1 month or sooner if needed.  Contracture of joint of both elbows - She has history of juvenile idiopathic arthritis.  She  has contractures in her bilateral elbows.  Unchanged.  No inflammation noted on exam.  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  Followed by Dr. Tarri Glenn.  Treated with IV Remicade from 2012 -2014.  She is currently on avsola infusions every 8 weeks.  Office visit note from 12/15/2021 with Dr. Ferdinand Lango was reviewed today in the office.  Her symptoms have been well controlled on the current treatment regimen.  High risk medication use -Avsola 600 mg IV infusions every 8 weeks prescribed by Dr. Tarri Glenn. Previous therapy includes Enbrel, Arava, methotrexate, and sulfasalazine.  Chronic midline low back pain without sciatica - X-ray of the lumbar spine were unremarkable on 10/24/2019.  She presents today with increased discomfort in her lower back.  Her symptoms have been severe at times.  According to the patient she lost her job in January due to being unable to receive her final responsibilities due to severity of pain.  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  She presents today with tenderness to palpation over bilateral SI joints, right > left.  X-rays of the pelvis were obtained today. A MRI of the sacroiliac joints will be obtained to rule out sacroiliitis.   Pain in thoracic spine - The x-ray of the thoracic spine were unremarkable on 10/24/19.  She has no midline spinal tenderness.  Trapezius muscle tension and tenderness bilaterally.  Trapezius muscle spasm -She presents today with trapezius muscle tension and tenderness  bilaterally.  Has been experiencing muscle spasms intermittently.  Different treatment options were discussed today.  After informed consent bilateral trigger point injections were performed today.  Discussed the use of Salonpas patches as well as Biofreeze.  She can also use a heating pad as needed.  Plan: Trigger Point Inj  Myofascial pain: She continues to experience myalgias and muscle tenderness consistent with myofascial pain.  She presents today with trapezius muscle tension tenderness bilaterally.  She is experiencing muscle spasms intermittently.  Bilateral trapezius trigger point injections were performed today.  She tolerated the procedures well.  Procedure notes were completed above.  Aftercare was discussed. She may benefit from switching from Zoloft to Cymbalta in the future.  We can further discuss at her follow-up visit.  Other fatigue: Chronic   Skin irritation: She has been experiencing diffuse itching on her skin.  She states that she has not seen any rashes but has significant itching on her chest and upper back.  A referral to dermatology will be placed today for further evaluation.  Other medical conditions are listed as follows:   Depression with anxiety  Acne vulgaris  HGSIL (high grade squamous intraepithelial lesion) on Pap smear of cervix  Family history of psoriasis - her father.  Smoker - She smokes 2 packs/day.     Orders: Orders Placed This Encounter  Procedures   Trigger Point Inj   XR Pelvis 1-2 Views   XR Lumbar Spine 2-3 Views   No orders of the defined types were placed in this encounter.     Follow-Up Instructions: Return in 4 weeks (on 03/20/2022) for Polyarthalgia, Myofascial pain, Crohn's.   Ofilia Neas, PA-C  Note - This record has been created using Dragon software.  Chart creation errors have been sought, but may not always  have been located. Such creation errors  do not reflect on  the standard of medical care.

## 2022-02-20 ENCOUNTER — Ambulatory Visit (INDEPENDENT_AMBULATORY_CARE_PROVIDER_SITE_OTHER): Payer: BC Managed Care – PPO

## 2022-02-20 ENCOUNTER — Encounter: Payer: Self-pay | Admitting: Physician Assistant

## 2022-02-20 ENCOUNTER — Ambulatory Visit (INDEPENDENT_AMBULATORY_CARE_PROVIDER_SITE_OTHER): Payer: BC Managed Care – PPO | Admitting: Physician Assistant

## 2022-02-20 VITALS — BP 113/81 | HR 97 | Ht 67.0 in | Wt 298.8 lb

## 2022-02-20 DIAGNOSIS — Z7689 Persons encountering health services in other specified circumstances: Secondary | ICD-10-CM

## 2022-02-20 DIAGNOSIS — M546 Pain in thoracic spine: Secondary | ICD-10-CM | POA: Diagnosis not present

## 2022-02-20 DIAGNOSIS — M533 Sacrococcygeal disorders, not elsewhere classified: Secondary | ICD-10-CM

## 2022-02-20 DIAGNOSIS — M62838 Other muscle spasm: Secondary | ICD-10-CM

## 2022-02-20 DIAGNOSIS — G8929 Other chronic pain: Secondary | ICD-10-CM

## 2022-02-20 DIAGNOSIS — M24521 Contracture, right elbow: Secondary | ICD-10-CM

## 2022-02-20 DIAGNOSIS — F418 Other specified anxiety disorders: Secondary | ICD-10-CM

## 2022-02-20 DIAGNOSIS — Z79899 Other long term (current) drug therapy: Secondary | ICD-10-CM

## 2022-02-20 DIAGNOSIS — F17200 Nicotine dependence, unspecified, uncomplicated: Secondary | ICD-10-CM

## 2022-02-20 DIAGNOSIS — R87613 High grade squamous intraepithelial lesion on cytologic smear of cervix (HGSIL): Secondary | ICD-10-CM

## 2022-02-20 DIAGNOSIS — R5383 Other fatigue: Secondary | ICD-10-CM

## 2022-02-20 DIAGNOSIS — M0880 Other juvenile arthritis, unspecified site: Secondary | ICD-10-CM

## 2022-02-20 DIAGNOSIS — M545 Low back pain, unspecified: Secondary | ICD-10-CM | POA: Diagnosis not present

## 2022-02-20 DIAGNOSIS — K50813 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: Secondary | ICD-10-CM

## 2022-02-20 DIAGNOSIS — M24522 Contracture, left elbow: Secondary | ICD-10-CM

## 2022-02-20 DIAGNOSIS — R238 Other skin changes: Secondary | ICD-10-CM

## 2022-02-20 DIAGNOSIS — M7918 Myalgia, other site: Secondary | ICD-10-CM

## 2022-02-20 DIAGNOSIS — Z840 Family history of diseases of the skin and subcutaneous tissue: Secondary | ICD-10-CM

## 2022-02-20 DIAGNOSIS — L700 Acne vulgaris: Secondary | ICD-10-CM

## 2022-02-20 MED ORDER — LIDOCAINE HCL 1 % IJ SOLN
0.5000 mL | INTRAMUSCULAR | Status: AC | PRN
  Administered 2022-02-20: .5 mL

## 2022-02-20 MED ORDER — TRIAMCINOLONE ACETONIDE 40 MG/ML IJ SUSP
10.0000 mg | INTRAMUSCULAR | Status: AC | PRN
  Administered 2022-02-20: 10 mg via INTRAMUSCULAR

## 2022-02-20 NOTE — Addendum Note (Signed)
Addended by: Earnestine Mealing on: 02/20/2022 01:33 PM   Modules accepted: Orders

## 2022-02-20 NOTE — Progress Notes (Signed)
X-rays of lumbar spine and pelvis are unremarkable per Dr. Arlean Hopping interpretation.  Results were reviewed with the patient.  MRI of sacroiliac joints will be ordered for further evaluation.

## 2022-02-25 ENCOUNTER — Telehealth: Payer: Self-pay | Admitting: *Deleted

## 2022-02-25 NOTE — Telephone Encounter (Signed)
Patient has a history of Crohn's disease with a history of juvenile arthritis.  Currently on Avsola with inadequate control of joint pain.  She has been experiencing bilateral SI joint pain including nocturnal pain and morning stiffness.  Previous x-rays in 2021 revealed bilateral SI joint sclerosis.  A MRI is necessary to rule out sacroiliitis.        Previous Messages   ----- Message -----  From: Shona Needles, RT  Sent: 02/20/2022   5:04 PM EDT  To: Ofilia Neas, PA-C  Subject: MRI DENIED                                     MRI denied, please call [phone] ref [phone] for peer-to-peer, requesting [place] before MRI

## 2022-03-20 ENCOUNTER — Ambulatory Visit: Payer: BC Managed Care – PPO | Admitting: Gastroenterology

## 2022-03-25 ENCOUNTER — Ambulatory Visit: Payer: BC Managed Care – PPO | Admitting: Physician Assistant

## 2022-03-25 ENCOUNTER — Telehealth: Payer: Self-pay | Admitting: Rheumatology

## 2022-03-25 NOTE — Telephone Encounter (Signed)
Ivin Booty left message for patient to advise regarding MRI.

## 2022-03-25 NOTE — Telephone Encounter (Signed)
Patient came into office for follow up appointment. Per Deanna [name], appointment not needed if patient did not have MRI done. Patient states she did not have MRI done and would like to be advised on where to have it done if authorization has been approved.

## 2022-04-03 ENCOUNTER — Telehealth: Payer: Self-pay | Admitting: Rheumatology

## 2022-04-03 NOTE — Telephone Encounter (Signed)
Morey Hummingbird from the [facility] called the office stating the MRI authorization for the patients primary insurance expired and the secondary does not have an [place] yet. She requests a new auth for both primary and secondary insurances.

## 2022-04-07 ENCOUNTER — Ambulatory Visit (HOSPITAL_COMMUNITY): Admission: RE | Admit: 2022-04-07 | Payer: BC Managed Care – PPO | Source: Ambulatory Visit

## 2022-04-07 ENCOUNTER — Encounter (HOSPITAL_COMMUNITY): Payer: Self-pay

## 2022-04-08 NOTE — Telephone Encounter (Signed)
LMOM, reapplied for MRI, notes faxed, pending review, [place] insurance does not need authorization, LMOM for patient.

## 2022-05-06 ENCOUNTER — Ambulatory Visit (HOSPITAL_COMMUNITY): Payer: BC Managed Care – PPO

## 2022-05-18 ENCOUNTER — Encounter (HOSPITAL_COMMUNITY): Payer: Self-pay

## 2022-05-18 ENCOUNTER — Emergency Department (HOSPITAL_COMMUNITY)
Admission: EM | Admit: 2022-05-18 | Discharge: 2022-05-18 | Disposition: A | Discharge status: Home / Self Care | Payer: BC Managed Care – PPO | Attending: Emergency Medicine | Admitting: Emergency Medicine

## 2022-05-18 ENCOUNTER — Emergency Department (HOSPITAL_COMMUNITY): Payer: BC Managed Care – PPO

## 2022-05-18 DIAGNOSIS — R519 Headache, unspecified: Secondary | ICD-10-CM | POA: Diagnosis not present

## 2022-05-18 DIAGNOSIS — Y92410 Unspecified street and highway as the place of occurrence of the external cause: Secondary | ICD-10-CM | POA: Diagnosis not present

## 2022-05-18 DIAGNOSIS — S6992XA Unspecified injury of left wrist, hand and finger(s), initial encounter: Secondary | ICD-10-CM | POA: Diagnosis present

## 2022-05-18 DIAGNOSIS — S60512A Abrasion of left hand, initial encounter: Secondary | ICD-10-CM | POA: Insufficient documentation

## 2022-05-18 DIAGNOSIS — Z23 Encounter for immunization: Secondary | ICD-10-CM | POA: Insufficient documentation

## 2022-05-18 DIAGNOSIS — M542 Cervicalgia: Secondary | ICD-10-CM | POA: Insufficient documentation

## 2022-05-18 MED ORDER — ONDANSETRON 8 MG PO TBDP
8.0000 mg | ORAL_TABLET | Freq: Once | ORAL | Status: AC
Start: 2022-05-18 — End: 2022-05-18
  Filled 2022-05-18: qty 1

## 2022-05-18 MED ORDER — ONDANSETRON 8 MG PO TBDP
ORAL_TABLET | ORAL | Status: AC
  Administered 2022-05-18: 8 mg via ORAL
  Filled 2022-05-18: qty 1

## 2022-05-18 MED ORDER — TETANUS-DIPHTH-ACELL PERTUSSIS 5-2.5-18.5 LF-MCG/0.5 IM SUSY
0.5000 mL | PREFILLED_SYRINGE | Freq: Once | INTRAMUSCULAR | Status: AC
Start: 2022-05-18 — End: 2022-05-18
  Administered 2022-05-18: 0.5 mL via INTRAMUSCULAR
  Filled 2022-05-18: qty 0.5
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
## 2022-09-09 ENCOUNTER — Telehealth: Payer: BC Managed Care – PPO | Admitting: Family Medicine

## 2022-09-09 NOTE — Progress Notes (Signed)
The patient no-showed for appointment despite this provider sending direct link, reaching out via phone with no response and waiting for at least 10 minutes from appointment time for patient to join. They will be marked as a NS for this appointment/time.   Perlie Mayo, NP

## 2022-10-14 ENCOUNTER — Telehealth: Payer: Self-pay | Admitting: Gastroenterology

## 2022-10-14 NOTE — Telephone Encounter (Signed)
[facility] calling to inquire about the plan of treatment for this patient. State they are treating her with Avsola but have yet to receive a prescription renewal and patient is scheduled for next week. Call back phone number is  [phone]. Please advise.

## 2022-10-14 NOTE — Telephone Encounter (Signed)
Attempted to contact pt to schedule follow up appt. Left message for pt to call back.

## 2022-10-15 NOTE — Telephone Encounter (Signed)
Left message for pt to call back.

## 2022-10-16 NOTE — Telephone Encounter (Signed)
Received call from [facility] stating that pt has an appointment coming up and they need a prescription renewal. Let [facility] know that once pt is scheduled for a follow up visit we can renew pt's prescription. [facility] said they would contact pt as well and leave a voicemail if pt didn't answer.

## 2022-10-19 NOTE — Telephone Encounter (Signed)
Left voicemail for pt to call back.

## 2022-10-20 NOTE — Telephone Encounter (Signed)
PT is calling back about infusion call back requested

## 2022-10-21 NOTE — Telephone Encounter (Signed)
Pt scheduled for follow up on 11/25/22 at 10:50 am. Called [facility] and they are going to fax prescription renewal request.

## 2022-10-22 ENCOUNTER — Ambulatory Visit: Payer: BC Managed Care – PPO

## 2022-10-22 ENCOUNTER — Ambulatory Visit
Admission: RE | Admit: 2022-10-22 | Discharge: 2022-10-22 | Disposition: A | Discharge status: Home / Self Care | Payer: 59 | Source: Ambulatory Visit | Attending: Internal Medicine | Admitting: Internal Medicine

## 2022-10-22 VITALS — BP 131/67 | HR 88 | Temp 98.4°F | Resp 18

## 2022-10-22 DIAGNOSIS — R197 Diarrhea, unspecified: Secondary | ICD-10-CM | POA: Diagnosis not present

## 2022-10-22 DIAGNOSIS — K50918 Crohn's disease, unspecified, with other complication: Secondary | ICD-10-CM

## 2022-10-22 DIAGNOSIS — R112 Nausea with vomiting, unspecified: Secondary | ICD-10-CM | POA: Diagnosis not present

## 2022-10-22 MED ORDER — METRONIDAZOLE 500 MG PO TABS: 500.0000 mg | ORAL_TABLET | Freq: Three times a day (TID) | ORAL | 0 refills | Status: AC

## 2022-10-22 MED ORDER — CIPROFLOXACIN HCL 500 MG PO TABS: 500.0000 mg | ORAL_TABLET | Freq: Two times a day (BID) | ORAL | 0 refills | Status: DC

## 2022-10-22 NOTE — ED Triage Notes (Signed)
Patient was unable to make her apt for her infusion of her crones medication that she gets every 8 weeks. She is currently having nausea, vomiting and diarrhea and was wondering if there was some mediation that could help her til her next appointment to get her infusion. Taking tylenol.

## 2022-10-22 NOTE — Discharge Instructions (Addendum)
Please start taking stomach probiotic.  Ensure high-fiber diet.  Ensure adequate fluids.  I have prescribed 2 antibiotics.  Blood work is pending.  Will call if it is abnormal.  Try to follow-up with GI doctor sooner.

## 2022-10-22 NOTE — ED Provider Notes (Addendum)
[facility]    CSN: [id number] Arrival date & time: 10/22/22  1503      History   Chief Complaint Chief Complaint  Patient presents with   Diarrhea    I think I'm having flare ups from my chrons disease I missed my last dose of infusion medicine and I'm experiencing symptoms of neasous and cramping - Entered by patient   Nausea    HPI ROSSELYN [name] is a 27 y.o. female.   Patient presents with concern for Crohn's flareup.  She states that over the past week she has been having nausea, vomiting, diarrhea.  She denies blood in stool or emesis or any associated fever or chills.  Reports left upper quadrant abdominal pain that she describes as a cramping sensation.  Patient reports that stools are loose but she has been having a sensation that she is not fully emptying out the stool.  Reports the symptoms are consistent with typical Crohn's flare.  Patient states that she typically gets Avsola infusions every 8 weeks but was not able to get recent injection in January given she missed the appointment.  She states that she is not able to go for an additional infusion at this time until she sees GI doctor which is scheduled for March 6.  She is inquiring about medication to help alleviate current flare and to get some relief until GI doctor appointment.  Patient denies concern for pregnancy.  Patient has previously been on Remicade, methotrexate, Arava with no improvement of her Crohn's disease.  She also currently takes a PPI.   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[place])    Crohn disease ([place])    dx in 2012   Depression    Hemorrhoid    Infection    UTI x many    Patient Active Problem List   Diagnosis Date Noted   Long COVID 01/29/2021   Preterm premature rupture of membranes (PPROM) with unknown onset of labor 09/11/2019   Status post delivery at term 09/11/2019   Obesity in pregnancy 09/06/2019   H/O  rapid labor 09/06/2019   IUGR (intrauterine growth restriction) affecting care of mother 06/25/2019   HGSIL (high grade squamous intraepithelial lesion) on Pap smear of cervix 04/12/2019   Supervision of high risk pregnancy, antepartum 02/06/2019   Depression with anxiety 10/27/2018   Exposure to STD 07/07/2018   Keloid 09/12/2013   Acne vulgaris 09/12/2013   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 Authorizing Provider  acetaminophen (TYLENOL) 500 MG tablet Take 1,000 mg by mouth every 6 (six) hours as needed for mild pain.   Yes [provider]  ciprofloxacin (CIPRO) 500 MG tablet Take 1 tablet (500 mg total) by mouth every 12 (twelve) hours. 10/22/22  Yes , Hildred Alamin E, FNP  Melatonin 10 MG TABS Take 10 tablets by mouth daily as needed (sleep).   Yes [provider]  metroNIDAZOLE (FLAGYL) 500 MG tablet Take 1 tablet (500 mg total) by mouth every 8 (eight) hours for 5 days. 10/22/22 10/27/22 Yes , Hildred Alamin E, FNP  PROAIR HFA 108 [phone] Base) MCG/ACT inhaler INHALE 2 PUFFS INTO THE LUNGS EVERY 4 HOURS AS NEEDED FOR WHEEZING OR SHORTNESS OF BREATH. 07/28/21  Yes Noemi Chapel A, NP  sertraline (ZOLOFT) 100 MG tablet Take 100 mg by mouth daily.   Yes [provider]  etonogestrel (NEXPLANON) 68 MG IMPL implant 1 each (68 mg total) by Subdermal route once for 1 dose. December 2020 10/18/19   [place], Modena Nunnery, MD  inFLIXimab-axxq (AVSOLA) 100 MG injection Inject 600 mg into the vein  every 8 (eight) weeks. 10/09/21   Levin Erp, PA  ondansetron (ZOFRAN-ODT) 4 MG disintegrating tablet Take 1 tablet (4 mg total) by mouth every 8 (eight) hours as needed for nausea or vomiting. 10/14/21   Brunetta Jeans, PA-C    Family History Family History  Problem Relation Age of Onset   Diabetes Maternal Grandmother    Gallstones Mother    Anxiety disorder Mother    Depression Mother    Schizophrenia Father    Eczema Father    Stomach cancer Maternal Aunt    Colon cancer Maternal Aunt    Diabetes Maternal Grandfather    Heart disease Maternal Grandfather    Stroke Maternal Grandfather    Colon cancer Maternal Grandfather    Healthy Son    Healthy Son    Colon polyps Neg Hx    Esophageal cancer Neg Hx    Rectal cancer Neg Hx     Social History Social History   Tobacco Use   Smoking status: Every Day    Types: Cigarettes    Passive exposure: Current   Smokeless tobacco: Never   Tobacco comments:    vaping in high school  Vaping Use   Vaping Use: Former  Substance Use Topics   Alcohol use: No   Drug use: No     Allergies   Vancomycin   Review of Systems Review of Systems Per HPI  Physical Exam Triage Vital Signs ED Triage Vitals  Enc Vitals Group     BP 10/22/22 1521 131/67     Pulse Rate 10/22/22 1521 88     Resp 10/22/22 1521 18     Temp 10/22/22 1521 98.4 F (36.9 C)     Temp Source 10/22/22 1521 Oral     SpO2 10/22/22 1521 98 %     Weight --      Height --      Head Circumference --      Peak Flow --      Pain Score 10/22/22 1522 6     Pain Loc --      Pain Edu? --      Excl. in [place]? --    No data found.  Updated Vital Signs BP 131/67 (BP Location: Right Arm)   Pulse 88   Temp 98.4 F (36.9 C) (Oral)   Resp 18   LMP 10/15/2022 (Exact Date)   SpO2 98%   Visual Acuity Right Eye Distance:   Left Eye Distance:   Bilateral Distance:    Right Eye Near:  Left Eye Near:    Bilateral Near:     Physical  Exam Constitutional:      General: She is not in acute distress.    Appearance: Normal appearance. She is not toxic-appearing or diaphoretic.  HENT:     Head: Normocephalic and atraumatic.  Eyes:     Extraocular Movements: Extraocular movements intact.     Conjunctiva/sclera: Conjunctivae normal.  Cardiovascular:     Rate and Rhythm: Normal rate and regular rhythm.     Pulses: Normal pulses.     Heart sounds: Normal heart sounds.  Pulmonary:     Effort: Pulmonary effort is normal. No respiratory distress.     Breath sounds: Normal breath sounds.  Abdominal:     General: Bowel sounds are normal. There is no distension.     Palpations: Abdomen is soft.     Tenderness: There is abdominal tenderness in the left upper quadrant.     Comments: Patient has mild tenderness to palpation to left upper quadrant.  Neurological:     General: No focal deficit present.     Mental Status: She is alert and oriented to person, place, and time. Mental status is at baseline.  Psychiatric:        Mood and Affect: Mood normal.        Behavior: Behavior normal.        Thought Content: Thought content normal.        Judgment: Judgment normal.      UC Treatments / Results  Labs (all labs ordered are listed, but only abnormal results are displayed) Labs Reviewed  CBC  COMPREHENSIVE METABOLIC PANEL  SEDIMENTATION RATE  C-REACTIVE PROTEIN    EKG   Radiology No results found.  Procedures Procedures (including critical care time)  Medications Ordered in UC Medications - No data to display  Initial Impression / Assessment and Plan / UC Course  I have reviewed the triage vital signs and the nursing notes.  Pertinent labs & imaging results that were available during my care of the patient were reviewed by me and considered in my medical decision making (see chart for details).     Patient is presenting for concern for Crohn's flareup.  Patient has been experiencing nausea, vomiting,  diarrhea for approximately 1 week but does not have any blood in stool.  There are also no signs of acute abdomen or dehydration on exam so given these reasons and that associated vital signs are stable with no fever, do not think that emergent evaluation is necessary.  Will obtain CMP, CBC, ESR, CRP.  Patient has a history of fistulizing Crohn's disease so there is concern for infection/abscess.  Therefore, will treat with Cipro and Flagyl.  Patient was advised to take these medications with food and to avoid strenuous physical activity.  Also advised patient to avoid alcohol.  Patient does have history of C. difficile but I do think that benefits outweigh risks given patient has history of fistulas and there is concern for abscess.  Will await blood work for any further workup.  Patient may need steroids if inflammatory markers are elevated.  Patient was advised to take probiotics as well as increasing water intake and high-fiber diet.  Patient was advised to attempt to follow-up with GI doctor sooner for further evaluation and management.  She was also given strict return and ER precautions.  Patient verbalized understanding and was agreeable with plan.  Discussed clinical course with supervising physician Dr. Leonides Grills. Final Clinical Impressions(s) /  UC Diagnoses   Final diagnoses:  Nausea vomiting and diarrhea  Crohn's disease with other complication, unspecified gastrointestinal tract location [facility])     Discharge Instructions      Please start taking stomach probiotic.  Ensure high-fiber diet.  Ensure adequate fluids.  I have prescribed 2 antibiotics.  Blood work is pending.  Will call if it is abnormal.  Try to follow-up with GI doctor sooner.    ED Prescriptions     Medication Sig Dispense Auth. Provider   ciprofloxacin (CIPRO) 500 MG tablet Take 1 tablet (500 mg total) by mouth every 12 (twelve) hours. 10 tablet [place], [place] E, [place]   metroNIDAZOLE (FLAGYL) 500 MG tablet Take 1 tablet (500  mg total) by mouth every 8 (eight) hours for 5 days. 14 tablet [place], Michele Rockers, [place]      PDMP not reviewed this encounter.   Teodora Medici, [place] 10/22/22 1623    [address] [place] 10/22/22 [phone]

## 2022-10-23 LAB — CBC
Hematocrit: 42.7 % (ref 34.0–46.6)
Hemoglobin: 14 g/dL (ref 11.1–15.9)
MCH: 30.2 pg (ref 26.6–33.0)
MCHC: 32.8 g/dL (ref 31.5–35.7)
MCV: 92 fL (ref 79–97)
Platelets: 253 10*3/uL (ref 150–450)
RBC: 4.63 x10E6/uL (ref 3.77–5.28)
RDW: 13.1 % (ref 11.7–15.4)
WBC: 7.9 10*3/uL (ref 3.4–10.8)

## 2022-10-23 LAB — COMPREHENSIVE METABOLIC PANEL
ALT: 8 IU/L (ref 0–32)
AST: 10 IU/L (ref 0–40)
Albumin/Globulin Ratio: 1.4 (ref 1.2–2.2)
Albumin: 4.1 g/dL (ref 4.0–5.0)
Alkaline Phosphatase: 68 IU/L (ref 44–121)
BUN/Creatinine Ratio: 15 (ref 9–23)
BUN: 12 mg/dL (ref 6–20)
Bilirubin Total: 0.3 mg/dL (ref 0.0–1.2)
CO2: 21 mmol/L (ref 20–29)
Calcium: 8.9 mg/dL (ref 8.7–10.2)
Chloride: 104 mmol/L (ref 96–106)
Creatinine, Ser: 0.8 mg/dL (ref 0.57–1.00)
Globulin, Total: 3 g/dL (ref 1.5–4.5)
Glucose: 105 mg/dL — ABNORMAL HIGH (ref 70–99)
Potassium: 3.9 mmol/L (ref 3.5–5.2)
Sodium: 138 mmol/L (ref 134–144)
Total Protein: 7.1 g/dL (ref 6.0–8.5)
eGFR: 104 mL/min/{1.73_m2} (ref >59)

## 2022-10-23 LAB — C-REACTIVE PROTEIN: CRP: 7 mg/L (ref 0–10)

## 2022-10-23 LAB — SEDIMENTATION RATE: Sed Rate: 20 mm/hr (ref 0–32)

## 2022-10-26 NOTE — Telephone Encounter (Signed)
Received prescription renewal and faxed signed form back to [facility].

## 2022-10-30 NOTE — Telephone Encounter (Signed)
Inbound call from [facility] requesting patient last ov notes for renewal of Avsola.   Fax number [phone]  Please advise

## 2022-10-30 NOTE — Telephone Encounter (Signed)
Office note faxed as requested.

## 2022-11-09 ENCOUNTER — Ambulatory Visit
Admission: RE | Admit: 2022-11-09 | Discharge: 2022-11-09 | Disposition: A | Discharge status: Home / Self Care | Payer: 59 | Source: Ambulatory Visit | Attending: Internal Medicine | Admitting: Internal Medicine

## 2022-11-09 VITALS — BP 148/93 | HR 80 | Temp 98.0°F | Resp 16

## 2022-11-09 DIAGNOSIS — U071 COVID-19: Secondary | ICD-10-CM

## 2022-11-09 MED ORDER — BENZONATATE 100 MG PO CAPS: 100.0000 mg | ORAL_CAPSULE | Freq: Three times a day (TID) | ORAL | 0 refills | Status: DC | PRN

## 2022-11-09 MED ORDER — NIRMATRELVIR/RITONAVIR (PAXLOVID)TABLET: 3.0000 | ORAL_TABLET | Freq: Two times a day (BID) | ORAL | 0 refills | Status: DC

## 2022-11-09 NOTE — ED Provider Notes (Signed)
[facility]    CSN: [id number] Arrival date & time: 11/09/22  1327      History   Chief Complaint Chief Complaint  Patient presents with   Cough    Took at [facility] test came back positive Chills , stuff nose , sneezing , coughing , sore body , headache, sore throat - Entered by patient    HPI EMYIAH DAMUTH is a 27 y.o. female.   Patient presents for further evaluation after testing positive for COVID-19 with an at home test.  Patient reports headache, sore throat, nasal congestion, nasal drainage, cough, fatigue that started about 3 to 4 days ago.  Patient denies any known fevers or sick contacts.  Denies chest pain, shortness of breath, nausea, vomiting, diarrhea, abdominal pain.  Patient has taken over-the-counter cold and flu medications with minimal improvement of symptoms.  Denies history of asthma.  Patient reports COVID test that was positive at home expired in 2023 so she wants repeat testing.   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[place])    Crohn disease ([place])    dx in 2012   Depression    Hemorrhoid    Infection    UTI x many    Patient Active Problem List   Diagnosis Date Noted   Long COVID 01/29/2021   Preterm premature rupture of membranes (PPROM) with unknown onset of labor 09/11/2019   Status post delivery at term 09/11/2019   Obesity in pregnancy 09/06/2019   H/O rapid labor 09/06/2019   IUGR (intrauterine growth restriction) affecting care of mother 06/25/2019   HGSIL (high grade squamous intraepithelial lesion) on Pap smear of cervix 04/12/2019   Supervision of high risk pregnancy, antepartum 02/06/2019   Depression with anxiety 10/27/2018   Exposure to STD 07/07/2018   Keloid 09/12/2013   Acne vulgaris 09/12/2013   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 Authorizing Provider  benzonatate (TESSALON) 100 MG capsule Take 1 capsule (100 mg total) by mouth every 8 (eight) hours as needed for cough. 11/09/22  Yes , Michele Rockers, FNP  nirmatrelvir/ritonavir (PAXLOVID) 20 x 150 MG & 10 x 100MG TABS Take 3 tablets by mouth 2 (two) times daily for 5 days. Patient GFR is 104. Take nirmatrelvir (150 mg) two tablets twice daily for 5 days and ritonavir (100 mg) one tablet twice daily for 5 days. 11/09/22 11/14/22 Yes , Michele Rockers, FNP  acetaminophen (TYLENOL) 500 MG tablet Take 1,000 mg by mouth every 6 (six) hours as needed for mild pain.    [provider]  ciprofloxacin (CIPRO) 500 MG tablet Take 1 tablet (500 mg total) by mouth every  12 (twelve) hours. 10/22/22   Teodora Medici, FNP  etonogestrel (NEXPLANON) 68 MG IMPL implant 1 each (68 mg total) by Subdermal route once for 1 dose. December 2020 10/18/19   [place], Modena Nunnery, MD  inFLIXimab-axxq (AVSOLA) 100 MG injection Inject 600 mg into the vein every 8 (eight) weeks. 10/09/21   Levin Erp, PA  Melatonin 10 MG TABS Take 10 tablets by mouth daily as needed (sleep).    [provider]  ondansetron (ZOFRAN-ODT) 4 MG disintegrating tablet Take 1 tablet (4 mg total) by mouth every 8 (eight) hours as needed for nausea or vomiting. 10/14/21   Brunetta Jeans, PA-C  PROAIR HFA 108 [phone] Base) MCG/ACT inhaler INHALE 2 PUFFS INTO THE LUNGS EVERY 4 HOURS AS NEEDED FOR WHEEZING OR SHORTNESS OF BREATH. 07/28/21   Eulogio Bear, NP  sertraline (ZOLOFT) 100 MG tablet Take 100 mg by mouth daily.    [provider]    Family History Family History  Problem Relation Age of Onset   Diabetes Maternal Grandmother    Gallstones Mother    Anxiety disorder Mother    Depression Mother    Schizophrenia Father    Eczema Father    Stomach cancer Maternal Aunt    Colon cancer Maternal Aunt    Diabetes Maternal Grandfather    Heart disease Maternal Grandfather    Stroke Maternal Grandfather    Colon cancer Maternal Grandfather    Healthy Son    Healthy Son    Colon polyps Neg Hx    Esophageal cancer Neg Hx    Rectal cancer Neg Hx     Social History Social History   Tobacco Use   Smoking status: Every Day    Types: Cigarettes    Passive exposure: Current   Smokeless tobacco: Never   Tobacco comments:    vaping in high school  Vaping Use   Vaping Use: Former  Substance Use Topics   Alcohol use: No   Drug use: No     Allergies   Amoxil [amoxicillin] and Vancomycin   Review of Systems Review of Systems Per HPI  Physical Exam Triage Vital Signs ED Triage Vitals [11/09/22 1416]  Enc Vitals Group     BP (!) 148/93     Pulse Rate 96     Resp 16     Temp 98 F (36.7 C)     Temp Source Oral     SpO2 97 %     Weight      Height      Head Circumference      Peak Flow      Pain Score 6     Pain Loc      Pain Edu?      Excl. in [place]?    No data found.  Updated Vital Signs BP (!) 148/93 (BP Location: Right Arm)   Pulse 80   Temp 98 F (36.7 C) (Oral)   Resp 16   LMP 10/15/2022 (Exact Date)   SpO2 97%   Visual Acuity Right Eye Distance:   Left Eye Distance:   Bilateral Distance:    Right Eye Near:   Left Eye Near:    Bilateral Near:     Physical Exam Constitutional:      General: She is not in acute distress.    Appearance: Normal appearance. She is not toxic-appearing or diaphoretic.  HENT:     Head: Normocephalic and atraumatic.     Right  Ear: Tympanic membrane and ear canal normal.     Left Ear: Tympanic membrane and ear canal normal.     Nose:  Congestion present.     Mouth/Throat:     Mouth: Mucous membranes are moist.     Pharynx: No posterior oropharyngeal erythema.  Eyes:     Extraocular Movements: Extraocular movements intact.     Conjunctiva/sclera: Conjunctivae normal.     Pupils: Pupils are equal, round, and reactive to light.  Cardiovascular:     Rate and Rhythm: Normal rate and regular rhythm.     Pulses: Normal pulses.     Heart sounds: Normal heart sounds.  Pulmonary:     Effort: Pulmonary effort is normal. No respiratory distress.     Breath sounds: Normal breath sounds. No stridor. No wheezing, rhonchi or rales.  Abdominal:     General: Abdomen is flat. Bowel sounds are normal.     Palpations: Abdomen is soft.  Musculoskeletal:        General: Normal range of motion.     Cervical back: Normal range of motion.  Skin:    General: Skin is warm and dry.  Neurological:     General: No focal deficit present.     Mental Status: She is alert and oriented to person, place, and time. Mental status is at baseline.  Psychiatric:        Mood and Affect: Mood normal.        Behavior: Behavior normal.      UC Treatments / Results  Labs (all labs ordered are listed, but only abnormal results are displayed) Labs Reviewed  SARS CORONAVIRUS 2 (TAT 6-24 HRS)    EKG   Radiology No results found.  Procedures Procedures (including critical care time)  Medications Ordered in UC Medications - No data to display  Initial Impression / Assessment and Plan / UC Course  I have reviewed the triage vital signs and the nursing notes.  Pertinent labs & imaging results that were available during my care of the patient were reviewed by me and considered in my medical decision making (see chart for details).     Patient tested positive for Covid 19 with an at home test.  Low suspicion for false positive so will treat for COVID 19 with COVID antivirals.  Had discussion with patient about COVID antivirals and she wishes to  be treated with COVID antivirals.  Also prescribed patient cough medication.  Last GFR was 104 so Paxlovid should be safe as patient also does not take any other daily medications per her report.  Discussed with patient possible adverse effects of COVID antivirals and she voiced understanding.  Advised strict return precautions.  Advised supportive care.  Advised quarantine.  Patient verbalized understanding and was agreeable with plan. Final Clinical Impressions(s) / UC Diagnoses   Final diagnoses:  [id number]     Discharge Instructions      I am treating you with COVID antiviral and cough medication.  Please use a backup form of birth control for at least 30 days after taking Paxlovid.    ED Prescriptions     Medication Sig Dispense Auth. Provider   nirmatrelvir/ritonavir (PAXLOVID) 20 x 150 MG & 10 x 100MG TABS Take 3 tablets by mouth 2 (two) times daily for 5 days. Patient GFR is 104. Take nirmatrelvir (150 mg) two tablets twice daily for 5 days and ritonavir (100 mg) one tablet twice daily for 5 days. 30 tablet Kemp, Michele Rockers, [place]  benzonatate (TESSALON) 100 MG capsule Take 1 capsule (100 mg total) by mouth every 8 (eight) hours as needed for cough. 21 capsule [place], Michele Rockers, [place]      PDMP not reviewed this encounter.   Teodora Medici, [place] 11/09/22 1515

## 2022-11-09 NOTE — ED Triage Notes (Signed)
Pt c/o headache, stomach ache, sore throat, nasal drainage, sneezing, cough, fatigue   Onset ~ Friday   Tested covid(+) at home last night

## 2022-11-09 NOTE — Discharge Instructions (Signed)
I am treating you with COVID antiviral and cough medication.  Please use a backup form of birth control for at least 30 days after taking Paxlovid.

## 2022-11-10 LAB — SARS CORONAVIRUS 2 (TAT 6-24 HRS): SARS Coronavirus 2: POSITIVE — AB

## 2022-11-10 NOTE — Telephone Encounter (Signed)
Attempted to reach patient. Her vm is full and not accepting any new messages at this time. MyChart message sent to patient.

## 2022-11-10 NOTE — Telephone Encounter (Signed)
[facility] called stating that they had been trying to get ahold of patient to see if she was going to continue her care and has been unable to reach her. Requesting that we try and reach out to see if we get ahold of her to find out. Please advise.

## 2022-11-12 ENCOUNTER — Telehealth: Payer: 59 | Admitting: Physician Assistant

## 2022-11-12 DIAGNOSIS — U071 COVID-19: Secondary | ICD-10-CM

## 2022-11-12 DIAGNOSIS — B999 Unspecified infectious disease: Secondary | ICD-10-CM

## 2022-11-12 MED ORDER — DOXYCYCLINE HYCLATE 100 MG PO TABS: 100.0000 mg | ORAL_TABLET | Freq: Two times a day (BID) | ORAL | 0 refills | Status: DC

## 2022-11-12 MED ORDER — PROMETHAZINE-DM 6.25-15 MG/5ML PO SYRP: 5.0000 mL | ORAL_SOLUTION | Freq: Four times a day (QID) | ORAL | 0 refills | Status: DC | PRN

## 2022-11-12 NOTE — Patient Instructions (Addendum)
Florentina Addison, thank you for joining Leeanne Rio, PA-C for today's virtual visit.  While this provider is not your primary care provider (PCP), if your PCP is located in our provider database this encounter information will be shared with them immediately following your visit.   [place] account gives you access to today's visit and all your visits, tests, and labs performed at [facility] " click here if you don't have a [facility] account or go to [email or web address]  Consent: (Patient) Deanna [name] provided verbal consent for this virtual visit at the beginning of the encounter.  Current Medications:  Current Outpatient Medications:    acetaminophen (TYLENOL) 500 MG tablet, Take 1,000 mg by mouth every 6 (six) hours as needed for mild pain., Disp: , Rfl:    benzonatate (TESSALON) 100 MG capsule, Take 1 capsule (100 mg total) by mouth every 8 (eight) hours as needed for cough., Disp: 21 capsule, Rfl: 0   ciprofloxacin (CIPRO) 500 MG tablet, Take 1 tablet (500 mg total) by mouth every 12 (twelve) hours., Disp: 10 tablet, Rfl: 0   etonogestrel (NEXPLANON) 68 MG IMPL implant, 1 each (68 mg total) by Subdermal route once for 1 dose. December 2020, Disp: 1 each, Rfl: 0   inFLIXimab-axxq (AVSOLA) 100 MG injection, Inject 600 mg into the vein every 8 (eight) weeks., Disp: 1 each, Rfl: 5   Melatonin 10 MG TABS, Take 10 tablets by mouth daily as needed (sleep)., Disp: , Rfl:    nirmatrelvir/ritonavir (PAXLOVID) 20 x 150 MG & 10 x 100MG TABS, Take 3 tablets by mouth 2 (two) times daily for 5 days. Patient GFR is 104. Take nirmatrelvir (150 mg) two tablets twice daily for 5 days and ritonavir (100 mg) one tablet twice daily for 5 days., Disp: 30 tablet, Rfl: 0   ondansetron (ZOFRAN-ODT) 4 MG disintegrating tablet, Take 1 tablet (4 mg total) by mouth every 8 (eight) hours as needed for nausea or vomiting., Disp: 20 tablet, Rfl: 0   PROAIR HFA 108  (90 Base) MCG/ACT inhaler, INHALE 2 PUFFS INTO THE LUNGS EVERY 4 HOURS AS NEEDED FOR WHEEZING OR SHORTNESS OF BREATH., Disp: 8.5 each, Rfl: 3   sertraline (ZOLOFT) 100 MG tablet, Take 100 mg by mouth daily., Disp: , Rfl:    Medications ordered in this encounter:  No orders of the defined types were placed in this encounter.    *If you need refills on other medications prior to your next appointment, please contact your pharmacy*  Follow-Up: Call back or seek an in-person evaluation if the symptoms worsen or if the condition fails to improve as anticipated.  [place] [phone]  Other Instructions Please stop the Paxlovid. Increase fluids and rest.  Ok to use Mucinex OTC to help thin congestion. Also starting a saline nasal rinse for nasal congestion can be helpful.  Take the Doxycycline as directed Use the cough medication as directed.  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  Still want you to mask until symptoms are resolving to be cautious.    If you have been instructed to have an in-person evaluation today at a local Urgent Care facility, please use the link below. It will take you to a list of all of our available [facility], including address, phone number and hours of operation. Please do not delay care.   Urgent Cares  If you or  a family member do not have a primary care provider, use the link below to schedule a visit and establish care. When you choose a [facility] primary care physician or advanced practice provider, you gain a long-term partner in health. Find a Primary Care Provider  Learn more about [facility]'s in-office and virtual care options: [facility]

## 2022-11-12 NOTE — Progress Notes (Signed)
Virtual Visit Consent   RAGENE INGRIM, you are scheduled for a virtual visit with a [facility] provider today. Just as with appointments in the office, your consent must be obtained to participate. Your consent will be active for this visit and any virtual visit you may have with one of our providers in the next 365 days. If you have a MyChart account, a copy of this consent can be sent to you electronically.  As this is a virtual visit, video technology does not allow for your provider to perform a traditional examination. This may limit your provider's ability to fully assess your condition. If your provider identifies any concerns that need to be evaluated in person or the need to arrange testing (such as labs, EKG, etc.), we will make arrangements to do so. Although advances in technology are sophisticated, we cannot ensure that it will always work on either your end or our end. If the connection with a video visit is poor, the visit may have to be switched to a telephone visit. With either a video or telephone visit, we are not always able to ensure that we have a secure connection.  By engaging in this virtual visit, you consent to the provision of healthcare and authorize for your insurance to be billed (if applicable) for the services provided during this visit. Depending on your insurance coverage, you may receive a charge related to this service.  I need to obtain your verbal consent now. Are you willing to proceed with your visit today? Deanna [name] has provided verbal consent on 11/12/2022 for a virtual visit (video or telephone). Leeanne Rio, [place]  Date: 11/12/2022 2:04 PM  Virtual Visit via Video Note   I, Leeanne Rio, connected with  Deanna [name]  ([id number], 1996/05/23) on 11/12/22 at  1:30 PM EST by a video-enabled telemedicine application and verified that I am speaking with the correct person using two identifiers.  Location: Patient: Virtual Visit  Location Patient: Home Provider: Virtual Visit Location Provider: Home Office   I discussed the limitations of evaluation and management by telemedicine and the availability of in person appointments. The patient expressed understanding and agreed to proceed.    History of Present Illness: Deanna [name] is a 27 y.o. who identifies as a female who was assigned female at birth, and is being seen today for some lingering symptoms after COVID diagnosis and concerns about side effect from antiviral. Was evaluated at [facility] on 2/19 with 3 days of COVID symptoms at time of UC evaluation. Worsening the Monday of her UC evaluation. Tested positive for COVID-19. Was started on Paxlovid which she has taken as directed. Noting change in taste -- metallic -- nausea and headache with antiviral. Notes initial COVID symptoms resolving but still with sinus pressure and now sinus pain. Denies fever, chills or aches.    HPI: HPI  Problems:  Patient Active Problem List   Diagnosis Date Noted   Long COVID 01/29/2021   Preterm premature rupture of membranes (PPROM) with unknown onset of labor 09/11/2019   Status post delivery at term 09/11/2019   Obesity in pregnancy 09/06/2019   H/O rapid labor 09/06/2019   IUGR (intrauterine growth restriction) affecting care of mother 06/25/2019   HGSIL (high grade squamous intraepithelial lesion) on Pap smear of cervix 04/12/2019   Supervision of high risk pregnancy, antepartum 02/06/2019   Depression with anxiety 10/27/2018   Exposure to STD 07/07/2018   Keloid 09/12/2013   Acne vulgaris  09/12/2013   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[place]) 09/30/2012   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    Allergies:  Allergies  Allergen Reactions   Amoxil [Amoxicillin] Anaphylaxis   Vancomycin Dermatitis   Medications:  Current Outpatient Medications:    doxycycline (VIBRA-TABS) 100 MG tablet, Take 1 tablet (100 mg total) by mouth 2 (two)  times daily., Disp: 20 tablet, Rfl: 0   promethazine-dextromethorphan (PROMETHAZINE-DM) 6.25-15 MG/5ML syrup, Take 5 mLs by mouth 4 (four) times daily as needed for cough., Disp: 118 mL, Rfl: 0   acetaminophen (TYLENOL) 500 MG tablet, Take 1,000 mg by mouth every 6 (six) hours as needed for mild pain., Disp: , Rfl:    benzonatate (TESSALON) 100 MG capsule, Take 1 capsule (100 mg total) by mouth every 8 (eight) hours as needed for cough., Disp: 21 capsule, Rfl: 0   etonogestrel (NEXPLANON) 68 MG IMPL implant, 1 each (68 mg total) by Subdermal route once for 1 dose. December 2020, Disp: 1 each, Rfl: 0   inFLIXimab-axxq (AVSOLA) 100 MG injection, Inject 600 mg into the vein every 8 (eight) weeks., Disp: 1 each, Rfl: 5   Melatonin 10 MG TABS, Take 10 tablets by mouth daily as needed (sleep)., Disp: , Rfl:    ondansetron (ZOFRAN-ODT) 4 MG disintegrating tablet, Take 1 tablet (4 mg total) by mouth every 8 (eight) hours as needed for nausea or vomiting., Disp: 20 tablet, Rfl: 0   PROAIR HFA 108 (90 Base) MCG/ACT inhaler, INHALE 2 PUFFS INTO THE LUNGS EVERY 4 HOURS AS NEEDED FOR WHEEZING OR SHORTNESS OF BREATH., Disp: 8.5 each, Rfl: 3   sertraline (ZOLOFT) 100 MG tablet, Take 100 mg by mouth daily., Disp: , Rfl:   Observations/Objective: Patient is well-developed, well-nourished in no acute distress.  Resting comfortably at home.  Head is normocephalic, atraumatic.  No labored breathing. Speech is clear and coherent with logical content.  Patient is alert and oriented at baseline.   Assessment and Plan: 1. COVID-19 - doxycycline (VIBRA-TABS) 100 MG tablet; Take 1 tablet (100 mg total) by mouth 2 (two) times daily.  Dispense: 20 tablet; Refill: 0 - promethazine-dextromethorphan (PROMETHAZINE-DM) 6.25-15 MG/5ML syrup; Take 5 mLs by mouth 4 (four) times daily as needed for cough.  Dispense: 118 mL; Refill: 0  2. Superimposed infection  Stop antiviral. Supportive measures and OTC medications reviewed.  Doxycycline per orders for secondary sinusitis. Promethazine-DM as directed.   Follow Up Instructions: I discussed the assessment and treatment plan with the patient. The patient was provided an opportunity to ask questions and all were answered. The patient agreed with the plan and demonstrated an understanding of the instructions.  A copy of instructions were sent to the patient via MyChart unless otherwise noted below.   The patient was advised to call back or seek an in-person evaluation if the symptoms worsen or if the condition fails to improve as anticipated.  Time:  I spent 10 minutes with the patient via telehealth technology discussing the above problems/concerns.    Leeanne Rio, PA-C

## 2022-11-25 ENCOUNTER — Encounter: Payer: Self-pay | Admitting: Gastroenterology

## 2022-11-25 ENCOUNTER — Other Ambulatory Visit: Payer: 59

## 2022-11-25 ENCOUNTER — Ambulatory Visit (INDEPENDENT_AMBULATORY_CARE_PROVIDER_SITE_OTHER): Payer: 59 | Admitting: Gastroenterology

## 2022-11-25 VITALS — BP 124/76 | HR 114 | Ht 67.0 in | Wt 301.0 lb

## 2022-11-25 DIAGNOSIS — K50919 Crohn's disease, unspecified, with unspecified complications: Secondary | ICD-10-CM | POA: Diagnosis not present

## 2022-11-25 DIAGNOSIS — R1032 Left lower quadrant pain: Secondary | ICD-10-CM | POA: Diagnosis not present

## 2022-11-25 NOTE — Patient Instructions (Signed)
Your provider has requested that you go to the basement level for lab work before leaving today. Press "B" on the elevator. The lab is located at the first door on the left as you exit the elevator.  You have been scheduled for a CT scan of the abdomen and pelvis at [facility], 1st floor Radiology. You are scheduled on Thursday 12/10/22 at 11:30 am. You should arrive 15 minutes prior to your appointment time for registration.    Please follow the written instructions below on the day of your exam:   1) Do not eat anything after 9:30 am (4 hours prior to your test)   You may take any medications as prescribed with a small amount of water, if necessary. If you take any of the following medications: METFORMIN, GLUCOPHAGE, GLUCOVANCE, AVANDAMET, RIOMET, FORTAMET, [place] MET, JANUMET, GLUMETZA or METAGLIP, you MAY be asked to HOLD this medication 48 hours AFTER the exam.   The purpose of you drinking the oral contrast is to aid in the visualization of your intestinal tract. The contrast solution may cause some diarrhea. Depending on your individual set of symptoms, you may also receive an intravenous injection of x-ray contrast/dye. Plan on being at [facility] for 45 minutes or longer, depending on the type of exam you are having performed.   If you have any questions regarding your exam or if you need to reschedule, you may call [facility] at [phone] between the hours of 8:00 am and 5:00 pm, Monday-Friday.
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
## 2022-12-10 ENCOUNTER — Ambulatory Visit (HOSPITAL_COMMUNITY): Payer: 59

## 2022-12-11 ENCOUNTER — Ambulatory Visit (HOSPITAL_COMMUNITY): Payer: 59

## 2022-12-16 ENCOUNTER — Telehealth: Payer: 59 | Admitting: Nurse Practitioner

## 2022-12-16 DIAGNOSIS — R112 Nausea with vomiting, unspecified: Secondary | ICD-10-CM

## 2022-12-16 MED ORDER — ONDANSETRON HCL 4 MG PO TABS: 4.0000 mg | ORAL_TABLET | Freq: Three times a day (TID) | ORAL | 0 refills | Status: DC | PRN

## 2022-12-16 NOTE — Progress Notes (Signed)
E-Visit for Nausea and Vomiting   We are sorry that you are not feeling well. Here is how we plan to help!  Based on what you have shared with me it looks like you have a Virus that is irritating your GI tract.  Vomiting is the forceful emptying of a portion of the stomach's content through the mouth.  Although nausea and vomiting can make you feel miserable, it's important to remember that these are not diseases, but rather symptoms of an underlying illness.  When we treat short term symptoms, we always caution that any symptoms that persist should be fully evaluated in a medical office.  I have prescribed a medication that will help alleviate your symptoms and allow you to stay hydrated:  Zofran 4 mg 1 tablet every 8 hours as needed for nausea and vomiting  HOME CARE: Drink clear liquids.  This is very important! Dehydration (the lack of fluid) can lead to a serious complication.  Start off with 1 tablespoon every 5 minutes for 8 hours. You may begin eating bland foods after 8 hours without vomiting.  Start with saltine crackers, white bread, rice, mashed potatoes, applesauce. After 48 hours on a bland diet, you may resume a normal diet. Try to go to sleep.  Sleep often empties the stomach and relieves the need to vomit.  GET HELP RIGHT AWAY IF:  Your symptoms do not improve or worsen within 2 days after treatment. You have a fever for over 3 days. You cannot keep down fluids after trying the medication.  MAKE SURE YOU:  Understand these instructions. Will watch your condition. Will get help right away if you are not doing well or get worse.    Thank you for choosing an e-visit.  Your e-visit answers were reviewed by a board certified advanced clinical practitioner to complete your personal care plan. Depending upon the condition, your plan could have included both over the counter or prescription medications.  Please review your pharmacy choice. Make sure the pharmacy is open so  you can pick up prescription now. If there is a problem, you may contact your provider through MyChart messaging and have the prescription routed to another pharmacy.  Your safety is important to us. If you have drug allergies check your prescription carefully.   For the next 24 hours you can use MyChart to ask questions about today's visit, request a non-urgent call back, or ask for a work or school excuse. You will get an email in the next two days asking about your experience. I hope that your e-visit has been valuable and will speed your recovery.   Meds ordered this encounter  Medications   ondansetron (ZOFRAN) 4 MG tablet    Sig: Take 1 tablet (4 mg total) by mouth every 8 (eight) hours as needed for nausea or vomiting.    Dispense:  20 tablet    Refill:  0    I spent approximately 5 minutes reviewing the patient's history, current symptoms and coordinating their care today.   

## 2022-12-17 ENCOUNTER — Telehealth: Payer: 59 | Admitting: Physician Assistant

## 2022-12-17 DIAGNOSIS — R300 Dysuria: Secondary | ICD-10-CM

## 2022-12-17 DIAGNOSIS — Z3200 Encounter for pregnancy test, result unknown: Secondary | ICD-10-CM

## 2022-12-17 NOTE — Progress Notes (Signed)
Because of possible pregnancy which can lead to a more complicated UTI, I feel your condition warrants further evaluation and I recommend that you be seen in a face to face visit This way you can get pregnancy testing, an a urinalysis/culture.    NOTE: There will be NO CHARGE for this eVisit   If you are having a true medical emergency please call 911.      For an urgent face to face visit, [facility] has eight urgent care centers for your convenience:   NEW!! [place] [facility] [place] [facility] [place] Get Driving Directions [id number] [address] [place], [place] [facility] [place] [facility] Get Driving Directions [id number] [place], [place] [address]   [place] [facility]) Get Driving Directions [id number] [address], [place] [address]  [place] [facility] [place] ([place]) Get Driving Directions [id number] [address] [place],  [place]  [address]  [place] [facility] [place] [facility] - at [place] Get Driving Directions  [id number] [phone] [address] [place],  [place] [address]   [place] [facility] Get Driving Directions [id number] [place], [place], [place] [address]   [place] [facility] Get Driving Directions  [id number] [address], [place] [address]   [place] [facility] at [place] Get Driving Directions [id number] [address] [place], [place] [address]  Your MyChart E-visit questionnaire answers were reviewed by a board certified advanced clinical practitioner to complete your personal care plan based on your specific symptoms.  Thank you for using e-Visits.

## 2023-01-06 ENCOUNTER — Ambulatory Visit (HOSPITAL_COMMUNITY)
Admission: RE | Admit: 2023-01-06 | Discharge: 2023-01-06 | Disposition: A | Discharge status: Home / Self Care | Payer: Medicaid Other | Source: Ambulatory Visit | Attending: Gastroenterology | Admitting: Gastroenterology

## 2023-01-06 DIAGNOSIS — K50919 Crohn's disease, unspecified, with unspecified complications: Secondary | ICD-10-CM | POA: Diagnosis present

## 2023-01-06 DIAGNOSIS — R1032 Left lower quadrant pain: Secondary | ICD-10-CM | POA: Insufficient documentation

## 2023-01-06 MED ORDER — SODIUM CHLORIDE (PF) 0.9 % IJ SOLN
INTRAMUSCULAR | Status: AC
  Filled 2023-01-06: qty 50

## 2023-01-06 MED ORDER — IOHEXOL 9 MG/ML PO SOLN
ORAL | Status: AC
  Filled 2023-01-06: qty 1000

## 2023-01-06 MED ORDER — IOHEXOL 300 MG/ML  SOLN
100.0000 mL | Freq: Once | INTRAMUSCULAR | Status: AC | PRN
  Administered 2023-01-06: 100 mL via INTRAVENOUS

## 2023-01-06 MED ORDER — IOHEXOL 9 MG/ML PO SOLN
1000.0000 mL | ORAL | Status: AC
  Administered 2023-01-06: 1000 mL via ORAL

## 2023-01-13 ENCOUNTER — Telehealth: Payer: Self-pay

## 2023-01-13 NOTE — Telephone Encounter (Signed)
Pt returned call. Pt has been advised of results. Will come in for stool studies.

## 2023-02-25 ENCOUNTER — Ambulatory Visit: Payer: Managed Care, Other (non HMO) | Admitting: Gastroenterology

## 2023-02-25 ENCOUNTER — Other Ambulatory Visit: Payer: Self-pay | Admitting: Gastroenterology

## 2023-02-25 ENCOUNTER — Encounter: Payer: Self-pay | Admitting: Gastroenterology

## 2023-02-25 ENCOUNTER — Other Ambulatory Visit: Payer: Medicaid Other

## 2023-02-25 VITALS — BP 104/72 | HR 92 | Ht 68.0 in | Wt 288.5 lb

## 2023-02-25 DIAGNOSIS — M0880 Other juvenile arthritis, unspecified site: Secondary | ICD-10-CM

## 2023-02-25 DIAGNOSIS — K219 Gastro-esophageal reflux disease without esophagitis: Secondary | ICD-10-CM

## 2023-02-25 DIAGNOSIS — M069 Rheumatoid arthritis, unspecified: Secondary | ICD-10-CM

## 2023-02-25 DIAGNOSIS — K50919 Crohn's disease, unspecified, with unspecified complications: Secondary | ICD-10-CM

## 2023-02-25 MED ORDER — DICYCLOMINE HCL 10 MG PO CAPS: 10.0000 mg | ORAL_CAPSULE | Freq: Three times a day (TID) | ORAL | 1 refills | Status: DC | PRN

## 2023-02-25 NOTE — Patient Instructions (Addendum)
If your blood pressure at your visit was 140/90 or greater, please contact your primary care physician to follow up on this. ______________________________________________________  If you are age 27 or older, your body mass index should be between 23-30. Your Body mass index is 43.87 kg/m. If this is out of the aforementioned range listed, please consider follow up with your Primary Care Provider.  If you are age 58 or younger, your body mass index should be between 19-25. Your Body mass index is 43.87 kg/m. If this is out of the aformentioned range listed, please consider follow up with your Primary Care Provider.  ________________________________________________________  The [facility] providers would like to encourage you to use [facility] to communicate with providers for non-urgent requests or questions.  Due to long hold times on the telephone, sending your provider a message by [facility] may be a faster and more efficient way to get a response.  Please allow 48 business hours for a response.  Please remember that this is for non-urgent requests.  _______________________________________________________  Due to recent changes in healthcare laws, you may see the results of your imaging and laboratory studies on MyChart before your provider has had a chance to review them.  We understand that in some cases there may be results that are confusing or concerning to you. Not all laboratory results come back in the same time frame and the provider may be waiting for multiple results in order to interpret others.  Please give [place] 48 hours in order for your provider to thoroughly review all the results before contacting the office for clarification of your results.   Resume Avsola  Please go to the lab in the basement of our building to have lab work done as you leave today. Hit "B" for basement when you get on the elevator.  When the doors open the lab is on your left.  We will call you with the results.  Thank you.  We have sent the following medications to your pharmacy for you to pick up at your convenience: Bentyl 10mg : Take one every 8 hours as needed  Avoid NSAIDs. Take Tylenol as needed  Please see your PCP about getting the pneumococcal 20 vaccine.  Please follow up in 6 months or sooner as needed.  Thank you for entrusting me with your care and for choosing [facility], Dr. Ileene Patrick
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
## 2023-02-27 LAB — QUANTIFERON-TB GOLD PLUS
Mitogen-NIL: >10 IU/mL
NIL: 0.05 IU/mL
QuantiFERON-TB Gold Plus: NEGATIVE
TB1-NIL: 0.01 IU/mL
TB2-NIL: 0.02 IU/mL

## 2023-03-01 ENCOUNTER — Telehealth: Payer: Self-pay | Admitting: Pharmacy Technician

## 2023-03-01 NOTE — Telephone Encounter (Addendum)
Auth Submission: NO AUTH NEEDED Site of care: Site of care: [facility] Payer: [employer]UE Medication & CPT/J Code(s)  AVSOLA - [id number] Route of submission (phone, fax, portal):  Phone # Fax # Auth type: Buy/Bill Units/visits requested:  Reference number:  Approval from:  03/18/23 - 09/21/23

## 2023-03-15 ENCOUNTER — Encounter: Payer: Self-pay | Admitting: Gastroenterology

## 2023-03-15 NOTE — Telephone Encounter (Signed)
[place], Reached out to check on status and the system is down.  I will attempt to check again before I leave and again tomorrow morning.  I will f/u once I have a response.

## 2023-03-18 ENCOUNTER — Telehealth: Payer: Self-pay | Admitting: Pharmacy Technician

## 2023-03-18 ENCOUNTER — Encounter: Payer: Self-pay | Admitting: Gastroenterology

## 2023-03-18 NOTE — Telephone Encounter (Signed)
Dr. Adela Lank, [facility] note:  Patient will be scheduled as soon as possible  Auth Submission: NO AUTH NEEDED Site of care: Site of care: [facility] Payer: [place] [employer] BLUE Medication & CPT/J Code(s) submitted: Avsola (infliximab-axxq) [phone] Route of submission (phone, fax, portal):  Phone #[phone] Fax # Auth type: Buy/Bill Units/visits requested:5mg /kg 700mg  [id number] Reference number:  1st verify: [id number] 2nd verify: [id number] Approval from: 03/18/23 to 09/21/23

## 2023-03-18 NOTE — Telephone Encounter (Signed)
Great thank you!

## 2023-03-19 NOTE — Telephone Encounter (Signed)
See 03/18/23 telephone encounter from [facility]. No auth needed for Avsola infusions. 1st infusion is scheduled for 04/01/23 at 10 am.

## 2023-03-31 NOTE — Telephone Encounter (Addendum)
Dr. Marina Goodell as DOD AM of 03/31/23 - please advise. Thanks  Dr. Lanetta Inch patient with Crohn's disease, Rheumatoid arthritis and GERD. She was to resume Avsola infusions, her first dose would be tomorrow. Pt is [redacted] weeks pregnant. Please advise if safe to proceed with infusion or if patient needs to cancel and await further recommendations upon Dr. Lanetta Inch return. Thanks

## 2023-03-31 NOTE — Telephone Encounter (Signed)
As she is just initiating therapy, this can wait until Dr. Adela Lank returns next week.  He needs to be aware of her pregnancy.  Thanks

## 2023-04-01 ENCOUNTER — Ambulatory Visit: Payer: Medicaid Other

## 2023-04-02 ENCOUNTER — Encounter: Payer: Self-pay | Admitting: Gastroenterology

## 2023-04-06 ENCOUNTER — Encounter: Payer: Self-pay | Admitting: Gastroenterology

## 2023-04-06 ENCOUNTER — Ambulatory Visit: Payer: Medicaid Other | Admitting: Gastroenterology

## 2023-04-06 VITALS — BP 108/76 | HR 104 | Ht 68.0 in | Wt 289.0 lb

## 2023-04-06 DIAGNOSIS — Z3A01 Less than 8 weeks gestation of pregnancy: Secondary | ICD-10-CM

## 2023-04-06 DIAGNOSIS — M069 Rheumatoid arthritis, unspecified: Secondary | ICD-10-CM | POA: Diagnosis not present

## 2023-04-06 DIAGNOSIS — K50919 Crohn's disease, unspecified, with unspecified complications: Secondary | ICD-10-CM

## 2023-04-06 DIAGNOSIS — Z79899 Other long term (current) drug therapy: Secondary | ICD-10-CM | POA: Diagnosis not present

## 2023-04-06 NOTE — Progress Notes (Signed)
HPI :  27 year old female here for follow-up visit for Crohn's disease.  She was last seen in June  IBD history: Diagnosed in 2012 presenting with abdominal pain, bloody diarrhea, 60 pound weight loss; followed at [facility] until 2015; MRE 11/22/18 showed A 7.8 cm segment of thickened and enhancing terminal ileum extends to the cecum, and likely represents moderately inflamed active Crohn's disease, radiologist unable to exclude fistula; no flares during pregnancy x 2  Surgical history: None  Current medications: None Prior medications: Enbrel +MTX combination (2012)-Then lost follow up until 2013; infliximab (last infusion 08/2013); 2015: MTX (25 mg po weekly), Arava 20 mg daily (noncompliance taking medications);  Switched from MTX to SSZ in 08/2014. Her symptoms were well controlled on MTX and Arava.  She remembers the Remicade being the most helpful treatment.   Last colonoscopy: 07/2020: normal colon, distal TI ulcers consistent with Crohn's Extraintestinal manifestations: JIA (Dr. Corliss Skains)   SINCE LAST VISIT   At her last visit was the first time I met her.  I assumed her care as she was previously followed by Dr. Orvan Falconer.  Recall she has had longstanding Crohn's disease as well as juvenile arthritis and has been on multiple regimens over the years.  She had been on Remicade for a few years and this generally had worked pretty well for her.  On methotrexate previously but stopped it.  Had transition to Avsola in the past year for insurance purposes and was doing well on this but ran out of it and January.  CT scan in April did not show any significantly active disease, we had discussed doing a fecal calprotectin but she never followed up to have it done.  Recall she had some recurrent symptoms off Avsola and we decided to resume that at our last visit.  At the time of her infusion that was scheduled to resume, she was notified that she was pregnant and she canceled the infusion to discuss  this further with me.  She is currently 6 weeks 27 pregnant with her third pregnancy.  She recalls that her first pregnancy was complicated by preterm birth.  She feels that her Crohn's is somewhat active at this time.  She has some cramping abdominal symptoms at times with some loose stools.  She has been having more nausea lately in the setting of pregnancy which is controlled with Zofran.  She has used some Bentyl as needed for abdominal pains but does not use that routinely.  She is off all tobacco.  She had TB testing since I last saw her and it was negative.   Labs 11/24/2021 showing normal BMP, infliximab drug level 8.0, antiinfliximab antibodies less than 22, fecal calprotectin 87   CT abdomen / pelvis 01/09/23: IMPRESSION: 1. No acute findings in the abdomen or pelvis. 2. Tethered loops of cecum, terminal ileum, and sigmoid colon in the right lower quadrant appear unchanged. No significant associated inflammatory changes.   Past Medical History:  Diagnosis Date   Anxiety    Arthritis, 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[name])    Crohn disease ([name])    dx in 2012   Depression    Hemorrhoid    Infection    UTI x many     Past Surgical History:  Procedure Laterality Date   COLONOSCOPY     COLONOSCOPY  2014   [facility]   TONSILLECTOMY     Family History  Problem Relation Age of Onset   Diabetes Maternal Grandmother    Gallstones Mother  Anxiety disorder Mother    Depression Mother    Schizophrenia Father    Eczema Father    Stomach cancer Maternal Aunt    Colon cancer Maternal Aunt    Diabetes Maternal Grandfather    Heart disease Maternal Grandfather    Stroke Maternal Grandfather    Colon cancer Maternal Grandfather    Healthy Son    Healthy Son    Colon polyps Neg Hx    Esophageal cancer Neg Hx    Rectal cancer Neg Hx    Social History   Tobacco Use   Smoking status: Every Day    Types: Cigarettes    Passive exposure: Current   Smokeless tobacco: Never   Tobacco comments:     vaping in high school  Vaping Use   Vaping status: Former  Substance Use Topics   Alcohol use: No   Drug use: No   Current Outpatient Medications  Medication Sig Dispense Refill   dicyclomine (BENTYL) 10 MG capsule Take 1 capsule (10 mg total) by mouth every 8 (eight) hours as needed for spasms. 60 capsule 1   escitalopram (LEXAPRO) 5 MG tablet Take 10 mg by mouth daily.     inFLIXimab-axxq (AVSOLA) 100 MG injection Inject 600 mg into the vein every 8 (eight) weeks. 1 each 5   Melatonin 10 MG TABS Take 10 tablets by mouth daily as needed (sleep).     ondansetron (ZOFRAN) 4 MG tablet Take 1 tablet (4 mg total) by mouth every 8 (eight) hours as needed for nausea or vomiting. 20 tablet 0   PROAIR HFA 108 (90 Base) MCG/ACT inhaler INHALE 2 PUFFS INTO THE LUNGS EVERY 4 HOURS AS NEEDED FOR WHEEZING OR SHORTNESS OF BREATH. 8.5 each 3   No current facility-administered medications for this visit.   Allergies  Allergen Reactions   Amoxil [Amoxicillin] Anaphylaxis   Vancomycin Dermatitis     Review of Systems: All systems reviewed and negative except where noted in HPI.   Lab Results  Component Value Date   WBC 7.9 10/22/2022   HGB 14.0 10/22/2022   HCT 42.7 10/22/2022   MCV 92 10/22/2022   PLT 253 10/22/2022    Lab Results  Component Value Date   NA 138 10/22/2022   CL 104 10/22/2022   K 3.9 10/22/2022   CO2 21 10/22/2022   BUN 12 10/22/2022   CREATININE 0.80 10/22/2022   EGFR 104 10/22/2022   CALCIUM 8.9 10/22/2022   ALBUMIN 4.1 10/22/2022   GLUCOSE 105 (H) 10/22/2022    Lab Results  Component Value Date   ALT 8 10/22/2022   AST 10 10/22/2022   ALKPHOS 68 10/22/2022   BILITOT 0.3 10/22/2022      Physical Exam: BP 108/76 (BP Location: Left Arm, Patient Position: Sitting)   Pulse (!) 104   Ht 5\' 8"  (1.727 m)   Wt 289 lb (131.1 kg)   SpO2 99%   BMI 43.94 kg/m  Constitutional: Pleasant,well-developed, female in no acute distress. Neurological: Alert and  oriented to person place and time. Psychiatric: Normal mood and affect. Behavior is normal.   ASSESSMENT: 27 y.o. female here for assessment of the following  1. Crohn's disease with complication, unspecified gastrointestinal tract location ([name])   2. Rheumatoid arthritis, involving unspecified site, unspecified whether rheumatoid factor present ([name])   3. High risk medication use   4. Less than [redacted] weeks gestation of pregnancy    As above, longstanding Crohn's disease with arthritis.  She has been  on a variety of regimens over the years but has responded quite favorably to anti-TNF.  Previously on Remicade and then transition to Avsola due to insurance purposes.  She ran out of this in January and we had decided to resume it at the last visit, she canceled it when she found out she was pregnant want to talk to me further about it.  We discussed her history of Crohn's disease.  Active Crohn's disease in pregnancy is the biggest risk factor to the fetus, can be associated with preterm birth and low birthweight.  She had active Crohn's during her first pregnancy and it was complicated by preterm birth from what she tells me today.  I discussed use of anti-TNF in pregnancy for which there is good safety data for both mom and baby.  I recommend she resume the Avsola to control her Crohn's disease during her pregnancy.  I would recommend that the last dose be given no sooner than 2 to 3 weeks prior to delivery.  Following full discussion of this she is agreeable and will contact the infusion center to reschedule this.  If she has any issues at that she will let me know.  I reviewed safety data for Bentyl with her which is limited.  I would only use this if she is having severe abdominal cramping in response to it.  Otherwise she may be better off using peppermint oils such as IBgard for abdominal cramps.  She is having nausea with her pregnancy, she can continue Zofran as needed for that.  I asked her to  talk with her primary care about the new PCV 20 vaccine which we typically recommend in the setting of anti-TNF.  I will see her in 3 to 4 months or sooner with any issues.  She agreed  PLAN: - resume Avsola as outlined above. Last dose should be more than 2-3 weeks prior to anticipated delivery. - minimize Bentyl use - can use IB gard if needed for aches/pains - Zofran PRN for nausea with pregnancy - talk with PCP about PCV 20 vaccine - f/u 4 months or sooner with issues  Harlin Rain, MD [facility]

## 2023-04-06 NOTE — Patient Instructions (Signed)
If your blood pressure at your visit was 140/90 or greater, please contact your primary care physician to follow up on this. ______________________________________________________  If you are age 27 or older, your body mass index should be between 23-30. Your Body mass index is 43.94 kg/m. If this is out of the aforementioned range listed, please consider follow up with your Primary Care Provider.  If you are age 33 or younger, your body mass index should be between 19-25. Your Body mass index is 43.94 kg/m. If this is out of the aformentioned range listed, please consider follow up with your Primary Care Provider.  ________________________________________________________  The [facility] providers would like to encourage you to use [facility] to communicate with providers for non-urgent requests or questions.  Due to long hold times on the telephone, sending your provider a message by [facility] may be a faster and more efficient way to get a response.  Please allow 48 business hours for a response.  Please remember that this is for non-urgent requests.  _______________________________________________________  Due to recent changes in healthcare laws, you may see the results of your imaging and laboratory studies on MyChart before your provider has had a chance to review them.  We understand that in some cases there may be results that are confusing or concerning to you. Not all laboratory results come back in the same time frame and the provider may be waiting for multiple results in order to interpret others.  Please give [place] 48 hours in order for your provider to thoroughly review all the results before contacting the office for clarification of your results.   Continue Avsola.  We will contact the infusion center to get you rescheduled.  Minimize Bentyl use.  We have given you samples of the following medication to take: IBgard - use as needed  Use zofran as needed.  Thank you for entrusting me  with your care and for choosing [facility], Dr. Ileene Patrick

## 2023-04-14 ENCOUNTER — Ambulatory Visit: Payer: Medicaid Other

## 2023-04-14 VITALS — BP 131/86 | HR 87

## 2023-04-14 DIAGNOSIS — Z3201 Encounter for pregnancy test, result positive: Secondary | ICD-10-CM | POA: Diagnosis not present

## 2023-04-14 LAB — POCT URINE PREGNANCY: Preg Test, Ur: POSITIVE — AB

## 2023-04-14 NOTE — Progress Notes (Signed)
Deanna [name] here for a UPT. Pt had a positive upt at home. LMP is 02/20/2023.     UPT in office Positive.    Reviewed medications and informed patient to continue taking prenatal vitamins.  Pt to follow on 04/28/2023 for New OB Intake.

## 2023-04-26 ENCOUNTER — Encounter: Payer: Self-pay | Admitting: *Deleted

## 2023-04-26 ENCOUNTER — Other Ambulatory Visit: Payer: Self-pay | Admitting: *Deleted

## 2023-04-26 ENCOUNTER — Ambulatory Visit (INDEPENDENT_AMBULATORY_CARE_PROVIDER_SITE_OTHER): Payer: Medicaid Other

## 2023-04-26 VITALS — BP 121/78 | HR 81 | Temp 98.4°F | Resp 17 | Ht 67.0 in | Wt 288.2 lb

## 2023-04-26 DIAGNOSIS — K50813 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: Secondary | ICD-10-CM | POA: Diagnosis not present

## 2023-04-26 MED ORDER — METHYLPREDNISOLONE SODIUM SUCC 40 MG IJ SOLR
40.0000 mg | Freq: Once | INTRAMUSCULAR | Status: AC
  Administered 2023-04-26: 40 mg via INTRAVENOUS
  Filled 2023-04-26: qty 1

## 2023-04-26 MED ORDER — ACETAMINOPHEN 325 MG PO TABS
650.0000 mg | ORAL_TABLET | Freq: Once | ORAL | Status: AC
  Administered 2023-04-26: 650 mg via ORAL
  Filled 2023-04-26: qty 2

## 2023-04-26 MED ORDER — SODIUM CHLORIDE 0.9 % IV SOLN
5.0000 mg/kg | Freq: Once | INTRAVENOUS | Status: AC
  Administered 2023-04-26: 700 mg via INTRAVENOUS
  Filled 2023-04-26: qty 70

## 2023-04-26 MED ORDER — DIPHENHYDRAMINE HCL 25 MG PO CAPS
25.0000 mg | ORAL_CAPSULE | Freq: Once | ORAL | Status: AC
  Administered 2023-04-26: 25 mg via ORAL
  Filled 2023-04-26: qty 1

## 2023-04-26 NOTE — Progress Notes (Signed)
Diagnosis: Crohn's Disease  Provider:  Chilton Greathouse MD  Procedure: IV Infusion  IV Type: Peripheral, IV Location: L Antecubital  Avsola (infliximab-axxq), Dose: 700mg   Infusion Start Time: 1348  Infusion Stop Time: 1608  Post Infusion IV Care: Observation period completed and Peripheral IV Discontinued  Discharge: Condition: Good, Destination: Home . AVS Declined  Performed by:  Loney Hering, LPN

## 2023-04-26 NOTE — Patient Instructions (Signed)
Infliximab Injection What is this medication? INFLIXIMAB (in FLIX i mab) treats autoimmune conditions, such as psoriasis, arthritis, Crohn's disease, and ulcerative colitis. It works by slowing down an overactive immune system. It belongs to a group of medications called TNF inhibitors. It is a monoclonal antibody. This medicine may be used for other purposes; ask your health care provider or pharmacist if you have questions. COMMON BRAND NAME(S): AVSOLA, INFLECTRA, IXIFI, Remicade, RENFLEXIS, Zymfentra What should I tell my care team before I take this medication? They need to know if you have any of these conditions: Cancer Current or past resident of [place] or [place] Diabetes Exposure to tuberculosis Guillain-Barre syndrome Heart failure Liver disease Immune system problems Infection Lung or breathing disease, such as COPD Multiple sclerosis Receiving phototherapy for the skin Seizure disorder An unusual or allergic reaction to infliximab, mouse proteins, other medications, foods, dyes, or preservatives Pregnant or trying to get pregnant Breast-feeding How should I use this medication? This medication is injected into a vein or under the skin. If it is injected into a vein, it is given by your care team in a hospital or clinic setting. If it is injected under the skin, it may be given at home. If you get this medication at home, you will be taught how to prepare and give it. Use exactly as directed. Take it as directed on the prescription label at the same time every day. Keep taking it unless your care team tells you to stop. It is important that you put your used needles and syringes in a special sharps container. Do not put them in a trash can. If you do not have a sharps container, call your pharmacist or care team to get one. A special MedGuide will be given to you by the pharmacist with each prescription and refill. Be sure to read this information carefully each  time. Talk to your care team about the use of this medication in children. While it may be prescribed for children as young as 33 years of age for selected conditions, precautions do apply. Overdosage: If you think you have taken too much of this medicine contact a poison control center or emergency room at once. NOTE: This medicine is only for you. Do not share this medicine with others. What if I miss a dose? If you get this medication at the hospital or clinic: It is important not to miss your dose. Call your care team if you are unable to keep an appointment. If you give yourself this medication at home: If you miss a dose, take it as soon as you can. Then give the next dose 2 weeks later and continue your normal schedule. If it is almost time for your next dose, take only that dose. Do not take double or extra doses. Call your care team with questions. What may interact with this medication? Do not take this medication with any of the following: Biologic medications, such as abatacept, adalimumab, anakinra, certolizumab, etanercept, golimumab, rituximab, secukinumab, tocilizumab, tofactinib, ustekinumab Live vaccines This list may not describe all possible interactions. Give your health care provider a list of all the medicines, herbs, non-prescription drugs, or dietary supplements you use. Also tell them if you smoke, drink alcohol, or use illegal drugs. Some items may interact with your medicine. What should I watch for while using this medication? Visit your care team for regular checks on your progress. Your condition will be monitored carefully while you are receiving this medication. You may  need blood work done while you are taking this medication. You will be tested for tuberculosis (TB) before you start this medication. If your care team prescribes any medication for TB, you should start taking the TB medication before starting this medication. Make sure to finish the full course of TB  medication. This medication may increase your risk of getting an infection. Call your care team for advice if you get a fever, chills, sore throat, or other symptoms of a cold or flu. Do not treat yourself. Try to avoid being around people who are sick. This medication may make the symptoms of heart failure worse in some patients. Contact your care team right away if you develop signs or symptoms of heart failure. Before having surgery or dental work, talk to your care team to make sure it is ok. This medication can increase the risk of poor healing of your surgical site or wound. If you take this medication for plaque psoriasis, stay out of the sun. If you cannot avoid being in the sun, wear protective clothing and sunscreen. Do not use sun lamps or tanning beds/booths. Talk to your care team about your risk of cancer. You may be more at risk for certain types of cancers if you take this medication. What side effects may I notice from receiving this medication? Side effects that you should report to your care team as soon as possible: Allergic reactions--skin rash, itching, hives, swelling of the face, lips, tongue, or throat Body pain, tingling, or numbness Heart attack--pain or tightness in the chest, shoulders, arms, or jaw, nausea, shortness of breath, cold or clammy skin, feeling faint or lightheaded Heart failure--shortness of breath, swelling of the ankles, feet, or hands, sudden weight gain, unusual weakness or fatigue Heart rhythm changes--fast or irregular heartbeat, dizziness, feeling faint or lightheaded, chest pain, trouble breathing Increase in blood pressure Infection--fever, chills, cough, sore throat, wounds that don't heal, pain or trouble when passing urine, general feeling of discomfort or being unwell Liver injury--right upper belly pain, loss of appetite, nausea, light-colored stool, dark yellow or brown urine, yellowing skin or eyes, unusual weakness or fatigue Low blood  pressure--dizziness, feeling faint or lightheaded, blurry vision Lupus-like syndrome--joint pain, swelling, or stiffness, butterfly-shaped rash on the face, rashes that get worse in the sun, fever, unusual weakness or fatigue Seizures Stroke--sudden numbness or weakness of the face, arm, or leg, trouble speaking, confusion, trouble walking, loss of balance or coordination, dizziness, severe headache, change in vision Sudden vision loss in one or both eyes Unusual bruising or bleeding Side effects that usually do not require medical attention (report to your care team if they continue or are bothersome): Cough Diarrhea Fatigue Headache Nausea Runny or stuffy nose Sore throat Stomach pain This list may not describe all possible side effects. Call your doctor for medical advice about side effects. You may report side effects to FDA at 1-800-FDA-1088. Where should I keep my medication? Keep out of the reach of children and pets. See product for storage information. Each product may have different instructions. Get rid of any unused medication after the expiration date. To get rid of medications that are no longer needed or have expired: Take the medication to a medication take-back program. Check with your pharmacy or law enforcement to find a location. If you cannot return the medication, ask your pharmacist or care team how to get rid of this medication safely. NOTE: This sheet is a summary. It may not cover all possible information. If  you have questions about this medicine, talk to your doctor, pharmacist, or health care provider.  2024 Elsevier/Gold Standard (2022-09-02 00:00:00)

## 2023-04-28 ENCOUNTER — Ambulatory Visit: Payer: Medicaid Other | Admitting: *Deleted

## 2023-04-28 ENCOUNTER — Other Ambulatory Visit (INDEPENDENT_AMBULATORY_CARE_PROVIDER_SITE_OTHER): Payer: Medicaid Other

## 2023-04-28 VITALS — BP 134/80 | HR 67 | Wt 289.9 lb

## 2023-04-28 DIAGNOSIS — O0991 Supervision of high risk pregnancy, unspecified, first trimester: Secondary | ICD-10-CM

## 2023-04-28 DIAGNOSIS — O3680X0 Pregnancy with inconclusive fetal viability, not applicable or unspecified: Secondary | ICD-10-CM

## 2023-04-28 DIAGNOSIS — O0990 Supervision of high risk pregnancy, unspecified, unspecified trimester: Secondary | ICD-10-CM

## 2023-04-28 DIAGNOSIS — Z3A08 8 weeks gestation of pregnancy: Secondary | ICD-10-CM

## 2023-04-28 MED ORDER — BLOOD PRESSURE KIT DEVI
1.0000 | 0 refills | Status: AC
Start: 2023-04-28 — End: ?

## 2023-04-28 MED ORDER — PROMETHAZINE HCL 25 MG PO TABS
25.0000 mg | ORAL_TABLET | Freq: Four times a day (QID) | ORAL | 2 refills | Status: AC | PRN
Start: 2023-04-28 — End: ?

## 2023-04-28 NOTE — Patient Instructions (Signed)

## 2023-04-28 NOTE — Progress Notes (Signed)
New OB Intake  I connected with Deanna [name]  on 04/28/23 at  9:15 AM EDT by In Person  Visit and verified that I am speaking with the correct person using two identifiers. Nurse is located at [facility] and pt is located at [place].  I discussed the limitations, risks, security and privacy concerns of performing an evaluation and management service by telephone and the availability of in person appointments. I also discussed with the patient that there may be a patient responsible charge related to this service. The patient expressed understanding and agreed to proceed.  I explained I am completing New OB Intake today. We discussed EDD of 11/27/2023, by Last Menstrual Period. Pt is [id number]. I reviewed her allergies, medications and Medical/Surgical/OB history.    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 Discussed the nature of our practice with multiple providers including residents and students. Due to the size of the practice, the delivering provider may not be the same as those providing prenatal care.   Patient is interested in water birth. Offered upcoming OB visit with CNM to discuss  further.  MyChart/Babyscripts MyChart access verified. I explained pt will have some visits in office and some virtually. Babyscripts instructions given and order placed. Patient verifies receipt of registration text/e-mail. Account successfully created and app downloaded.  Blood Pressure Cuff/Weight Scale Blood pressure cuff ordered for patient to pick-up from [employer]. Explained after first prenatal appt pt will check weekly and document in Babyscripts. Patient does not have weight scale; patient may purchase if they desire to track weight weekly in Babyscripts.  Anatomy US Explained first scheduled [place] will be around 19 weeks. Anatomy US scheduled for TBD at TBD.  Interested in [place]? If yes, send referral and doula dot phrase.   Is patient a candidate for Babyscripts Optimization? No - High risk pregnancy  First visit review I reviewed new OB appt with patient. Explained pt will be seen by Dr. Alysia Penna at first visit. Discussed Avelina Laine genetic screening with patient. Requested Panorama and Horizon.. Routine prenatal labs  collected at today's visit.    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    Harrel Lemon, RN 04/28/2023  10:09 AM

## 2023-05-10 ENCOUNTER — Ambulatory Visit (INDEPENDENT_AMBULATORY_CARE_PROVIDER_SITE_OTHER): Payer: Medicaid Other

## 2023-05-10 VITALS — BP 113/74 | HR 75 | Temp 98.3°F | Resp 20 | Ht 67.0 in | Wt 287.0 lb

## 2023-05-10 DIAGNOSIS — K50813 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: Secondary | ICD-10-CM

## 2023-05-10 MED ORDER — DIPHENHYDRAMINE HCL 25 MG PO CAPS
25.0000 mg | ORAL_CAPSULE | Freq: Once | ORAL | Status: AC
  Administered 2023-05-10: 25 mg via ORAL
  Filled 2023-05-10: qty 1

## 2023-05-10 MED ORDER — METHYLPREDNISOLONE SODIUM SUCC 40 MG IJ SOLR
40.0000 mg | Freq: Once | INTRAMUSCULAR | Status: AC
  Administered 2023-05-10: 40 mg via INTRAVENOUS
  Filled 2023-05-10: qty 1

## 2023-05-10 MED ORDER — ACETAMINOPHEN 325 MG PO TABS
650.0000 mg | ORAL_TABLET | Freq: Once | ORAL | Status: AC
  Administered 2023-05-10: 650 mg via ORAL
  Filled 2023-05-10: qty 2

## 2023-05-10 MED ORDER — SODIUM CHLORIDE 0.9 % IV SOLN
5.0000 mg/kg | Freq: Once | INTRAVENOUS | Status: AC
  Administered 2023-05-10: 700 mg via INTRAVENOUS
  Filled 2023-05-10: qty 70

## 2023-05-10 NOTE — Progress Notes (Signed)
Diagnosis: Crohn's Disease  Provider:  Chilton Greathouse MD  Procedure: IV Infusion  IV Type: Peripheral, IV Location: L Antecubital  Avsola (infliximab-axxq), Dose: 700mg   Infusion Start Time: 1107  Infusion Stop Time: 1319  Post Infusion IV Care: Peripheral IV Discontinued  Discharge: Condition: Good, Destination: Home . AVS Declined  Performed by:  Loney Hering, LPN

## 2023-05-25 ENCOUNTER — Encounter (HOSPITAL_COMMUNITY): Payer: Self-pay | Admitting: Obstetrics and Gynecology

## 2023-05-25 ENCOUNTER — Inpatient Hospital Stay (HOSPITAL_COMMUNITY)
Admission: AD | Admit: 2023-05-25 | Discharge: 2023-05-25 | Disposition: A | Discharge status: Home / Self Care | Payer: Medicaid Other | Attending: Obstetrics and Gynecology | Admitting: Obstetrics and Gynecology

## 2023-05-25 ENCOUNTER — Inpatient Hospital Stay (HOSPITAL_COMMUNITY): Payer: Medicaid Other

## 2023-05-25 DIAGNOSIS — R112 Nausea with vomiting, unspecified: Secondary | ICD-10-CM

## 2023-05-25 DIAGNOSIS — O039 Complete or unspecified spontaneous abortion without complication: Secondary | ICD-10-CM | POA: Insufficient documentation

## 2023-05-25 DIAGNOSIS — O09293 Supervision of pregnancy with other poor reproductive or obstetric history, third trimester: Secondary | ICD-10-CM | POA: Diagnosis not present

## 2023-05-25 DIAGNOSIS — K5090 Crohn's disease, unspecified, without complications: Secondary | ICD-10-CM | POA: Insufficient documentation

## 2023-05-25 DIAGNOSIS — O209 Hemorrhage in early pregnancy, unspecified: Secondary | ICD-10-CM

## 2023-05-25 DIAGNOSIS — Z3A12 12 weeks gestation of pregnancy: Secondary | ICD-10-CM

## 2023-05-25 HISTORY — DX: Unspecified abnormal cytological findings in specimens from vagina: R87.629

## 2023-05-25 LAB — CBC
HCT: 30.6 % — ABNORMAL LOW (ref 36.0–46.0)
Hemoglobin: 10 g/dL — ABNORMAL LOW (ref 12.0–15.0)
MCH: 29.1 pg (ref 26.0–34.0)
MCHC: 32.7 g/dL (ref 30.0–36.0)
MCV: 89 fL (ref 80.0–100.0)
Platelets: 212 10*3/uL (ref 150–400)
RBC: 3.44 MIL/uL — ABNORMAL LOW (ref 3.87–5.11)
RDW: 13.5 % (ref 11.5–15.5)
WBC: 8.6 10*3/uL (ref 4.0–10.5)
nRBC: 0 % (ref 0.0–0.2)

## 2023-05-25 LAB — HCG, QUANTITATIVE, PREGNANCY: hCG, Beta Chain, Quant, S: 891 m[IU]/mL — ABNORMAL HIGH (ref <5)

## 2023-05-25 MED ORDER — LACTATED RINGERS IV BOLUS
1000.0000 mL | Freq: Once | INTRAVENOUS | Status: AC
  Administered 2023-05-25: 1000 mL via INTRAVENOUS

## 2023-05-25 MED ORDER — CYCLOBENZAPRINE HCL 5 MG PO TABS
10.0000 mg | ORAL_TABLET | Freq: Once | ORAL | Status: AC
  Administered 2023-05-25: 10 mg via ORAL
  Filled 2023-05-25: qty 2

## 2023-05-25 MED ORDER — ACETAMINOPHEN-CODEINE 300-30 MG PO TABS
1.0000 | ORAL_TABLET | Freq: Four times a day (QID) | ORAL | 0 refills | Status: AC | PRN
Start: 2023-05-25 — End: ?

## 2023-05-25 MED ORDER — IBUPROFEN 600 MG PO TABS
600.0000 mg | ORAL_TABLET | Freq: Four times a day (QID) | ORAL | 0 refills | Status: AC | PRN
Start: 2023-05-25 — End: ?

## 2023-05-25 MED ORDER — ONDANSETRON 8 MG PO TBDP
8.0000 mg | ORAL_TABLET | Freq: Three times a day (TID) | ORAL | 0 refills | Status: AC | PRN
Start: 2023-05-25 — End: ?

## 2023-05-25 NOTE — MAU Note (Signed)
Lab called to check if needed add'l blood drawn or if HCG level can be done off the blood already drawn, they will add it on , no add'l stick needed.

## 2023-05-25 NOTE — MAU Provider Note (Signed)
History     CSN: [id number]  Arrival date and time: 05/25/23 1454   Event Date/Time   First Provider Initiated Contact with Patient 05/25/23 1604      Chief Complaint  Patient presents with   Vaginal Bleeding   Abdominal Pain   HPI Deanna [name] is a 27 y.o. year old [id number] female at [redacted]w[redacted]d weeks gestation who presents to MAU via EMS reporting syncopal episode. She reports cramping and light pink spotting yesterday. She reports that the bleeding became brown and heavier today. She soaked 4 large pads and passed many large blood clots at 1000 this morning. Her fiance heard a loud noise while she was in the bathroom and went to find her passed out on the toilet. He immediately called 9-1-1. Per EMS, her BP was 78 palpably and pulse was 140. She arrive with a 18G indwelling IV catheter in her LT AC. EMS reports 50 mL NS bolus. Her BP responded to be 140/76 and CBG was 145. She reports a H/A of 5/10, abd pan 5/10 and back pain 6/10. She had an U/S documenting an IUP @ 8.1 wks with (+) HR of 165 bpm on 04/28/2023.   OB History     Gravida  4   Para  2   Term  1   Preterm  1   AB  1   Living  2      SAB  0   IAB  1   Ectopic  0   Multiple  0   Live Births  2        Obstetric Comments  2020 PPROM         Past Medical History:  Diagnosis Date   Anxiety    Arthritis, 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[name])    Crohn disease ([name])    dx in 2012   Depression    Hemorrhoid    Infection    UTI x many   Vaginal Pap smear, abnormal     Past Surgical History:  Procedure Laterality Date   COLONOSCOPY     COLONOSCOPY  2014   [facility]   TONSILLECTOMY      Family History  Problem Relation Age of Onset   Gallstones Mother    Anxiety disorder Mother    Depression Mother    Schizophrenia Father    Eczema Father    Healthy Son    Healthy Son    Stomach cancer Maternal Aunt    Colon cancer Maternal Aunt    Diabetes Maternal Grandmother    Dementia Maternal Grandmother     Arthritis Maternal Grandmother    Diabetes Maternal Grandfather    Heart disease Maternal Grandfather    Stroke Maternal Grandfather    Colon cancer Maternal Grandfather    Colon polyps Neg Hx    Esophageal cancer Neg Hx    Rectal cancer Neg Hx     Social History   Tobacco Use   Smoking status: Former    Current packs/day: 0.00    Types: Cigarettes    Quit date: 10/2022    Years since quitting: 0.5    Passive exposure: Current   Smokeless tobacco: Never   Tobacco comments:    vaping in high school  Vaping Use   Vaping status: Former  Substance Use Topics   Alcohol use: No   Drug use: No    Allergies:  Allergies  Allergen Reactions   Amoxil [Amoxicillin] Anaphylaxis   Vancomycin Dermatitis    Medications  Prior to Admission  Medication Sig Dispense Refill Last Dose   escitalopram (LEXAPRO) 5 MG tablet Take 10 mg by mouth daily.   05/24/2023   inFLIXimab-axxq (AVSOLA) 100 MG injection Inject 600 mg into the vein every 8 (eight) weeks. 1 each 5 Past Month   Melatonin 10 MG TABS Take 10 tablets by mouth daily as needed (sleep).   Past Month   Prenatal Vit-Fe Fumarate-FA (PRENATAL VITAMIN PO) Take 1 tablet by mouth daily.   05/24/2023   promethazine (PHENERGAN) 25 MG tablet Take 1 tablet (25 mg total) by mouth every 6 (six) hours as needed for nausea or vomiting. 30 tablet 2 Past Month   Blood Pressure Monitoring (BLOOD PRESSURE KIT) DEVI 1 Device by Does not apply route once a week. 1 each 0    dicyclomine (BENTYL) 10 MG capsule Take 1 capsule (10 mg total) by mouth every 8 (eight) hours as needed for spasms. (Patient not taking: Reported on 04/28/2023) 60 capsule 1    ondansetron (ZOFRAN) 4 MG tablet Take 1 tablet (4 mg total) by mouth every 8 (eight) hours as needed for nausea or vomiting. (Patient not taking: Reported on 04/14/2023) 20 tablet 0    PROAIR HFA 108 (90 Base) MCG/ACT inhaler INHALE 2 PUFFS INTO THE LUNGS EVERY 4 HOURS AS NEEDED FOR WHEEZING OR SHORTNESS OF  BREATH. (Patient not taking: Reported on 04/28/2023) 8.5 each 3    sertraline (ZOLOFT) 25 MG tablet Take 25 mg by mouth daily. (Patient not taking: Reported on 04/28/2023)       Review of Systems  Constitutional: Negative.   HENT: Negative.    Eyes: Negative.   Respiratory: Negative.    Cardiovascular: Negative.   Gastrointestinal: Negative.   Endocrine: Negative.   Genitourinary:  Positive for pelvic pain and vaginal bleeding (soaked 4 large pads with many blood clots).  Musculoskeletal: Negative.   Skin: Negative.   Allergic/Immunologic: Negative.   Neurological: Negative.   Hematological: Negative.   Psychiatric/Behavioral: Negative.     Physical Exam   Blood pressure 139/82, pulse (!) 125, temperature 97.9 F (36.6 C), temperature source Oral, resp. rate 16, last menstrual period 02/20/2023, SpO2 98%.  Physical Exam Constitutional:      Appearance: Normal appearance. She is obese.  Cardiovascular:     Rate and Rhythm: Tachycardia present.  Pulmonary:     Effort: Pulmonary effort is normal.  Abdominal:     Palpations: Abdomen is soft.  Genitourinary:    General: Normal vulva.     Comments: Pelvic exam: External genitalia normal, SE: vaginal walls pink and well rugated, cervix is smooth, pink, no lesions, copious amt of watery, red bleeding with large orange sized to egg sized blood clots removed from vaginal -- WP, GC/CT done - but not sent to d/t the large amount of bleeding, cervix visually opened -- some ?pregnancy tissue removed from cervical os using Ring forceps.Bimanual exam deferred.  Musculoskeletal:        General: Normal range of motion.  Skin:    General: Skin is warm and dry.  Neurological:     Mental Status: She is alert, oriented to person, place, and time and easily aroused.  Psychiatric:        Mood and Affect: Mood normal.        Behavior: Behavior normal.        Thought Content: Thought content normal.        Judgment: Judgment normal.     MAU  Course  Procedures  MDM CBC LB bolus x 2 liters OB < 14 wks TVUS  Results for orders placed or performed during the hospital encounter of 05/25/23 (from the past 24 hour(s))  CBC     Status: Abnormal   Collection Time: 05/25/23  3:48 PM  Result Value Ref Range   WBC 8.6 4.0 - 10.5 K/uL   RBC 3.44 (L) 3.87 - 5.11 MIL/uL   Hemoglobin 10.0 (L) 12.0 - 15.0 g/dL   HCT 32.4 (L) 40.1 - 02.7 %   MCV 89.0 80.0 - 100.0 fL   MCH 29.1 26.0 - 34.0 pg   MCHC 32.7 30.0 - 36.0 g/dL   RDW 25.3 66.4 - 40.3 %   Platelets 212 150 - 400 K/uL   nRBC 0.0 0.0 - 0.2 %  hCG, quantitative, pregnancy     Status: Abnormal   Collection Time: 05/25/23  3:48 PM  Result Value Ref Range   hCG, Beta Chain, Quant, S 891 (H) <5 mIU/mL   US OB LESS THAN 14 WEEKS WITH OB TRANSVAGINAL  Addendum Date: 05/25/2023   ADDENDUM REPORT: 05/25/2023 19:02 ADDENDUM: This patient did have a prior outside ultrasound showing an 8 week IUP on 04/28/2023. Lack of IUP seen on today's exam is consistent with interval spontaneous abortion. Electronically Signed   By: Danae Orleans M.D.   On: 05/25/2023 19:02   Result Date: 05/25/2023 CLINICAL DATA:  Vaginal bleeding in 1st trimester pregnancy. EXAM: OBSTETRIC <14 WK [place] AND TRANSVAGINAL OB US TECHNIQUE: Both transabdominal and transvaginal ultrasound examinations were performed for complete evaluation of the gestation as well as the maternal uterus, adnexal regions, and pelvic cul-de-sac. Transvaginal technique was performed to assess early pregnancy. COMPARISON:  None Available. FINDINGS: Intrauterine gestational sac: None Maternal uterus/adnexae: Endometrial thickness measures 16 mm. Small amount of fluid or blood noted in endometrial cavity. No fibroids identified. Neither ovary is directly visualized, however no adnexal mass or abnormal free fluid identified. IMPRESSION: Pregnancy of unknown anatomic location (no intrauterine gestational sac or adnexal mass identified). Differential diagnosis  includes recent spontaneous abortion, IUP too early to visualize, and non-visualized ectopic pregnancy. Recommend followup of beta-hCG levels, and follow up US as warranted clinically. Electronically Signed: By: Danae Orleans M.D. On: 05/25/2023 18:23    Assessment and Plan  1. Miscarriage at 8 to [redacted] weeks gestation - Information provided on SAB   2. Vaginal bleeding before [redacted] weeks gestation - Information provided on VB in preg   3. [redacted] weeks gestation of pregnancy  4. Nausea and vomiting, unspecified vomiting type - Rx: Zofran 8 mg ODT every 8 hours prn N/V  - Discharge patient - Keep scheduled f/u appt for repeat HCG at [facility] on Tuesday 9/10/204 @ 1000 - Patient verbalized an understanding of the plan of care and agrees.  Raelyn Mora, CNM 05/25/2023, 4:05 PM

## 2023-05-25 NOTE — Discharge Instructions (Signed)
Return to MAU: If you have heavier bleeding that soaks through more that 2 pads per hour for an hour or more If you bleed so much that you feel like you might pass out or you do pass out If you have significant abdominal pain that is not improved with Tylenol 1000 mg every 8 hours as needed for pain If you develop a fever > 100.5  

## 2023-05-25 NOTE — MAU Note (Signed)
Deanna [name] is a 27 y.o. at [redacted]w[redacted]d here in MAU reporting: pt arrived by EMS at 1447.  Called out after syncopal episode.   Pt started cramping and spotting yesterday, light pink.  Today became bright red and heavy.  Soaked 4 large pads, passing many clots.  Passed out on toilet, fiance heard the fall, went in directly, per ems out- fiance reported pt only out about 10seconds.  Pt was diaphoretic whe EMS arrived.  Pt was sitting up  BP 78palp, pulse of 140.  18gu started in left AC, 50cc NS bolus, pt responded- BP 140/76, cbg was 145.  Onset of complaint: yesterday Pain score: HA 5, abd 5 back 6 Vitals:   05/25/23 1503  BP: 111/71  Pulse: (!) 126  Resp: 20  Temp: 97.9 F (36.6 C)  SpO2: 98%      Lab orders placed from triage:

## 2023-05-25 NOTE — MAU Note (Signed)
Was called into rm, feeling light headed.  Asked for [facility] to be lowered some. Pulse noted up to 130,  HOB lowered, pt reports, feeling passed.  Vs rechecked.

## 2023-05-28 ENCOUNTER — Encounter: Payer: Medicaid Other | Admitting: Obstetrics and Gynecology

## 2023-05-28 ENCOUNTER — Institutional Professional Consult (permissible substitution): Payer: Self-pay | Admitting: Licensed Clinical Social Worker

## 2023-06-01 ENCOUNTER — Other Ambulatory Visit: Payer: Medicaid Other

## 2023-06-01 DIAGNOSIS — O039 Complete or unspecified spontaneous abortion without complication: Secondary | ICD-10-CM

## 2023-06-02 LAB — BETA HCG QUANT (REF LAB): hCG Quant: 134 m[IU]/mL

## 2023-06-04 ENCOUNTER — Telehealth: Payer: Self-pay

## 2023-06-04 NOTE — Telephone Encounter (Signed)
Attempted to contact pt about results and need for appts, no answer, left vm. Notified scheduler to make appt

## 2023-06-07 ENCOUNTER — Encounter: Payer: Self-pay | Admitting: Medical

## 2023-06-07 ENCOUNTER — Ambulatory Visit: Payer: Medicaid Other | Admitting: Medical

## 2023-06-07 ENCOUNTER — Ambulatory Visit (INDEPENDENT_AMBULATORY_CARE_PROVIDER_SITE_OTHER): Payer: Medicaid Other

## 2023-06-07 VITALS — BP 118/75 | HR 92 | Temp 98.5°F | Resp 20 | Ht 67.0 in | Wt 286.0 lb

## 2023-06-07 DIAGNOSIS — K50813 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: Secondary | ICD-10-CM

## 2023-06-07 MED ORDER — ACETAMINOPHEN 325 MG PO TABS
650.0000 mg | ORAL_TABLET | Freq: Once | ORAL | Status: AC
  Administered 2023-06-07: 650 mg via ORAL
  Filled 2023-06-07: qty 2

## 2023-06-07 MED ORDER — SODIUM CHLORIDE 0.9 % IV SOLN
5.0000 mg/kg | Freq: Once | INTRAVENOUS | Status: AC
  Administered 2023-06-07: 600 mg via INTRAVENOUS
  Filled 2023-06-07: qty 60

## 2023-06-07 MED ORDER — DIPHENHYDRAMINE HCL 25 MG PO CAPS
25.0000 mg | ORAL_CAPSULE | Freq: Once | ORAL | Status: AC
  Administered 2023-06-07: 25 mg via ORAL
  Filled 2023-06-07: qty 1

## 2023-06-07 MED ORDER — METHYLPREDNISOLONE SODIUM SUCC 40 MG IJ SOLR
40.0000 mg | Freq: Once | INTRAMUSCULAR | Status: AC
  Administered 2023-06-07: 40 mg via INTRAVENOUS
  Filled 2023-06-07: qty 1

## 2023-06-07 NOTE — Progress Notes (Signed)
Diagnosis: Crohn's Disease  Provider:  Chilton Greathouse MD  Procedure: IV Infusion  IV Type: Peripheral, IV Location: R Antecubital  Avsola (infliximab-axxq), Dose: 600 mg  Infusion Start Time: 1150  Infusion Stop Time: 1400  Post Infusion IV Care: Peripheral IV Discontinued  Discharge: Condition: Good, Destination: Home . AVS Declined  Performed by:  Nat Math, RN

## 2023-06-09 ENCOUNTER — Ambulatory Visit: Payer: Medicaid Other

## 2023-06-18 ENCOUNTER — Other Ambulatory Visit: Payer: Medicaid Other

## 2023-06-18 ENCOUNTER — Ambulatory Visit: Payer: Medicaid Other | Admitting: Obstetrics and Gynecology

## 2023-06-21 ENCOUNTER — Ambulatory Visit: Payer: Medicaid Other | Admitting: Obstetrics and Gynecology

## 2023-06-21 ENCOUNTER — Other Ambulatory Visit: Payer: Medicaid Other

## 2023-07-29 ENCOUNTER — Ambulatory Visit: Payer: Medicaid Other | Admitting: Gastroenterology

## 2023-07-29 ENCOUNTER — Encounter: Payer: Self-pay | Admitting: Gastroenterology

## 2023-07-29 VITALS — BP 118/68 | HR 93 | Ht 67.0 in | Wt 282.0 lb

## 2023-07-29 DIAGNOSIS — Z79899 Other long term (current) drug therapy: Secondary | ICD-10-CM | POA: Diagnosis not present

## 2023-07-29 DIAGNOSIS — K50919 Crohn's disease, unspecified, with unspecified complications: Secondary | ICD-10-CM | POA: Diagnosis not present

## 2023-07-29 DIAGNOSIS — M069 Rheumatoid arthritis, unspecified: Secondary | ICD-10-CM

## 2023-07-29 DIAGNOSIS — Z23 Encounter for immunization: Secondary | ICD-10-CM

## 2023-07-29 NOTE — Patient Instructions (Addendum)
We are giving you a Pneumococcal 20 vaccine today.   We are referring you to [facility].  They will contact you directly to schedule an appointment.  It may take a week or more before you hear from them.  Please feel free to contact us if you have not heard from them within 2 weeks and we will follow up on the referral.   Please go to lab on Monday morning before your Remicade infusion. You will have blood work and be given a kit to collect a stool sample for a fecal calprotectin. Our lab is located in the basement of our building, located at [address]. They are open Monday through Friday from 7:30 am to 5:00 pm. You do not need an appointment  Please follow up in 6 months (May 2025).  Thank you for entrusting me with your care and for choosing [facility], Dr. Ileene Patrick   If your blood pressure at your visit was 140/90 or greater, please contact your primary care physician to follow up on this. ______________________________________________________  If you are age 91 or older, your body mass index should be between 23-30. Your Body mass index is 44.17 kg/m. If this is out of the aforementioned range listed, please consider follow up with your Primary Care Provider.  If you are age 10 or younger, your body mass index should be between 19-25. Your Body mass index is 44.17 kg/m. If this is out of the aformentioned range listed, please consider follow up with your Primary Care Provider.  ________________________________________________________  The [facility] providers would like to encourage you to use [facility] to communicate with providers for non-urgent requests or questions.  Due to long hold times on the telephone, sending your provider a message by [facility] may be a faster and more efficient way to get a response.  Please allow 48 business hours for a response.  Please remember that this is for non-urgent requests.   _______________________________________________________  Due to recent changes in healthcare laws, you may see the results of your imaging and laboratory studies on MyChart before your provider has had a chance to review them.  We understand that in some cases there may be results that are confusing or concerning to you. Not all laboratory results come back in the same time frame and the provider may be waiting for multiple results in order to interpret others.  Please give [place] 48 hours in order for your provider to thoroughly review all the results before contacting the office for clarification of your results.

## 2023-07-29 NOTE — Progress Notes (Signed)
HPI :  27 year old female here for follow-up visit for Crohn's disease.  She was last seen in June   IBD history: Diagnosed 2012 presenting with abdominal pain, bloody diarrhea, 60 pound weight loss; followed at [facility] until 2015; MRE 11/22/18 showed A 7.8 cm segment of thickened and enhancing terminal ileum extends to the cecum, and likely represents moderately inflamed active Crohn's disease, radiologist unable to exclude fistula; no flares during pregnancy x 2  Surgical history: None  Current medications: None Prior medications: Enbrel +MTX combination (2012)-Then lost follow up until 2013; infliximab (last infusion 08/2013); 2015: MTX (25 mg po weekly), Arava 20 mg daily (noncompliance taking medications);  Switched from MTX to SSZ in 08/2014. Her symptoms were well controlled on MTX and Arava.  She remembers the Remicade being the most helpful treatment. Currently on Avsola Last colonoscopy: 07/2020: normal colon, distal TI ulcers consistent with Crohn's Extraintestinal manifestations: JIA (Dr. Corliss Skains)   SINCE LAST VISIT   I last saw the patient in July.  Recall she was previously on Remicade and then transition to Avsola over the past year due to insurance purposes.  Had been doing well and ran out of it last January and we resumed it this past June or so.  CT scan April did not show any significantly active disease, had discussed doing fecal calprotectin but she was never able to get it done.  Recall she was seen last July and told me she was pregnant, her third pregnancy.  We had discussed her medications and we continued Avsola.  She unfortunately tells me she had a miscarriage in August.  She had some blood loss with that and had some anemia, hemoglobin of 10.  She states has been feeling tired and fatigued lately and wonders if she is anemic.  She has continued Avsola and is tolerating it.  She states her Crohn's disease feels like it is well-controlled.  She has solid stools, regular  bowel habits.  She has some occasional cramping in her lower abdomen she uses dicyclomine which helps.  She is eating well, no nausea or vomiting.  She has had a history of rheumatoid arthritis in the past, states her knees and shoulders and back can bother her most of all the joints.  She is also been having some muscle aches.  She states since she resumed of so this is all gotten better and is much more tolerable, she uses Tylenol as needed.  She is requesting a referral to see rheumatology.  Previously seen by Dr. Corliss Skains but she is requesting another opinion.  Otherwise TB testing is up-to-date.  She has not had pneumococcal vaccine we had discussed doing that the last time but she was not able to get it done.  We do have the PCV 20 in the office today and she is interested in pursuing that.  She is also due for a flu shot.  Labs 11/24/2021 showing normal BMP, infliximab drug level 8.0, antiinfliximab antibodies less than 22, fecal calprotectin 87   CT abdomen / pelvis 01/09/23: IMPRESSION: 1. No acute findings in the abdomen or pelvis. 2. Tethered loops of cecum, terminal ileum, and sigmoid colon in the right lower quadrant appear unchanged. No significant associated inflammatory changes.   Past Medical History:  Diagnosis Date   Anxiety    Arthritis, 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[name])    Crohn disease ([name])    dx in 2012   Depression    Hemorrhoid    Infection    UTI x  many   Miscarriage    Vaginal Pap smear, abnormal      Past Surgical History:  Procedure Laterality Date   COLONOSCOPY     COLONOSCOPY  2014   [facility]   TONSILLECTOMY     Family History  Problem Relation Age of Onset   Gallstones Mother    Anxiety disorder Mother    Depression Mother    Schizophrenia Father    Eczema Father    Healthy Son    Healthy Son    Stomach cancer Maternal Aunt    Colon cancer Maternal Aunt    Diabetes Maternal Grandmother    Dementia Maternal Grandmother    Arthritis Maternal  Grandmother    Diabetes Maternal Grandfather    Heart disease Maternal Grandfather    Stroke Maternal Grandfather    Colon cancer Maternal Grandfather    Colon polyps Neg Hx    Esophageal cancer Neg Hx    Rectal cancer Neg Hx    Social History   Tobacco Use   Smoking status: Former    Current packs/day: 0.00    Types: Cigarettes    Quit date: 10/2022    Years since quitting: 0.7    Passive exposure: Current   Smokeless tobacco: Never   Tobacco comments:    vaping in high school  Vaping Use   Vaping status: Former  Substance Use Topics   Alcohol use: No   Drug use: No   Current Outpatient Medications  Medication Sig Dispense Refill   acetaminophen-codeine (TYLENOL #3) 300-30 MG tablet Take 1-2 tablets by mouth every 6 (six) hours as needed for moderate pain. 15 tablet 0   Blood Pressure Monitoring (BLOOD PRESSURE KIT) DEVI 1 Device by Does not apply route once a week. 1 each 0   escitalopram (LEXAPRO) 5 MG tablet Take 10 mg by mouth daily.     ibuprofen (ADVIL) 600 MG tablet Take 1 tablet (600 mg total) by mouth every 6 (six) hours as needed. 60 tablet 0   inFLIXimab-axxq (AVSOLA) 100 MG injection Inject 600 mg into the vein every 8 (eight) weeks. 1 each 5   Melatonin 10 MG TABS Take 10 tablets by mouth daily as needed (sleep).     ondansetron (ZOFRAN-ODT) 8 MG disintegrating tablet Take 1 tablet (8 mg total) by mouth every 8 (eight) hours as needed for nausea or vomiting. 30 tablet 0   Prenatal Vit-Fe Fumarate-FA (PRENATAL VITAMIN PO) Take 1 tablet by mouth daily.     PROAIR HFA 108 (90 Base) MCG/ACT inhaler INHALE 2 PUFFS INTO THE LUNGS EVERY 4 HOURS AS NEEDED FOR WHEEZING OR SHORTNESS OF BREATH. 8.5 each 3   promethazine (PHENERGAN) 25 MG tablet Take 1 tablet (25 mg total) by mouth every 6 (six) hours as needed for nausea or vomiting. 30 tablet 2   No current facility-administered medications for this visit.   Allergies  Allergen Reactions   Amoxil [Amoxicillin]  Anaphylaxis   Vancomycin Dermatitis     Review of Systems: All systems reviewed and negative except where noted in HPI.   Lab Results  Component Value Date   WBC 8.6 05/25/2023   HGB 10.0 (L) 05/25/2023   HCT 30.6 (L) 05/25/2023   MCV 89.0 05/25/2023   PLT 212 05/25/2023    Lab Results  Component Value Date   NA 139 04/28/2023   CL 102 04/28/2023   K 4.2 04/28/2023   CO2 21 04/28/2023   BUN 9 04/28/2023   CREATININE 0.75 04/28/2023  EGFR 113 04/28/2023   CALCIUM 9.4 04/28/2023   ALBUMIN 4.1 04/28/2023   GLUCOSE 81 04/28/2023    Lab Results  Component Value Date   ALT 8 04/28/2023   AST 8 04/28/2023   ALKPHOS 65 04/28/2023   BILITOT <0.2 04/28/2023     Physical Exam: BP 118/68   Pulse 93   Ht 5\' 7"  (1.702 m)   Wt 282 lb (127.9 kg)   BMI 44.17 kg/m  Constitutional: Pleasant,well-developed, female in no acute distress. Neurological: Alert and oriented to person place and time. Psychiatric: Normal mood and affect. Behavior is normal.   ASSESSMENT: 27 y.o. female here for assessment of the following  1. Crohn's disease with complication, unspecified gastrointestinal tract location ([name])   2. Rheumatoid arthritis, involving unspecified site, unspecified whether rheumatoid factor present ([name])   3. High risk medication use    Generally has had a really nice clinical response to resumption of Avsola, after insurance switched her from Remicade to this and she had a lapse in therapy for a few months.  Her Crohn's symptoms appear pretty well-controlled, her joint pain is definitely improved on the regimen but not resolved.  She has some ongoing fatigue which she questions is from anemia, but I recommend follow-up labs to further sort that out.  She is due for her next infusion of Avsola next week.  I will check trough level and antibody titer to see if she needs any dose adjustment and to assess for immunogenicity.  She is agreeable to have that done.  Will also  check CBC and TIBC ferritin at that time to make sure no iron deficiency/anemia.  I also like to have her do a fecal calprotectin to make sure this is normal on her current regimen.  She is agreeable to do that.  We have discussed risks of Avsola.  Recommend pneumococcal vaccine, she is agreeable to have that done today in the office.  After she has recovered from that I recommend she follow-up for flu shot as well and she is agreeable to do that as well.  She is requesting another opinion from rheumatologist regarding arthritis, I will refer her to call rheumatology.  Otherwise follow-up in 6 months, in the interim I will be in touch with her with recommendations pending the results of her labs.  PLAN: - lab on Monday AM PRIOR to her Avsola infusion - check Avsola levels / antibodies, CBC, TIBC / ferritin panel - lab for fecal calprotectin - refer her to [facility] for rheumatoid arthritis - PCV 20 vaccine today - recommend flu shot once recovered from pneumococcal vaccine - follow up in 6 months or sooner with issues  Harlin Rain, MD [facility]

## 2023-08-02 ENCOUNTER — Other Ambulatory Visit (INDEPENDENT_AMBULATORY_CARE_PROVIDER_SITE_OTHER): Payer: Medicaid Other

## 2023-08-02 ENCOUNTER — Ambulatory Visit: Payer: Medicaid Other

## 2023-08-02 VITALS — BP 118/77 | HR 80 | Temp 99.0°F | Resp 20 | Ht 67.0 in | Wt 287.4 lb

## 2023-08-02 DIAGNOSIS — K50919 Crohn's disease, unspecified, with unspecified complications: Secondary | ICD-10-CM | POA: Diagnosis not present

## 2023-08-02 DIAGNOSIS — Z79899 Other long term (current) drug therapy: Secondary | ICD-10-CM

## 2023-08-02 DIAGNOSIS — M069 Rheumatoid arthritis, unspecified: Secondary | ICD-10-CM | POA: Diagnosis not present

## 2023-08-02 DIAGNOSIS — K50813 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: Secondary | ICD-10-CM | POA: Diagnosis not present

## 2023-08-02 LAB — IBC + FERRITIN
Ferritin: 3.2 ng/mL — ABNORMAL LOW (ref 10.0–291.0)
Iron: 17 ug/dL — ABNORMAL LOW (ref 42–145)
Saturation Ratios: 4.4 % — ABNORMAL LOW (ref 20.0–50.0)
TIBC: 385 ug/dL (ref 250.0–450.0)
Transferrin: 275 mg/dL (ref 212.0–360.0)

## 2023-08-02 LAB — CBC WITH DIFFERENTIAL/PLATELET
Basophils Absolute: 0.1 10*3/uL (ref 0.0–0.1)
Basophils Relative: 0.8 % (ref 0.0–3.0)
Eosinophils Absolute: 0.3 10*3/uL (ref 0.0–0.7)
Eosinophils Relative: 4.3 % (ref 0.0–5.0)
HCT: 32.8 % — ABNORMAL LOW (ref 36.0–46.0)
Hemoglobin: 10.4 g/dL — ABNORMAL LOW (ref 12.0–15.0)
Lymphocytes Relative: 38.7 % (ref 12.0–46.0)
Lymphs Abs: 3.1 10*3/uL (ref 0.7–4.0)
MCHC: 31.6 g/dL (ref 30.0–36.0)
MCV: 78.1 fL (ref 78.0–100.0)
Monocytes Absolute: 0.7 10*3/uL (ref 0.1–1.0)
Monocytes Relative: 8.8 % (ref 3.0–12.0)
Neutro Abs: 3.8 10*3/uL (ref 1.4–7.7)
Neutrophils Relative %: 47.4 % (ref 43.0–77.0)
Platelets: 312 10*3/uL (ref 150.0–400.0)
RBC: 4.2 Mil/uL (ref 3.87–5.11)
RDW: 18.7 % — ABNORMAL HIGH (ref 11.5–15.5)
WBC: 8.1 10*3/uL (ref 4.0–10.5)

## 2023-08-02 MED ORDER — ACETAMINOPHEN 325 MG PO TABS
650.0000 mg | ORAL_TABLET | Freq: Once | ORAL | Status: AC
  Administered 2023-08-02: 650 mg via ORAL
  Filled 2023-08-02: qty 2

## 2023-08-02 MED ORDER — METHYLPREDNISOLONE SODIUM SUCC 40 MG IJ SOLR
40.0000 mg | Freq: Once | INTRAMUSCULAR | Status: AC
Start: 2023-08-02 — End: 2023-08-02
  Administered 2023-08-02: 40 mg via INTRAVENOUS
  Filled 2023-08-02: qty 1

## 2023-08-02 MED ORDER — SODIUM CHLORIDE 0.9 % IV SOLN
5.0000 mg/kg | Freq: Once | INTRAVENOUS | Status: AC
  Administered 2023-08-02: 600 mg via INTRAVENOUS
  Filled 2023-08-02: qty 60

## 2023-08-02 MED ORDER — DIPHENHYDRAMINE HCL 25 MG PO CAPS
25.0000 mg | ORAL_CAPSULE | Freq: Once | ORAL | Status: AC
  Administered 2023-08-02: 25 mg via ORAL
  Filled 2023-08-02: qty 1

## 2023-08-02 NOTE — Progress Notes (Signed)
Diagnosis: Crohn's Disease  Provider:  Chilton Greathouse MD  Procedure: IV Infusion  IV Type: Peripheral, IV Location: L Antecubital  Avsola (infliximab-axxq), Dose: 600 mg  Infusion Start Time: 1253  Infusion Stop Time: 1513  Post Infusion IV Care: Patient declined observation and Peripheral IV Discontinued  Discharge: Condition: Stable, Destination: Home . AVS Declined  Performed by:  Wyvonne Lenz, RN

## 2023-08-03 ENCOUNTER — Other Ambulatory Visit: Payer: Medicaid Other

## 2023-08-03 DIAGNOSIS — M069 Rheumatoid arthritis, unspecified: Secondary | ICD-10-CM

## 2023-08-03 DIAGNOSIS — K50919 Crohn's disease, unspecified, with unspecified complications: Secondary | ICD-10-CM

## 2023-08-03 DIAGNOSIS — Z79899 Other long term (current) drug therapy: Secondary | ICD-10-CM

## 2023-08-05 LAB — CALPROTECTIN, FECAL: Calprotectin, Fecal: 157 ug/g — ABNORMAL HIGH (ref 0–120)

## 2023-08-15 LAB — SERIAL MONITORING

## 2023-08-16 LAB — INFLIXIMAB+AB (SERIAL MONITOR)
Anti-Infliximab Antibody: <22 ng/mL
Infliximab Drug Level: 3.3 ug/mL

## 2023-08-26 ENCOUNTER — Telehealth: Payer: Self-pay

## 2023-08-26 ENCOUNTER — Telehealth: Payer: Self-pay | Admitting: Pharmacy Technician

## 2023-08-26 ENCOUNTER — Other Ambulatory Visit: Payer: Self-pay | Admitting: Pharmacy Technician

## 2023-08-26 ENCOUNTER — Telehealth: Payer: Self-pay | Admitting: *Deleted

## 2023-08-26 NOTE — Telephone Encounter (Signed)
Hi Ms.Kim-  Not sure if the message below got lost in translation, so wanted to resend to you to make sure this patient can get increased Avsola dosing.  Thank you for your help!

## 2023-08-26 NOTE — Telephone Encounter (Signed)
To clarify, she was on Avsola 5mg  / kg every 8 weeks with low levels. I want to switch her dose to 10mg /kg every 8 weeks, but to give her next dose a bit sooner (4 weeks from last infusion) as opposed to 8, given her levels were low. After the next dose, she would get 10mg /kg every 8 weeks. Thanks

## 2023-08-26 NOTE — Telephone Encounter (Signed)
Deanna [name],  Our scheduling team is aware to reach out to patient and have her scheduled 4wks from last infusion. For clarifications purposes:  Will she just be getting an extra dose 4wks from last infusion and then resume q8wks dosing?

## 2023-08-26 NOTE — Telephone Encounter (Signed)
Great, thank you!

## 2023-08-26 NOTE — Progress Notes (Signed)
Noted. Updated dose. 10mg /kg q4wks. Patient will be scheduled as soon as possible.  Selena Batten

## 2023-08-26 NOTE — Telephone Encounter (Signed)
Dr. Adela Lank, patient will be scheduled as soon as possible.  Auth Submission: NO AUTH NEEDED Site of care: Site of care: [facility] Payer: [employer] Medication & CPT/J Code(s) submitted: Avsola (infliximab-axxq) [phone] Route of submission (phone, fax, portal):  Phone # Fax # Auth type: Buy/Bill PB Units/visits requested: 10mg /kg x 1 dose Reference number:  Approval from: 08/26/23 to 09/21/23

## 2023-08-26 NOTE — Telephone Encounter (Signed)
Increase in dose.  Auth Submission: NO AUTH NEEDED Site of care: Site of care: [facility] Payer: [employer] med[employer] Medication & CPT/J Code(s) submitted: Avsola (infliximab-axxq) [id number] Route of submission (phone, fax, portal):  Phone # Fax # Auth type: Buy/Bill PB Units/visits requested: 10mg /kg q4wks Reference number:  Approval from: 08/25/24 to 10/22/23

## 2023-08-26 NOTE — Telephone Encounter (Signed)
Benancio Deeds, MD 08/16/2023  7:09 PM EST Back to Top    Thanks Dottie, I sent a message to Amalia Greenhouse in the pharmacy to see if this is possible.  Can you circle back and take a look at her chartin a week or so to make sure that has happened?  Thanks     Richardson Chiquito, RN 08/16/2023  9:33 AM EST     Patient is advised of subtherapeutic Avsola levels without antibodies. Advised that Dr Adela Lank recommends increasing dosage to better control her disease. Patient states that she is agreeable to this.   Will place recall for infliximab levels to be completed around 01/2024.     Willaim Rayas Armbruster, MD11/25/2024  7:41 AM EST     Dottie can you help with the following:   - let patient know her Avolsa levels are SUBTHERAPEUTIC - too low, but without antibodies - I think she would benefit from a dosing increase to maximize her response to the drug. - I believe she is on Avsola 5mg  / kg every 8 weeks. I think she would benefit from switching to 10mg /kg every 8 weeks, and to start this a bit sooner (4 weeks from last infusion) as opposed to 8. I think she would be okay with this if you can let her know and help with coordinating and repeat drug levels in 6 months or so.   Cala Bradford - we would like to increase this patient's dosing from 5mg /kg to 10mg /kg, and try to give the next infusion 4 weeks from her last dose instead of 8 given her levels are low. Can you help process this for her? Thanks

## 2023-08-27 NOTE — Telephone Encounter (Signed)
Patient is scheduled for first Avsola infusion on 08/30/23.

## 2023-08-30 ENCOUNTER — Ambulatory Visit: Payer: Medicaid Other

## 2023-08-30 VITALS — BP 115/74 | HR 67 | Temp 98.2°F | Resp 18 | Ht 67.0 in | Wt 293.0 lb

## 2023-08-30 DIAGNOSIS — K50813 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: Secondary | ICD-10-CM

## 2023-08-30 MED ORDER — DIPHENHYDRAMINE HCL 25 MG PO CAPS
25.0000 mg | ORAL_CAPSULE | Freq: Once | ORAL | Status: AC
Start: 2023-08-30 — End: 2023-08-30
  Administered 2023-08-30: 25 mg via ORAL
  Filled 2023-08-30: qty 1

## 2023-08-30 MED ORDER — ACETAMINOPHEN 325 MG PO TABS
650.0000 mg | ORAL_TABLET | Freq: Once | ORAL | Status: AC
  Administered 2023-08-30: 650 mg via ORAL
  Filled 2023-08-30: qty 2

## 2023-08-30 MED ORDER — SODIUM CHLORIDE 0.9 % IV SOLN
10.0000 mg/kg | Freq: Once | INTRAVENOUS | Status: AC
  Administered 2023-08-30: 1300 mg via INTRAVENOUS
  Filled 2023-08-30: qty 130

## 2023-08-30 MED ORDER — METHYLPREDNISOLONE SODIUM SUCC 40 MG IJ SOLR
40.0000 mg | Freq: Once | INTRAMUSCULAR | Status: AC
Start: 2023-08-30 — End: 2023-08-30
  Administered 2023-08-30: 40 mg via INTRAVENOUS
  Filled 2023-08-30: qty 1

## 2023-08-30 NOTE — Progress Notes (Signed)
Diagnosis: Crohn's Disease  Provider:  Chilton Greathouse MD  Procedure: IV Infusion  IV Type: Peripheral, IV Location: L Forearm  Avsola (infliximab-axxq), Dose: 1300mg   Infusion Start Time: 0941  Infusion Stop Time: 1212  Post Infusion IV Care: Peripheral IV Discontinued  Discharge: Condition: Good, Destination: Home . AVS Declined  Performed by:  Nat Math, RN

## 2023-09-27 ENCOUNTER — Ambulatory Visit: Payer: Medicaid Other

## 2023-09-30 ENCOUNTER — Telehealth: Payer: Self-pay

## 2023-09-30 DIAGNOSIS — Z79899 Other long term (current) drug therapy: Secondary | ICD-10-CM

## 2023-09-30 DIAGNOSIS — K50919 Crohn's disease, unspecified, with unspecified complications: Secondary | ICD-10-CM

## 2023-09-30 NOTE — Telephone Encounter (Signed)
-----   Message from [facility] Marylu Lund H sent at 08/02/2023  3:24 PM EST ----- Regarding: labs due in Jan CBC and TIBC / ferritin in January

## 2023-09-30 NOTE — Telephone Encounter (Signed)
 Orders are in. MyChart message to patient to go to the lab one day next week

## 2023-10-12 ENCOUNTER — Telehealth: Payer: Self-pay

## 2023-10-12 NOTE — Telephone Encounter (Signed)
Auth Submission: NO AUTH NEEDED Site of care: Site of care: [facility] Payer: [employer] Medication & CPT/J Code(s) submitted: Avsola (infliximab-axxq) [id number] Route of submission (phone, fax, portal):  Phone # Fax # Auth type: Buy/Bill PB Units/visits requested: 10mg /kg x 6 doses Reference number:  Approval from: 08/26/23 to 09/20/24

## 2023-10-25 ENCOUNTER — Ambulatory Visit (INDEPENDENT_AMBULATORY_CARE_PROVIDER_SITE_OTHER): Payer: Medicaid Other

## 2023-10-25 VITALS — BP 115/82 | HR 64 | Temp 98.1°F | Resp 16 | Ht 67.0 in | Wt 282.8 lb

## 2023-10-25 DIAGNOSIS — K50813 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: Secondary | ICD-10-CM

## 2023-10-25 MED ORDER — METHYLPREDNISOLONE SODIUM SUCC 40 MG IJ SOLR
40.0000 mg | Freq: Once | INTRAMUSCULAR | Status: AC
  Administered 2023-10-25: 40 mg via INTRAVENOUS
  Filled 2023-10-25: qty 1

## 2023-10-25 MED ORDER — ACETAMINOPHEN 325 MG PO TABS
650.0000 mg | ORAL_TABLET | Freq: Once | ORAL | Status: AC
  Administered 2023-10-25: 650 mg via ORAL
  Filled 2023-10-25: qty 2

## 2023-10-25 MED ORDER — DIPHENHYDRAMINE HCL 25 MG PO CAPS
25.0000 mg | ORAL_CAPSULE | Freq: Once | ORAL | Status: AC
  Administered 2023-10-25: 25 mg via ORAL
  Filled 2023-10-25: qty 1

## 2023-10-25 MED ORDER — SODIUM CHLORIDE 0.9 % IV SOLN
10.0000 mg/kg | Freq: Once | INTRAVENOUS | Status: AC
  Administered 2023-10-25: 1300 mg via INTRAVENOUS
  Filled 2023-10-25: qty 130

## 2023-10-25 NOTE — Progress Notes (Signed)
Diagnosis: Crohn's Disease  Provider:  Chilton Greathouse MD  Procedure: IV Infusion  IV Type: Peripheral, IV Location: L Forearm  Avsola (infliximab-axxq), Dose: 1300mg   Infusion Start Time: 1057  Infusion Stop Time: 1327  Post Infusion IV Care: Peripheral IV Discontinued  Discharge: Condition: Good, Destination: Home . AVS Declined  Performed by:  Garnette Czech, RN

## 2023-12-20 ENCOUNTER — Ambulatory Visit: Payer: Medicaid Other

## 2023-12-20 MED ORDER — ACETAMINOPHEN 325 MG PO TABS: 650.0000 mg | ORAL_TABLET | Freq: Once | ORAL | Status: AC

## 2023-12-20 MED ORDER — METHYLPREDNISOLONE SODIUM SUCC 40 MG IJ SOLR: 40.0000 mg | Freq: Once | INTRAMUSCULAR | Status: AC

## 2023-12-20 MED ORDER — DIPHENHYDRAMINE HCL 25 MG PO CAPS
25.0000 mg | ORAL_CAPSULE | Freq: Once | ORAL | Status: AC
Start: 2023-12-20 — End: ?

## 2023-12-21 ENCOUNTER — Telehealth

## 2023-12-22 NOTE — Progress Notes (Deleted)
 Office Visit Note  Patient: Deanna [name]             Date of Birth: 1995/11/17           MRN: [id number]             PCP: Ollen Bowl, MD Referring: Benancio Deeds, MD Visit Date: 01/05/2024 Occupation: @GUAROCC @  Subjective:  No chief complaint on file.   History of Present Illness: Deanna [name] is a 28 y.o. female with history of polyarthralgia, myofascial pain and Crohn's disease.  She returns today after her last visit in June 2023.  She has been under care of Dr. Orvan Falconer for the Crohn's disease.  She has been on Avsola infusions every 8 weeks.    Activities of Daily Living:  Patient reports morning stiffness for *** {minute/hour:19697}.   Patient {ACTIONS;DENIES/REPORTS:21021675::"Denies"} nocturnal pain.  Difficulty dressing/grooming: {ACTIONS;DENIES/REPORTS:21021675::"Denies"} Difficulty climbing stairs: {ACTIONS;DENIES/REPORTS:21021675::"Denies"} Difficulty getting out of chair: {ACTIONS;DENIES/REPORTS:21021675::"Denies"} Difficulty using hands for taps, buttons, cutlery, and/or writing: {ACTIONS;DENIES/REPORTS:21021675::"Denies"}  No Rheumatology ROS completed.   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    Past Medical History:  Diagnosis Date   Anxiety    Arthritis, 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[name])    Crohn disease ([name])    dx in 2012   Depression    Hemorrhoid    Infection    UTI x many   Miscarriage    Vaginal Pap  smear, abnormal     Family History  Problem Relation Age of Onset   Gallstones Mother    Anxiety disorder Mother    Depression Mother    Schizophrenia Father    Eczema Father    Healthy Son    Healthy Son    Stomach cancer Maternal Aunt    Colon cancer Maternal Aunt    Diabetes Maternal Grandmother    Dementia Maternal Grandmother    Arthritis Maternal Grandmother    Diabetes Maternal Grandfather    Heart disease Maternal Grandfather    Stroke Maternal Grandfather    Colon cancer Maternal Grandfather    Colon polyps Neg Hx    Esophageal cancer Neg Hx    Rectal cancer Neg Hx    Past Surgical History:  Procedure Laterality Date   COLONOSCOPY     COLONOSCOPY  2014   [facility]   TONSILLECTOMY     Social History   Social History Narrative   Not on file   Immunization History  Administered Date(s) Administered   DTaP 10/25/1996, 12/25/1996, 02/23/1997, 01/22/1998, 09/30/2001   HIB (PRP-OMP) 10/25/1996, 12/25/1996, 02/23/1997, 01/22/1998   HPV 9-valent 07/17/2015   HPV Quadrivalent 09/12/2013   Hepatitis A, Ped/Adol-2 Dose 09/12/2013, 07/17/2015   Hepatitis B Sep 10, 1996, 10/25/1996, 05/25/1997   IPV 10/25/1996, 12/25/1996, 08/27/1997, 09/30/2001   Influenza,inj,Quad PF,6+ Mos 09/12/2013, 07/17/2015, 06/11/2016, 07/12/2019   MMR 08/27/1997, 09/30/2001   Meningococcal Conjugate 09/12/2013   PFIZER(Purple Top)SARS-COV-2 Vaccination 12/30/2019, 01/23/2020   PNEUMOCOCCAL CONJUGATE-20 07/29/2023   PPD Test 06/22/2014   Tdap  04/02/2008, 09/01/2016, 07/12/2019, 05/18/2022   Varicella 08/27/1997, 04/02/2008     Objective: Vital Signs: There were no vitals taken for this visit.   Physical Exam   Musculoskeletal Exam: ***  CDAI Exam: CDAI Score: -- Patient Global: --; Provider Global: -- Swollen: --; Tender: -- Joint Exam 01/05/2024   No joint exam has been documented for this visit   There is currently no information documented on the homunculus. Go to the Rheumatology  activity and complete the homunculus joint exam.  Investigation: No additional findings.  Imaging: No results found.  Recent Labs: Lab Results  Component Value Date   WBC 8.1 08/02/2023   HGB 10.4 (L) 08/02/2023   PLT 312.0 08/02/2023   NA 139 04/28/2023   K 4.2 04/28/2023   CL 102 04/28/2023   CO2 21 04/28/2023   GLUCOSE 81 04/28/2023   BUN 9 04/28/2023   CREATININE 0.75 04/28/2023   BILITOT <0.2 04/28/2023   ALKPHOS 65 04/28/2023   AST 8 04/28/2023   ALT 8 04/28/2023   PROT 7.5 04/28/2023   ALBUMIN 4.1 04/28/2023   CALCIUM 9.4 04/28/2023   GFRAA 114 10/20/2019   QFTBGOLDPLUS NEGATIVE 02/25/2023   February 25, 2023 TB Gold negative April 28, 2023 hemoglobin A1c 5.8 August 02, 2023 iron studies ferritin 3.2, iron saturation 4.4, iron 17 August 03, 2023 calprotectin fecal 157  Speciality Comments: No specialty comments available.  Procedures:  No procedures performed Allergies: Amoxil [amoxicillin] and Vancomycin   Assessment / Plan:     Visit Diagnoses: No diagnosis found.  Orders: No orders of the defined types were placed in this encounter.  No orders of the defined types were placed in this encounter.   Face-to-face time spent with patient was *** minutes. Greater than 50% of time was spent in counseling and coordination of care.  Follow-Up Instructions: No follow-ups on file.   Pollyann Savoy, MD  Note - This record has been created using Animal nutritionist.  Chart creation errors have been sought, but may not always  have been located. Such creation errors do not reflect on  the standard of medical care.

## 2023-12-30 ENCOUNTER — Ambulatory Visit: Payer: Self-pay

## 2023-12-30 NOTE — Telephone Encounter (Signed)
 This RN spoke to [place] with [employer], advised he had reached the Pulmonary clinics, provided number for [facility]. No further action needed.  Copied from CRM [id number]. Topic: Clinical - Medication Question >> Dec 30, 2023  8:51 AM Nila Nephew wrote: Reason for CRM: Ronaldo Miyamoto with [employer] is calling to speak to clinial staff to verify the dosage of inFLIXimab-axxq (AVSOLA) 100 MG injection. C/B at [id number]. Ref # [id number] Reason for Disposition . Nursing judgment  Protocols used: No Guideline or Reference Available-A-AH

## 2024-01-05 ENCOUNTER — Ambulatory Visit: Payer: Medicaid Other | Admitting: Rheumatology

## 2024-01-05 DIAGNOSIS — F17200 Nicotine dependence, unspecified, uncomplicated: Secondary | ICD-10-CM

## 2024-01-05 DIAGNOSIS — M24521 Contracture, right elbow: Secondary | ICD-10-CM

## 2024-01-05 DIAGNOSIS — Z840 Family history of diseases of the skin and subcutaneous tissue: Secondary | ICD-10-CM

## 2024-01-05 DIAGNOSIS — F418 Other specified anxiety disorders: Secondary | ICD-10-CM

## 2024-01-05 DIAGNOSIS — K50813 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: Secondary | ICD-10-CM

## 2024-01-05 DIAGNOSIS — M545 Low back pain, unspecified: Secondary | ICD-10-CM

## 2024-01-05 DIAGNOSIS — Z79899 Other long term (current) drug therapy: Secondary | ICD-10-CM

## 2024-01-05 DIAGNOSIS — R5383 Other fatigue: Secondary | ICD-10-CM

## 2024-01-05 DIAGNOSIS — M0880 Other juvenile arthritis, unspecified site: Secondary | ICD-10-CM

## 2024-01-05 DIAGNOSIS — M546 Pain in thoracic spine: Secondary | ICD-10-CM

## 2024-01-05 DIAGNOSIS — M7918 Myalgia, other site: Secondary | ICD-10-CM

## 2024-01-05 DIAGNOSIS — R87613 High grade squamous intraepithelial lesion on cytologic smear of cervix (HGSIL): Secondary | ICD-10-CM

## 2024-01-05 DIAGNOSIS — G8929 Other chronic pain: Secondary | ICD-10-CM

## 2024-01-05 DIAGNOSIS — Z91199 Patient's noncompliance with other medical treatment and regimen due to unspecified reason: Secondary | ICD-10-CM

## 2024-01-05 DIAGNOSIS — L700 Acne vulgaris: Secondary | ICD-10-CM

## 2024-01-05 DIAGNOSIS — M62838 Other muscle spasm: Secondary | ICD-10-CM

## 2024-01-12 ENCOUNTER — Ambulatory Visit (INDEPENDENT_AMBULATORY_CARE_PROVIDER_SITE_OTHER)

## 2024-01-12 VITALS — BP 136/83 | HR 84 | Temp 98.2°F | Resp 18 | Ht 67.0 in | Wt 281.8 lb

## 2024-01-12 DIAGNOSIS — K50813 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: Secondary | ICD-10-CM | POA: Diagnosis not present

## 2024-01-12 MED ORDER — ACETAMINOPHEN 325 MG PO TABS
650.0000 mg | ORAL_TABLET | Freq: Once | ORAL | Status: AC
  Administered 2024-01-12: 650 mg via ORAL
  Filled 2024-01-12: qty 2

## 2024-01-12 MED ORDER — DIPHENHYDRAMINE HCL 25 MG PO CAPS
25.0000 mg | ORAL_CAPSULE | Freq: Once | ORAL | Status: AC
  Administered 2024-01-12: 25 mg via ORAL
  Filled 2024-01-12: qty 1

## 2024-01-12 MED ORDER — METHYLPREDNISOLONE SODIUM SUCC 40 MG IJ SOLR
40.0000 mg | Freq: Once | INTRAMUSCULAR | Status: AC
  Administered 2024-01-12: 40 mg via INTRAVENOUS

## 2024-01-12 MED ORDER — SODIUM CHLORIDE 0.9 % IV SOLN
10.0000 mg/kg | Freq: Once | INTRAVENOUS | Status: AC
  Administered 2024-01-12: 1300 mg via INTRAVENOUS
  Filled 2024-01-12: qty 130

## 2024-01-12 NOTE — Progress Notes (Signed)
 Diagnosis: Crohn's Disease  Provider:  Praveen Mannam MD  Procedure: IV Infusion  IV Type: Peripheral, IV Location: L Antecubital  Avsola  (infliximab -axxq), Dose: 1300mg   Infusion Start Time: 0934  Infusion Stop Time: 1200  Post Infusion IV Care: Peripheral IV Discontinued  Discharge: Condition: Good, Destination: Home . AVS Declined  Performed by:  Kamani Lewter, RN

## 2024-01-18 ENCOUNTER — Telehealth: Payer: Self-pay | Admitting: Gastroenterology

## 2024-01-18 NOTE — Telephone Encounter (Signed)
 Jasmine with [employer] (patient insurance provider) would like to confirm dosage of Avsola  given to patient 08/30/23. Advised that per notes, she received 1300 mg of Avsola  10 mg/kg dosing.

## 2024-01-18 NOTE — Telephone Encounter (Signed)
 I received a call from a rep with [employer] regarding this patient. The rep stated that she would like to know the dosage that was given to this patient for the Avsola  given to the patient on 08/30/2023. A good contact number for them is [phone] with a reference number of [id number]. Please advise.

## 2024-02-02 ENCOUNTER — Ambulatory Visit: Payer: Medicaid Other | Admitting: Rheumatology

## 2024-02-07 ENCOUNTER — Telehealth: Payer: Self-pay | Admitting: *Deleted

## 2024-02-07 DIAGNOSIS — Z79899 Other long term (current) drug therapy: Secondary | ICD-10-CM

## 2024-02-07 DIAGNOSIS — K50919 Crohn's disease, unspecified, with unspecified complications: Secondary | ICD-10-CM

## 2024-02-07 NOTE — Telephone Encounter (Signed)
 Left message reminding patient that she is due for infliximab  labs at our office. She is advised to come around 02/13/24 to our basement floor lab.

## 2024-02-07 NOTE — Telephone Encounter (Signed)
-----   Message from Nurse Dede Fanny sent at 01/21/2024 10:41 AM EDT ----- Needs done around 5/25 ----- Message ----- From: Glennette Lanius, RN Sent: 01/21/2024  12:00 AM EDT To: Glennette Lanius, RN  Pt needs infliximab  levels drawn around 01/30/24. See 08/02/23 lab result note for further info; armbruster pt

## 2024-03-09 ENCOUNTER — Ambulatory Visit

## 2024-03-09 VITALS — BP 117/77 | HR 82 | Temp 98.0°F | Resp 14 | Ht 67.0 in | Wt 282.6 lb

## 2024-03-09 DIAGNOSIS — K50813 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: Secondary | ICD-10-CM | POA: Diagnosis not present

## 2024-03-09 MED ORDER — METHYLPREDNISOLONE SODIUM SUCC 40 MG IJ SOLR
40.0000 mg | Freq: Once | INTRAMUSCULAR | Status: AC
  Administered 2024-03-09: 40 mg via INTRAVENOUS
  Filled 2024-03-09: qty 1

## 2024-03-09 MED ORDER — ACETAMINOPHEN 325 MG PO TABS
650.0000 mg | ORAL_TABLET | Freq: Once | ORAL | Status: AC
  Administered 2024-03-09: 650 mg via ORAL
  Filled 2024-03-09: qty 2

## 2024-03-09 MED ORDER — SODIUM CHLORIDE 0.9 % IV SOLN
10.0000 mg/kg | Freq: Once | INTRAVENOUS | Status: AC
  Administered 2024-03-09: 1300 mg via INTRAVENOUS
  Filled 2024-03-09: qty 130

## 2024-03-09 MED ORDER — DIPHENHYDRAMINE HCL 25 MG PO CAPS
25.0000 mg | ORAL_CAPSULE | Freq: Once | ORAL | Status: AC
  Administered 2024-03-09: 25 mg via ORAL
  Filled 2024-03-09: qty 1

## 2024-03-09 NOTE — Progress Notes (Signed)
 Diagnosis: Crohn's Disease  Provider:  Praveen Mannam MD  Procedure: IV Infusion  IV Type: Peripheral, IV Location: L Forearm  Avsola  (infliximab -axxq), Dose: 300 mg  Infusion Start Time: 1021  Infusion Stop Time: 1155  Post Infusion IV Care: Peripheral IV Discontinued  Discharge: Condition: Good, Destination: Home . AVS Declined  Performed by:  Rachelle Bue, RN

## 2024-05-04 ENCOUNTER — Ambulatory Visit (INDEPENDENT_AMBULATORY_CARE_PROVIDER_SITE_OTHER)

## 2024-05-04 VITALS — BP 120/80 | HR 76 | Temp 97.7°F | Resp 18 | Ht 67.0 in | Wt 282.6 lb

## 2024-05-04 DIAGNOSIS — K50813 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: Secondary | ICD-10-CM | POA: Diagnosis not present

## 2024-05-04 MED ORDER — ACETAMINOPHEN 325 MG PO TABS
650.0000 mg | ORAL_TABLET | Freq: Once | ORAL | Status: AC
  Administered 2024-05-04: 650 mg via ORAL
  Filled 2024-05-04: qty 2

## 2024-05-04 MED ORDER — METHYLPREDNISOLONE SODIUM SUCC 40 MG IJ SOLR
40.0000 mg | Freq: Once | INTRAMUSCULAR | Status: AC
  Administered 2024-05-04: 40 mg via INTRAVENOUS
  Filled 2024-05-04: qty 1

## 2024-05-04 MED ORDER — DIPHENHYDRAMINE HCL 25 MG PO CAPS
25.0000 mg | ORAL_CAPSULE | Freq: Once | ORAL | Status: AC
  Administered 2024-05-04: 25 mg via ORAL
  Filled 2024-05-04: qty 1

## 2024-05-04 MED ORDER — SODIUM CHLORIDE 0.9 % IV SOLN
10.0000 mg/kg | Freq: Once | INTRAVENOUS | Status: AC
  Administered 2024-05-04: 1300 mg via INTRAVENOUS
  Filled 2024-05-04: qty 130

## 2024-05-04 NOTE — Progress Notes (Signed)
 Diagnosis: Crohn's Disease  Provider:  Praveen Mannam MD  Procedure: IV Infusion  IV Type: Peripheral, IV Location: L Antecubital  Avsola (infliximab-axxq), Dose: 1,300 mg  Infusion Start Time: 0927  Infusion Stop Time: 1218  Post Infusion IV Care: Peripheral IV Discontinued  Discharge: Condition: Good, Destination: Home . AVS Declined  Performed by:  Mavrik Bynum, RN

## 2024-06-29 ENCOUNTER — Ambulatory Visit

## 2024-06-29 MED ORDER — DIPHENHYDRAMINE HCL 25 MG PO CAPS: 25.0000 mg | ORAL_CAPSULE | Freq: Once | ORAL | Status: AC

## 2024-06-29 MED ORDER — METHYLPREDNISOLONE SODIUM SUCC 40 MG IJ SOLR: 40.0000 mg | Freq: Once | INTRAMUSCULAR | Status: AC

## 2024-06-29 MED ORDER — ACETAMINOPHEN 325 MG PO TABS: 650.0000 mg | ORAL_TABLET | Freq: Once | ORAL | Status: AC

## 2024-07-11 ENCOUNTER — Telehealth: Admitting: Physician Assistant

## 2024-07-11 DIAGNOSIS — R42 Dizziness and giddiness: Secondary | ICD-10-CM

## 2024-07-11 NOTE — Progress Notes (Signed)
  Because of active dizziness and need for detailed examination, I feel your condition warrants further evaluation and I recommend that you be seen in a face-to-face visit.   NOTE: There will be NO CHARGE for this E-Visit   If you are having a true medical emergency, please call 911.     For an urgent face to face visit, [place] has multiple urgent care centers for your convenience.  Click the link below for the full list of locations and hours, walk-in wait times, appointment scheduling options and driving directions:  Urgent Care - [place], [place], [place], [place], [place], [place]  [place]     Your MyChart E-visit questionnaire answers were reviewed by a board certified advanced clinical practitioner to complete your personal care plan based on your specific symptoms.    Thank you for using e-Visits.

## 2024-07-24 ENCOUNTER — Ambulatory Visit

## 2024-07-24 VITALS — BP 110/74 | HR 64 | Temp 97.6°F | Resp 16 | Ht 67.0 in | Wt 292.8 lb

## 2024-07-24 DIAGNOSIS — K50813 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: Secondary | ICD-10-CM

## 2024-07-24 MED ORDER — ACETAMINOPHEN 325 MG PO TABS
650.0000 mg | ORAL_TABLET | Freq: Once | ORAL | Status: AC
  Administered 2024-07-24: 650 mg via ORAL
  Filled 2024-07-24: qty 2

## 2024-07-24 MED ORDER — SODIUM CHLORIDE 0.9 % IV SOLN
10.0000 mg/kg | Freq: Once | INTRAVENOUS | Status: AC
  Administered 2024-07-24: 1300 mg via INTRAVENOUS
  Filled 2024-07-24: qty 130

## 2024-07-24 MED ORDER — DIPHENHYDRAMINE HCL 25 MG PO CAPS
25.0000 mg | ORAL_CAPSULE | Freq: Once | ORAL | Status: AC
  Administered 2024-07-24: 25 mg via ORAL
  Filled 2024-07-24: qty 1

## 2024-07-24 MED ORDER — METHYLPREDNISOLONE SODIUM SUCC 40 MG IJ SOLR
40.0000 mg | Freq: Once | INTRAMUSCULAR | Status: AC
  Administered 2024-07-24: 40 mg via INTRAVENOUS
  Filled 2024-07-24: qty 1

## 2024-07-24 NOTE — Progress Notes (Signed)
 Diagnosis: Crohn's Disease  Provider:  Praveen Mannam MD  Procedure: IV Infusion  IV Type: Peripheral, IV Location: L Forearm  Avsola  (infliximab -axxq), Dose: 1300mg   Infusion Start Time: 1345  Infusion Stop Time: 1558  Post Infusion IV Care: Peripheral IV Discontinued  Discharge: Condition: Good, Destination: Home . AVS Declined  Performed by:  Leita FORBES Miles, LPN

## 2024-09-03 ENCOUNTER — Telehealth: Admitting: Family Medicine

## 2024-09-03 DIAGNOSIS — R21 Rash and other nonspecific skin eruption: Secondary | ICD-10-CM

## 2024-09-03 MED ORDER — PREDNISONE 10 MG (21) PO TBPK: ORAL_TABLET | ORAL | 0 refills | Status: AC

## 2024-09-03 MED ORDER — FLUCONAZOLE 150 MG PO TABS: 150.0000 mg | ORAL_TABLET | Freq: Every day | ORAL | 0 refills | Status: AC

## 2024-09-03 NOTE — Progress Notes (Signed)
 Virtual Visit Consent   Deanna [name], you are scheduled for a virtual visit with a  provider today. Just as with appointments in the office, your consent must be obtained to participate. Your consent will be active for this visit and any virtual visit you may have with one of our providers in the next 365 days. If you have a MyChart account, a copy of this consent can be sent to you electronically.  As this is a virtual visit, video technology does not allow for your provider to perform a traditional examination. This may limit your provider's ability to fully assess your condition. If your provider identifies any concerns that need to be evaluated in person or the need to arrange testing (such as labs, EKG, etc.), we will make arrangements to do so. Although advances in technology are sophisticated, we cannot ensure that it will always work on either your end or our end. If the connection with a video visit is poor, the visit may have to be switched to a telephone visit. With either a video or telephone visit, we are not always able to ensure that we have a secure connection.  By engaging in this virtual visit, you consent to the provision of healthcare and authorize for your insurance to be billed (if applicable) for the services provided during this visit. Depending on your insurance coverage, you may receive a charge related to this service.  I need to obtain your verbal consent now. Are you willing to proceed with your visit today? Deanna [name] has provided verbal consent on 09/03/2024 for a virtual visit (video or telephone). Deanna Lamp, FNP  Date: 09/03/2024 10:52 AM   Virtual Visit via Video Note   I, Deanna [name], connected with  Deanna [name]  ([id number], December 08, 1995) on 09/03/2024 at 10:45 AM EST by a video-enabled telemedicine application and verified that I am speaking with the correct person using two identifiers.  Location: Patient: Virtual Visit Location  Patient: Home Provider: Virtual Visit Location Provider: Home Office   I discussed the limitations of evaluation and management by telemedicine and the availability of in person appointments. The patient expressed understanding and agreed to proceed.    History of Present Illness: Deanna [name] is a 28 y.o. who identifies as a female who was assigned female at birth, and is being seen today for rash on entire body that itches. Raised in areas and circular. She washed everything and it continues. She thought it was from her pets but it persists. 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  Resting comfortably  at home.  Head is normocephalic, atraumatic.  No labored breathing.  Speech is clear and coherent with logical content.  Patient is alert and oriented at baseline.    Assessment and Plan: 1. Rash (Primary)  Keep clean and dry, UC if  sx persist or worsen.   Follow Up Instructions: I discussed the assessment and treatment plan with the patient. The patient was provided an opportunity to ask questions and all were answered. The patient agreed with the plan and demonstrated an understanding of the instructions.  A copy of instructions were sent to the patient via MyChart unless otherwise noted  below.     The patient was advised to call back or seek an in-person evaluation if the symptoms worsen or if the condition fails to improve as anticipated.    Deanna Looper, FNP     [1]  Allergies Allergen Reactions   Amoxil  [Amoxicillin ] Anaphylaxis   Vancomycin  Dermatitis  [2]  Current Outpatient Medications:    fluconazole  (DIFLUCAN ) 150 MG tablet, Take 1 tablet (150 mg total) by mouth daily for 3 days. Take one today and repeat every 3 days for 3 doses total., Disp: 3 tablet, Rfl: 0   predniSONE  (STERAPRED UNI-PAK 21 TAB) 10 MG (21) TBPK tablet, Prednisone  10 mg dose pack as directed- 6 days- no refills to take as directed., Disp: 1 each, Rfl: 0   acetaminophen -codeine  (TYLENOL  #3) 300-30 MG tablet, Take 1-2 tablets by mouth every 6 (six) hours as needed for moderate pain., Disp: 15 tablet, Rfl: 0   Blood Pressure Monitoring (BLOOD PRESSURE KIT) DEVI, 1 Device by Does not apply route once a week., Disp: 1 each, Rfl: 0   escitalopram (LEXAPRO) 5 MG tablet, Take 10 mg by mouth daily., Disp: , Rfl:    ibuprofen  (ADVIL ) 600 MG tablet, Take 1 tablet (600 mg total) by mouth every 6 (six) hours as needed., Disp: 60 tablet, Rfl: 0   inFLIXimab -axxq (AVSOLA ) 100 MG injection, Inject 600 mg into the vein every 8 (eight) weeks., Disp: 1 each, Rfl: 5   Melatonin 10 MG TABS, Take 10 tablets by mouth daily as needed (sleep)., Disp: , Rfl:    ondansetron  (ZOFRAN -ODT) 8 MG disintegrating tablet, Take 1 tablet (8 mg total) by mouth every 8 (eight) hours as needed for nausea or vomiting., Disp: 30 tablet, Rfl: 0   Prenatal Vit-Fe Fumarate-FA (PRENATAL VITAMIN PO), Take 1 tablet by mouth daily., Disp: , Rfl:    PROAIR  HFA 108 (90 Base) MCG/ACT inhaler, INHALE 2 PUFFS INTO THE LUNGS EVERY 4 HOURS AS NEEDED FOR WHEEZING OR SHORTNESS OF BREATH., Disp: 8.5 each, Rfl: 3   promethazine  (PHENERGAN ) 25 MG tablet, Take 1 tablet (25 mg total) by mouth every 6 (six) hours as needed for nausea or vomiting., Disp: 30  tablet, Rfl: 2 No current facility-administered medications for this visit.  Facility-Administered Medications Ordered in Other Visits:    acetaminophen  (TYLENOL ) tablet 650 mg, 650 mg, Oral, Once, Deanna Holms, [facility]   acetaminophen  (TYLENOL ) tablet 650 mg, 650 mg, Oral, Once, Deanna Holms, [facility]   diphenhydrAMINE  (BENADRYL ) capsule 25 mg, 25 mg, Oral, Once, Deanna Holms, [facility]   diphenhydrAMINE  (BENADRYL ) capsule 25 mg, 25 mg, Oral, Once, Patel, [place], [facility]   methylPREDNISolone  sodium succinate (SOLU-MEDROL ) 40 mg/mL injection 40 mg, 40 mg, Intravenous, Once, Patel, [place], [facility]   methylPREDNISolone  sodium succinate (SOLU-MEDROL ) 40 mg/mL injection 40 mg, 40 mg, Intravenous, Once, Deanna Yatin, [facility]

## 2024-09-03 NOTE — Patient Instructions (Signed)
 Rash, Adult A rash is a breakout of spots or blotches on the skin. It can affect the way the skin looks and feels. Many things can cause a rash. Common causes include: Viral infections. These include colds, measles, and hand, foot, and mouth disease. Bacterial infections. These include scarlet fever and impetigo. Fungal infections. These include athlete's foot, ringworm, and yeast rashes. Skin irritation. This may be from heat rash, exposure to moisture or friction for a long time (intertrigo), or exposure to soap or skin care products (eczema). Allergic reactions. These may be caused by foods, medicines, or things like poison ivy. Some rashes may go away after a few days. Others may last for a few weeks. The goal of treatment is to stop the itching and keep the rash from spreading. Follow these instructions at home: Medicine Take or apply over-the-counter and prescription medicines only as told by your health care provider. These may include: Corticosteroids. These can help treat red or swollen skin. They may be given as creams or as medicines to take by mouth (orally). Anti-itch lotions. Allergy medicines. Pain medicine. Antifungal medicine if the rash is from a fungal infection. Antibiotics if you have an infection.  Skin care Apply cool, wet cloths (compresses) to the affected areas. Do not scratch or rub your skin. Avoid covering the rash. Keep it exposed to air as often as you can. Managing itching and discomfort Avoid hot showers and baths. These can make itching worse. A cold shower may help. Try taking a bath with: Epsom salts. You can get these at your local pharmacy or grocery store. Follow the instructions on the package. Baking soda. Pour a small amount into the bath as told by your provider. Colloidal oatmeal. You can get this at your local pharmacy or grocery store. Follow the instructions on the package. Try putting baking soda paste on your skin. Stir water into baking  soda until it becomes like a paste. Try using calamine lotion or cortisone cream to help with itchiness. Keep cool. Stay out of the sun. Sweating and being hot can make itching worse. General instructions  Rest as needed. Drink enough fluid to keep your pee (urine) pale yellow. Wear loose-fitting clothes. Avoid scented soaps, detergents, and perfumes. Use gentle soaps, detergents, perfumes, and cosmetics. Avoid the things that cause your rash (triggers). Keep a journal to help keep track of your triggers. Write down: What you eat. What cosmetics you use. What you drink. What you wear. This includes jewelry. Contact a health care provider if: You sweat at night more than normal. You pee (urinate) more or less than normal, or your pee is a darker color than normal. Your eyes become sensitive to light. Your skin or the white parts of your eyes turn yellow (jaundice). Your skin tingles or is numb. You get painful blisters in your nose or mouth. Your rash does not go away after a few days, or it gets worse. You are more tired or thirsty than normal. You have new or worse symptoms. These may include: Pain in your abdomen. Fever. Diarrhea or vomiting. Weakness or weight loss. Get help right away if: You get confused. You have a severe headache, a stiff neck, or severe joint pain or stiffness. You become very sleepy or not responsive. You have a seizure. This information is not intended to replace advice given to you by your health care provider. Make sure you discuss any questions you have with your health care provider. Document Revised: 06/26/2022 Document Reviewed:  06/26/2022 Elsevier Patient Education  2024 [employer].

## 2024-09-18 ENCOUNTER — Ambulatory Visit

## 2024-09-18 VITALS — BP 123/79 | HR 67 | Temp 98.1°F | Resp 18 | Ht 67.0 in | Wt 292.0 lb

## 2024-09-18 DIAGNOSIS — K50813 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: Secondary | ICD-10-CM

## 2024-09-18 MED ORDER — ACETAMINOPHEN 325 MG PO TABS
650.0000 mg | ORAL_TABLET | Freq: Once | ORAL | Status: AC
  Administered 2024-09-18: 650 mg via ORAL
  Filled 2024-09-18: qty 2

## 2024-09-18 MED ORDER — DIPHENHYDRAMINE HCL 25 MG PO CAPS
25.0000 mg | ORAL_CAPSULE | Freq: Once | ORAL | Status: AC
  Administered 2024-09-18: 25 mg via ORAL
  Filled 2024-09-18: qty 1

## 2024-09-18 MED ORDER — SODIUM CHLORIDE 0.9 % IV SOLN
10.0000 mg/kg | Freq: Once | INTRAVENOUS | Status: AC
  Administered 2024-09-18: 1300 mg via INTRAVENOUS
  Filled 2024-09-18: qty 130

## 2024-09-18 MED ORDER — METHYLPREDNISOLONE SODIUM SUCC 40 MG IJ SOLR
40.0000 mg | Freq: Once | INTRAMUSCULAR | Status: AC
  Administered 2024-09-18: 40 mg via INTRAVENOUS
  Filled 2024-09-18: qty 1

## 2024-09-18 NOTE — Progress Notes (Signed)
 Diagnosis: , Crohn's Disease  Provider:  Praveen Mannam MD  Procedure: IV Infusion  IV Type: Peripheral, IV Location: L Forearm  , Avsola  (infliximab -axxq), Dose: 1300mg   Infusion Start Time: 1405  Infusion Stop Time: 1625  Post Infusion IV Care: Peripheral IV Discontinued  Discharge: Condition: Good, Destination: Home . AVS Declined  Performed by:  Pranav Lince, RN

## 2024-09-21 ENCOUNTER — Telehealth: Payer: Self-pay

## 2024-09-21 NOTE — Telephone Encounter (Signed)
 Auth Submission: NO AUTH NEEDED Site of care: Site of care: [facility] Payer: [employer] Medication & CPT/J Code(s) submitted: Avsola  (infliximab -axxq) [phone] Route of submission (phone, fax, portal):  Phone # Fax # Auth type: Buy/Bill PB Units/visits requested: 10mg /kg x 6 doses Reference number:  Approval from: 09/21/24 to 09/20/25

## 2024-10-12 ENCOUNTER — Telehealth: Admitting: Family

## 2024-10-12 ENCOUNTER — Telehealth: Admitting: Nurse Practitioner

## 2024-10-12 DIAGNOSIS — J0110 Acute frontal sinusitis, unspecified: Secondary | ICD-10-CM

## 2024-10-12 DIAGNOSIS — R0789 Other chest pain: Secondary | ICD-10-CM

## 2024-10-12 MED ORDER — PROMETHAZINE-DM 6.25-15 MG/5ML PO SYRP: 5.0000 mL | ORAL_SOLUTION | Freq: Three times a day (TID) | ORAL | 0 refills | Status: AC | PRN

## 2024-10-12 MED ORDER — BENZONATATE 200 MG PO CAPS: 200.0000 mg | ORAL_CAPSULE | Freq: Two times a day (BID) | ORAL | 0 refills | Status: AC | PRN

## 2024-10-12 MED ORDER — DOXYCYCLINE HYCLATE 100 MG PO TABS: 100.0000 mg | ORAL_TABLET | Freq: Two times a day (BID) | ORAL | 0 refills | Status: AC

## 2024-10-12 MED ORDER — ALBUTEROL SULFATE HFA 108 (90 BASE) MCG/ACT IN AERS: 2.0000 | INHALATION_SPRAY | Freq: Four times a day (QID) | RESPIRATORY_TRACT | 0 refills | Status: AC | PRN

## 2024-10-12 NOTE — Progress Notes (Signed)
 " Virtual Visit Consent   Deanna [name], you are scheduled for a virtual visit with a [facility] provider today. Just as with appointments in the office, your consent must be obtained to participate. Your consent will be active for this visit and any virtual visit you may have with one of our providers in the next 365 days. If you have a MyChart account, a copy of this consent can be sent to you electronically.  As this is a virtual visit, video technology does not allow for your provider to perform a traditional examination. This may limit your provider's ability to fully assess your condition. If your provider identifies any concerns that need to be evaluated in person or the need to arrange testing (such as labs, EKG, etc.), we will make arrangements to do so. Although advances in technology are sophisticated, we cannot ensure that it will always work on either your end or our end. If the connection with a video visit is poor, the visit may have to be switched to a telephone visit. With either a video or telephone visit, we are not always able to ensure that we have a secure connection.  By engaging in this virtual visit, you consent to the provision of healthcare and authorize for your insurance to be billed (if applicable) for the services provided during this visit. Depending on your insurance coverage, you may receive a charge related to this service.  I need to obtain your verbal consent now. Are you willing to proceed with your visit today? Deanna [name] has provided verbal consent on 10/12/2024 for a virtual visit (video or telephone). Deanna Learn, FNP  Date: 10/12/2024 5:15 PM   Virtual Visit via Video Note   I, Deanna [name], connected with  Deanna [name]  ([id number], January 18, 1996) on 10/12/24 at  5:15 PM EST by a video-enabled telemedicine application and verified that I am speaking with the correct person using two identifiers.  Location: Patient: Virtual Visit Location  Patient: Home Provider: Virtual Visit Location Provider: Home Office   I discussed the limitations of evaluation and management by telemedicine and the availability of in person appointments. The patient expressed understanding and agreed to proceed.    History of Present Illness: Deanna [name] is a 29 y.o. who identifies as a female who was assigned female at birth, and is being seen today for cough, sinus congestion , sore throat, and headache that started a week ago.  HPI: URI  This is a new problem. The current episode started 1 to 4 weeks ago. The problem has been gradually worsening. There has been no fever. Associated symptoms include congestion, coughing, ear pain (right), headaches, joint pain, rhinorrhea, sinus pain, sneezing and a sore throat. She has tried antihistamine and decongestant for the symptoms. The treatment provided mild relief.    Problems:  Patient Active Problem List   Diagnosis Date Noted   Long COVID 01/29/2021   Obesity in pregnancy 09/06/2019   H/O rapid labor 09/06/2019   History of prior pregnancy with intrauterine growth restricted newborn 06/25/2019   HGSIL (high grade squamous intraepithelial lesion) on Pap smear of cervix 04/12/2019   Depression with anxiety 10/27/2018   Exposure to STD 07/07/2018   Keloid 09/12/2013   Acne vulgaris 09/12/2013   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[name]) 09/30/2012   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[name]) 08/09/2012    Allergies: Allergies[1] Medications: Current Medications[2]  Observations/Objective: Patient is well-developed, well-nourished in no acute distress.  Resting comfortably  at home.  Head is normocephalic, atraumatic.  No labored breathing.  Speech is clear and coherent with logical content.  Patient is alert and oriented at baseline.  Frontal sinus pressure Congestion and dry cough  Assessment and Plan: 1. Acute non-recurrent frontal sinusitis (Primary) -  doxycycline  (VIBRA -TABS) 100 MG tablet; Take 1 tablet (100 mg total) by mouth 2 (two) times daily.  Dispense: 20 tablet; Refill: 0 - benzonatate  (TESSALON ) 200 MG capsule; Take 1 capsule (200 mg total) by mouth 2 (two) times daily as needed for cough.  Dispense: 20 capsule; Refill: 0 - promethazine -dextromethorphan (PROMETHAZINE -DM) 6.25-15 MG/5ML syrup; Take 5 mLs by mouth 3 (three) times daily as needed for cough.  Dispense: 118 mL; Refill: 0 - albuterol  (VENTOLIN  HFA) 108 (90 Base) MCG/ACT inhaler; Inhale 2 puffs into the lungs every 6 (six) hours as needed for wheezing or shortness of breath.  Dispense: 8 g; Refill: 0  - Take meds as prescribed - Use a cool mist humidifier  -Use saline nose sprays frequently -Force fluids -For any cough or congestion  Use plain Mucinex - regular strength or max strength is fine -For fever or aces or pains- take tylenol  or ibuprofen . -Throat lozenges if help -Follow up if symptoms worsen or do not improve   Follow Up Instructions: I discussed the assessment and treatment plan with the patient. The patient was provided an opportunity to ask questions and all were answered. The patient agreed with the plan and demonstrated an understanding of the instructions.  A copy of instructions were sent to the patient via MyChart unless otherwise noted below.     The patient was advised to call back or seek an in-person evaluation if the symptoms worsen or if the condition fails to improve as anticipated.    Deanna Learn, FNP    [1]  Allergies Allergen Reactions   Amoxil  [Amoxicillin ] Anaphylaxis   Vancomycin  Dermatitis  [2]  Current Outpatient Medications:    albuterol  (VENTOLIN  HFA) 108 (90 Base) MCG/ACT inhaler, Inhale 2 puffs into the lungs every 6 (six) hours as needed for wheezing or shortness of breath., Disp: 8 g, Rfl: 0   benzonatate  (TESSALON ) 200 MG capsule, Take 1 capsule (200 mg total) by mouth 2 (two) times daily as needed for cough., Disp: 20  capsule, Rfl: 0   doxycycline  (VIBRA -TABS) 100 MG tablet, Take 1 tablet (100 mg total) by mouth 2 (two) times daily., Disp: 20 tablet, Rfl: 0   promethazine -dextromethorphan (PROMETHAZINE -DM) 6.25-15 MG/5ML syrup, Take 5 mLs by mouth 3 (three) times daily as needed for cough., Disp: 118 mL, Rfl: 0   acetaminophen -codeine  (TYLENOL  #3) 300-30 MG tablet, Take 1-2 tablets by mouth every 6 (six) hours as needed for moderate pain., Disp: 15 tablet, Rfl: 0   Blood Pressure Monitoring (BLOOD PRESSURE KIT) DEVI, 1 Device by Does not apply route once a week., Disp: 1 each, Rfl: 0   escitalopram (LEXAPRO) 5 MG tablet, Take 10 mg by mouth daily., Disp: , Rfl:    ibuprofen  (ADVIL ) 600 MG tablet, Take 1 tablet (600 mg total) by mouth every 6 (six) hours as needed., Disp: 60 tablet, Rfl: 0   inFLIXimab -axxq (AVSOLA ) 100 MG injection, Inject 600 mg into the vein every 8 (eight) weeks., Disp: 1 each, Rfl: 5   Melatonin 10 MG TABS, Take 10 tablets by mouth daily as needed (sleep)., Disp: , Rfl:    ondansetron  (ZOFRAN -ODT) 8 MG disintegrating tablet, Take 1 tablet (8 mg total) by mouth every 8 (eight)  hours as needed for nausea or vomiting., Disp: 30 tablet, Rfl: 0   predniSONE  (STERAPRED UNI-PAK 21 TAB) 10 MG (21) TBPK tablet, Prednisone  10 mg dose pack as directed- 6 days- no refills to take as directed., Disp: 1 each, Rfl: 0   Prenatal Vit-Fe Fumarate-FA (PRENATAL VITAMIN PO), Take 1 tablet by mouth daily., Disp: , Rfl:    promethazine  (PHENERGAN ) 25 MG tablet, Take 1 tablet (25 mg total) by mouth every 6 (six) hours as needed for nausea or vomiting., Disp: 30 tablet, Rfl: 2 No current facility-administered medications for this visit.  Facility-Administered Medications Ordered in Other Visits:    acetaminophen  (TYLENOL ) tablet 650 mg, 650 mg, Oral, Once, Tobie Holms, RPH   acetaminophen  (TYLENOL ) tablet 650 mg, 650 mg, Oral, Once, Tobie Holms, RPH   diphenhydrAMINE  (BENADRYL ) capsule 25 mg, 25 mg, Oral, Once,  Patel, [place], [facility]   diphenhydrAMINE  (BENADRYL ) capsule 25 mg, 25 mg, Oral, Once, Patel, [place], RPH   methylPREDNISolone  sodium succinate (SOLU-MEDROL ) 40 mg/mL injection 40 mg, 40 mg, Intravenous, Once, Patel, [place], [facility]   methylPREDNISolone  sodium succinate (SOLU-MEDROL ) 40 mg/mL injection 40 mg, 40 mg, Intravenous, Once, Patel, [place], [facility]  "

## 2024-10-12 NOTE — Progress Notes (Signed)
" °  Because of the shortness of breath with chest pain you are experiencing and having to take short shallow breaths, I feel your condition warrants further evaluation and I recommend that you be seen in a face-to-face visit.   NOTE: There will be NO CHARGE for this E-Visit   If you are having a true medical emergency, please call 911.     For an urgent face to face visit, [facility] has multiple urgent care centers for your convenience.  Click the link below for the full list of locations and hours, walk-in wait times, appointment scheduling options and driving directions:  Urgent Care - [place], [place], [place], [place], [place], [place]  [place]     Your MyChart E-visit questionnaire answers were reviewed by a board certified advanced clinical practitioner to complete your personal care plan based on your specific symptoms.    Thank you for using e-Visits.    "

## 2024-11-13 ENCOUNTER — Ambulatory Visit
